# Patient Record
Sex: Female | Born: 1955 | Race: White | Hispanic: No | Marital: Married | Smoking: Never smoker
Health system: Southern US, Community
[De-identification: ages and names within clinical notes are randomized; demographics above are authoritative.]

## PROBLEM LIST (undated history)

## (undated) DIAGNOSIS — I7300 Raynaud's syndrome without gangrene: Secondary | ICD-10-CM

## (undated) DIAGNOSIS — I10 Essential (primary) hypertension: Secondary | ICD-10-CM

## (undated) DIAGNOSIS — R112 Nausea with vomiting, unspecified: Secondary | ICD-10-CM

## (undated) DIAGNOSIS — R000 Tachycardia, unspecified: Secondary | ICD-10-CM

## (undated) DIAGNOSIS — I341 Nonrheumatic mitral (valve) prolapse: Secondary | ICD-10-CM

## (undated) DIAGNOSIS — K220 Achalasia of cardia: Secondary | ICD-10-CM

## (undated) DIAGNOSIS — Z9889 Other specified postprocedural states: Secondary | ICD-10-CM

## (undated) DIAGNOSIS — B029 Zoster without complications: Secondary | ICD-10-CM

## (undated) DIAGNOSIS — E785 Hyperlipidemia, unspecified: Secondary | ICD-10-CM

## (undated) DIAGNOSIS — N281 Cyst of kidney, acquired: Secondary | ICD-10-CM

## (undated) DIAGNOSIS — H33321 Round hole, right eye: Secondary | ICD-10-CM

## (undated) HISTORY — DX: Achalasia of cardia: K22.0

## (undated) HISTORY — PX: SALPINGECTOMY: SHX328

## (undated) HISTORY — PX: EXPLORATORY LAPAROTOMY: SUR591

## (undated) HISTORY — PX: TUBAL LIGATION: SHX77

## (undated) HISTORY — DX: Hyperlipidemia, unspecified: E78.5

## (undated) HISTORY — DX: Zoster without complications: B02.9

## (undated) HISTORY — DX: Nonrheumatic mitral (valve) prolapse: I34.1

## (undated) HISTORY — PX: DILATION AND CURETTAGE OF UTERUS: SHX78

## (undated) HISTORY — DX: Essential (primary) hypertension: I10

---

## 1981-12-18 DIAGNOSIS — K220 Achalasia of cardia: Secondary | ICD-10-CM

## 1981-12-18 HISTORY — DX: Achalasia of cardia: K22.0

## 2005-11-15 ENCOUNTER — Encounter: Payer: Self-pay | Admitting: Internal Medicine

## 2005-11-29 ENCOUNTER — Encounter: Payer: Self-pay | Admitting: Internal Medicine

## 2005-12-18 HISTORY — PX: COLONOSCOPY: SHX174

## 2006-01-30 LAB — HM COLONOSCOPY: HM COLON: NORMAL

## 2009-02-11 ENCOUNTER — Encounter: Payer: Self-pay | Admitting: Internal Medicine

## 2009-04-08 ENCOUNTER — Encounter: Payer: Self-pay | Admitting: Internal Medicine

## 2009-08-10 ENCOUNTER — Encounter (INDEPENDENT_AMBULATORY_CARE_PROVIDER_SITE_OTHER): Payer: Self-pay | Admitting: *Deleted

## 2009-08-17 ENCOUNTER — Ambulatory Visit: Payer: Self-pay | Admitting: Internal Medicine

## 2009-08-17 DIAGNOSIS — M25559 Pain in unspecified hip: Secondary | ICD-10-CM | POA: Insufficient documentation

## 2009-08-17 DIAGNOSIS — N949 Unspecified condition associated with female genital organs and menstrual cycle: Secondary | ICD-10-CM

## 2009-08-17 DIAGNOSIS — M2550 Pain in unspecified joint: Secondary | ICD-10-CM | POA: Insufficient documentation

## 2009-08-17 DIAGNOSIS — L988 Other specified disorders of the skin and subcutaneous tissue: Secondary | ICD-10-CM | POA: Insufficient documentation

## 2009-08-17 DIAGNOSIS — Z9889 Other specified postprocedural states: Secondary | ICD-10-CM | POA: Insufficient documentation

## 2009-08-17 DIAGNOSIS — M25819 Other specified joint disorders, unspecified shoulder: Secondary | ICD-10-CM | POA: Insufficient documentation

## 2009-08-17 DIAGNOSIS — E785 Hyperlipidemia, unspecified: Secondary | ICD-10-CM | POA: Insufficient documentation

## 2009-08-17 DIAGNOSIS — M7580 Other shoulder lesions, unspecified shoulder: Secondary | ICD-10-CM

## 2009-08-17 DIAGNOSIS — N938 Other specified abnormal uterine and vaginal bleeding: Secondary | ICD-10-CM | POA: Insufficient documentation

## 2009-08-17 LAB — CONVERTED CEMR LAB
Ketones, urine, test strip: NEGATIVE
Nitrite: NEGATIVE
Specific Gravity, Urine: <1.005
Urobilinogen, UA: 0.2
pH: 7

## 2009-08-18 LAB — CONVERTED CEMR LAB: Sed Rate: 9 mm/hr (ref 0–22)

## 2009-08-19 ENCOUNTER — Encounter (INDEPENDENT_AMBULATORY_CARE_PROVIDER_SITE_OTHER): Payer: Self-pay | Admitting: *Deleted

## 2009-08-25 ENCOUNTER — Telehealth (INDEPENDENT_AMBULATORY_CARE_PROVIDER_SITE_OTHER): Payer: Self-pay | Admitting: *Deleted

## 2009-08-31 ENCOUNTER — Encounter: Payer: Self-pay | Admitting: Internal Medicine

## 2009-09-08 ENCOUNTER — Encounter: Admission: RE | Admit: 2009-09-08 | Discharge: 2009-09-08 | Payer: Self-pay | Admitting: Orthopedic Surgery

## 2009-09-16 ENCOUNTER — Encounter: Admission: RE | Admit: 2009-09-16 | Discharge: 2009-09-16 | Payer: Self-pay | Admitting: Orthopedic Surgery

## 2009-09-16 ENCOUNTER — Encounter: Payer: Self-pay | Admitting: Internal Medicine

## 2009-11-08 ENCOUNTER — Encounter: Payer: Self-pay | Admitting: Internal Medicine

## 2010-01-04 ENCOUNTER — Telehealth (INDEPENDENT_AMBULATORY_CARE_PROVIDER_SITE_OTHER): Payer: Self-pay | Admitting: *Deleted

## 2010-01-05 ENCOUNTER — Ambulatory Visit: Payer: Self-pay | Admitting: Internal Medicine

## 2010-01-05 DIAGNOSIS — I7300 Raynaud's syndrome without gangrene: Secondary | ICD-10-CM | POA: Insufficient documentation

## 2010-01-05 DIAGNOSIS — I471 Supraventricular tachycardia: Secondary | ICD-10-CM | POA: Insufficient documentation

## 2010-01-06 ENCOUNTER — Telehealth (INDEPENDENT_AMBULATORY_CARE_PROVIDER_SITE_OTHER): Payer: Self-pay | Admitting: *Deleted

## 2010-01-07 LAB — CONVERTED CEMR LAB
BUN: 9 mg/dL (ref 6–23)
Basophils Relative: 1 % (ref 0.0–3.0)
Chloride: 102 meq/L (ref 96–112)
Eosinophils Relative: 3 % (ref 0.0–5.0)
Free T4: 0.9 ng/dL (ref 0.6–1.6)
GFR calc non Af Amer: 79.38 mL/min (ref >60)
Glucose, Bld: 79 mg/dL (ref 70–99)
MCV: 93.6 fL (ref 78.0–100.0)
Monocytes Absolute: 0.2 10*3/uL (ref 0.1–1.0)
Neutrophils Relative %: 54.4 % (ref 43.0–77.0)
Potassium: 4 meq/L (ref 3.5–5.1)
RBC: 4.39 M/uL (ref 3.87–5.11)
WBC: 3.4 10*3/uL — ABNORMAL LOW (ref 4.5–10.5)

## 2010-01-14 ENCOUNTER — Telehealth (INDEPENDENT_AMBULATORY_CARE_PROVIDER_SITE_OTHER): Payer: Self-pay | Admitting: *Deleted

## 2010-01-24 ENCOUNTER — Encounter: Payer: Self-pay | Admitting: Internal Medicine

## 2010-05-20 ENCOUNTER — Ambulatory Visit: Payer: Self-pay | Admitting: Internal Medicine

## 2010-05-20 DIAGNOSIS — H6980 Other specified disorders of Eustachian tube, unspecified ear: Secondary | ICD-10-CM | POA: Insufficient documentation

## 2010-05-20 DIAGNOSIS — L609 Nail disorder, unspecified: Secondary | ICD-10-CM | POA: Insufficient documentation

## 2010-07-26 ENCOUNTER — Ambulatory Visit: Payer: Self-pay | Admitting: Internal Medicine

## 2010-07-26 DIAGNOSIS — Z8679 Personal history of other diseases of the circulatory system: Secondary | ICD-10-CM | POA: Insufficient documentation

## 2010-07-26 DIAGNOSIS — L0291 Cutaneous abscess, unspecified: Secondary | ICD-10-CM | POA: Insufficient documentation

## 2010-07-26 DIAGNOSIS — L0390 Cellulitis, unspecified: Secondary | ICD-10-CM

## 2010-08-17 ENCOUNTER — Telehealth (INDEPENDENT_AMBULATORY_CARE_PROVIDER_SITE_OTHER): Payer: Self-pay | Admitting: *Deleted

## 2010-12-18 DIAGNOSIS — B029 Zoster without complications: Secondary | ICD-10-CM

## 2010-12-18 HISTORY — DX: Zoster without complications: B02.9

## 2011-01-17 NOTE — Progress Notes (Signed)
Summary: refill request  Phone Note Refill Request Message from:  Patient on August 17, 2010 10:47 AM  Refills Requested: Medication #1:  TOPROL XL 25 MG XR24H-TAB 1 by mouth once daily   Dosage confirmed as above?Dosage Confirmed   Supply Requested: 1 month [facility] [address]  Next Appointment Scheduled: none Initial call taken by: Lavell Islam,  August 17, 2010 10:48 AM    Prescriptions: TOPROL XL 25 MG XR24H-TAB (METOPROLOL SUCCINATE) 1 by mouth once daily  #90 Each x 1   Entered by:   Shonna Chock CMA   Authorized by:   Marga Melnick MD   Signed by:   Shonna Chock CMA on 08/17/2010   Method used:   Electronically to        [facility] (retail)       [address]       [place], [place]  [address]       Ph: [phone] or [phone]       Fax: [phone]   RxID:   [id number]

## 2011-01-17 NOTE — Assessment & Plan Note (Signed)
Summary: follow-up on a medication//lch   Vital Signs:  Patient profile:   55 year old female Weight:      144.2 pounds BMI:     23.36 Pulse rate:   64 / minute Resp:     15 per minute BP sitting:   110 / 68  (left arm) Cuff size:   large  Vitals Entered By: Shonna Chock (May 20, 2010 3:41 PM) CC: Follow-up visit: discuss meds  Comments REVIEWED MED LIST, PATIENT AGREED DOSE AND INSTRUCTION CORRECT    CC:  Follow-up visit: discuss meds .  History of Present Illness: She questions changing to different B blocker due to Raynaud's. Raynaud's has been quiescent on low dose CCB, Amlodipine  . PAT symptoms have been minimal  on present Beta blocker & easily  interrupted by cough. She avoids stimulants except Afrin which is used prn for Eustachian Tube Dysfunction symptoms. Options to address the two issues (PAT & Raynaud's) discussed; safety of selective Beta blocker , Metoprolol reviewed. As Raynaud's is quiescent option of D/Cing Amlodipine during Summer discussed.                                                                                                                                                    Reports from Dr Darrelyn Hillock reviewed;L5-S1 DDD , facet arthrosis & tiny annular disc tear documented.  Allergies (verified): No Known Drug Allergies  Past History:  Past Medical History: Hyperlipidemia: TC 227, HDL 81, LDL < 100 by history Perimenopausal; "Cardiospasm" with Tachycardia ,onset with 1st pregnancy, ? borderline MVP , previously  on Lanoxin,  ?  PAT ; ? Raynaud's Syndrome  Review of Systems ENT:  Complains of nasal congestion; denies earache; Occasional pressure due to Esophageal Dysfunction.  Physical Exam  General:  well-nourished,in no acute distress; alert,appropriate and cooperative throughout examination Ears:  External ear exam shows no significant lesions or deformities.  Otoscopic examination reveals clear canals, tympanic membranes are intact bilaterally  without bulging, retraction, inflammation or discharge. Hearing is grossly normal bilaterally.R TM dull Nose:  External nasal examination shows no deformity or inflammation. Nasal mucosa are pink and moist without lesions or exudates. Mouth:  Oral mucosa and oropharynx without lesions or exudates.  Teeth in good repair. Lungs:  Normal respiratory effort, chest expands symmetrically. Lungs are clear to auscultation, no crackles or wheezes. Heart:  Normal rate and regular rhythm. S1 and S2 normal without gallop, murmur, click, rub .S4 Pulses:  R and L carotid,radial,dorsalis pedis and posterior tibial pulses are full and equal bilaterally Extremities:  No clubbing, cyanosis, edema. Extremities cool. Mild horizontal ridging of nails present Neurologic:  alert & oriented X3 and gait normal.   Skin:  Intact without suspicious lesions or rashes. No ischemic digit changes Cervical Nodes:  No lymphadenopathy noted Axillary Nodes:  No palpable lymphadenopathy Psych:  memory intact for recent and remote, normally interactive, and good eye contact.  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 1 by mouth as needed 2)  Toprol Xl 25 Mg Xr24h-tab (Metoprolol succinate) .Marland Kitchen.. 1 by mouth once daily 3)  Amlodipine Besylate 2.5 Mg Tabs (Amlodipine besylate) .Marland Kitchen.. 1 once daily 4)  Fluticasone Propionate 50 Mcg/act Susp (Fluticasone propionate) .Marland Kitchen.. 1 spray two times a day as needed  Patient Instructions: 1)  Neti pot once daily as needed for congestion. Stop Amlodipine over Summer  if  Raynaud's is well controlled.Consider Biotin & B complex for nail health. Stop Afrin ; use Fluticasone two times a day as needed as discussed for ear symptoms. Prescriptions: FLUTICASONE PROPIONATE 50 MCG/ACT SUSP (FLUTICASONE PROPIONATE) 1 spray two times a day as needed  #1 x 11   Entered and Authorized by:   Marga Melnick MD   Signed by:   Marga Melnick MD on 05/20/2010   Method used:   Print then Give to Patient   RxID:   [id number] AMLODIPINE BESYLATE 2.5 MG TABS (AMLODIPINE BESYLATE) 1 once daily  #90 x 3   Entered and Authorized by:   Marga Melnick MD   Signed by:   Marga Melnick MD on 05/20/2010   Method used:   Print then Give to Patient   RxID:   [id number] TOPROL XL 25 MG XR24H-TAB (METOPROLOL SUCCINATE) 1 by mouth once daily  #90 Each x 3   Entered and Authorized by:   Marga Melnick MD   Signed by:   Marga Melnick MD on 05/20/2010   Method used:   Print then Give to Patient   RxID:   [id number]

## 2011-01-17 NOTE — Assessment & Plan Note (Signed)
Summary: infected finger/kdc   Vital Signs:  Patient profile:   55 year old female Weight:      145.6 pounds Pulse rate:   64 / minute Resp:     14 per minute BP sitting:   100 / 60  (left arm) Cuff size:   large  Vitals Entered By: Shonna Chock (January 05, 2010 1:52 PM) CC: Examine fingers-red and painful x several weeks Comments REVIEWED MED LIST, PATIENT AGREED DOSE AND INSTRUCTION CORRECT    CC:  Examine fingers-red and painful x several weeks.  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 Toes to lesser extent have gotten  blue with cold for > 20 years. Finger lesions in 11/2009 as pruritic papules with swelling & redness. Rx: Neosporin , Ichtamythol. Her father had Myasthenia gravis ; no FH of  Collagen Vascular Disease. Never smoked.On Beta blocker from Cardiologist in [place], [place]. PMH of "Thyroid Storm" with abnormal TFTs  Allergies (verified): No Known Drug Allergies  Past History:  Past Medical History: Hyperlipidemia: TC 227, HDL 81, LDL < 100 by history Perimenopausal; "Cardiospasm" with Tachycardia ,onset with 1st pregnancy, ? borderline MVP , prev on Lanoxin,  ?  PAT ; ? Raynaud's Syndrome  Review of Systems General:  Denies chills, fever, sweats, and weight loss. Eyes:  Denies blurring, double vision, and vision loss-both eyes. ENT:  Denies difficulty swallowing and hoarseness. CV:  Complains of bluish discoloration of lips or nails and palpitations; denies chest pain or discomfort, swelling of feet, and swelling of hands; Cough reverses palpitations. MS:  Complains of joint pain, joint redness, joint swelling, and stiffness; denies low back pain, mid back pain, muscle aches, muscle weakness, and thoracic pain. Derm:  Complains of changes in color of skin and itching; denies poor wound healing.  Physical Exam  General:  Thin but well-nourished,in no acute distress;  alert,appropriate and cooperative throughout examination Neck:  No deformities, masses, or tenderness noted. Heart:  Normal rate and regular rhythm. S1 and S2 normal without gallop, murmur,  rub . Click present Abdomen:  Bowel sounds positive,abdomen soft and non-tender without masses, organomegaly or hernias noted. Brisk aortic bruit  w/o AAA Pulses:  R and L carotid,radial and posterior tibial pulses are full and equal bilaterally. Decreased DPP . Extremities:  Cyanosis  of toes; slight nail clubbing suggested.  All digits cool. Cool erythema @ nail bed L index & 5th R & 5th PIP Neurologic:  alert & oriented X3 and DTRs symmetrical and normal.   Skin:  see digits Cervical Nodes:  No lymphadenopathy noted Axillary Nodes:  No palpable lymphadenopathy Psych:  memory intact for recent and remote, normally interactive, and good eye contact.     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 Her updated medication list for this problem includes:    Toprol Xl 25 Mg Xr24h-tab (Metoprolol succinate) .Marland Kitchen... 1 by mouth once daily  Orders: Venipuncture ([address]) TLB-BMP (Basic Metabolic Panel-BMET) (80048-METABOL) TLB-TSH (Thyroid Stimulating Hormone) (84443-TSH) TLB-T4 (Thyrox), Free [phone])  Complete Medication List: 1)  Xanax 0.25 Mg Tabs (Alprazolam) .Marland Kitchen.. 1 by mouth as needed 2)  Toprol Xl 25  Mg Xr24h-tab (Metoprolol succinate) .Marland Kitchen.. 1 by mouth once daily 3)  Vitamin D3 2000 Unit Caps (Cholecalciferol) .Marland Kitchen.. 1 by mouth once daily 4)  Amlodipine Besylate 2.5 Mg Tabs (Amlodipine  besylate) .Marland Kitchen.. 1 once daily  Patient Instructions: 1)  Silk liners AND wool mittens & socks for hand protection are essential to prevent tissue damage. Prescriptions: AMLODIPINE BESYLATE 2.5 MG TABS (AMLODIPINE BESYLATE) 1 once daily  #30 x 5   Entered and Authorized by:   Marga Melnick MD   Signed by:   Marga Melnick MD on 01/05/2010   Method used:   Print then Give to Patient   RxID:   [id number]   Appended Document: infected finger/kdc  Laboratory Results   Urine Tests   Date/Time Reported: January 05, 2010 3:04 PM   Routine Urinalysis   Color: yellow Appearance: Clear Glucose: negative   (Normal Range: Negative) Bilirubin: negative   (Normal Range: Negative) Ketone: negative   (Normal Range: Negative) Spec. Gravity: <1.005   (Normal Range: 1.003-1.035) Blood: trace-intact   (Normal Range: Negative) pH: 6.5   (Normal Range: 5.0-8.0) Protein: negative   (Normal Range: Negative) Urobilinogen: negative   (Normal Range: 0-1) Nitrite: negative   (Normal Range: Negative) Leukocyte Esterace: negative   (Normal Range: Negative)    Comments: Floydene Flock  January 05, 2010 3:04 PM menses... normal prot.

## 2011-01-17 NOTE — Progress Notes (Signed)
Summary: Refill request  Phone Note Refill Request Message from:  Patient  Refills Requested: Medication #1:  AMLODIPINE BESYLATE 2.5 MG TABS 1 once daily. [facility]   Method Requested: Electronic Initial call taken by: Shonna Chock,  January 14, 2010 11:54 AM    Prescriptions: AMLODIPINE BESYLATE 2.5 MG TABS (AMLODIPINE BESYLATE) 1 once daily  #90 x 2   Entered by:   Shonna Chock   Authorized by:   Marga Melnick MD   Signed by:   Shonna Chock on 01/14/2010   Method used:   Faxed to ...       [facility])       [address] [address]       [place], [place]  [address]       Ph: [phone]       Fax: [phone]   RxID:   [id number]

## 2011-01-17 NOTE — Assessment & Plan Note (Signed)
Summary: infection / nose/cbs   Vital Signs:  Patient profile:   55 year old female Weight:      145 pounds Temp:     98.3 degrees F oral Pulse rate:   64 / minute BP sitting:   116 / 60  (left arm) Cuff size:   regular  Vitals Entered By: Shonna Chock CMA (July 26, 2010 12:03 PM) CC: 1.) ? Infection, patient had two whiteheads in nosed, they popped and now throbbing and swelling on face  2.) Left leg feels tight, longer than 1 month Comments ** Patient had advil about 8:00am**   CC:  1.) ? Infection, patient had two whiteheads in nosed, they popped and now throbbing and swelling on face  2.) Left leg feels tight, and longer than 1 month.  History of Present Illness: She noted throbbing in R nare last night ; she saw 2 whiteheads ; 1 drained with  alcohol swabs. Also she applied Neosporin. This am 1 recurred & she noted swelling & tenderness OD area medially. No PMH of MRSA. Additionally LLE "tight"  Current Medications (verified): 1)  Xanax 0.25 Mg Tabs (Alprazolam) .Marland Kitchen.. 1 By Mouth As Needed 2)  Toprol Xl 25 Mg Xr24h-Tab (Metoprolol Succinate) .Marland Kitchen.. 1 By Mouth Once Daily 3)  Amlodipine Besylate 2.5 Mg Tabs (Amlodipine Besylate) .Marland Kitchen.. 1 Once Daily, Seasonal (Winter)  Allergies (verified): No Known Drug Allergies  Past History:  Past Medical History: Hyperlipidemia: TC 227, HDL 81, LDL < 100 by history Perimenopausal; "Cardiospasm" with Tachycardia ,onset with 1st pregnancy, ? borderline MVP , previously  on Lanoxin,  PMH of ? PSVT, Cardiology evaluation [address], [place]: negative  Bruce Stress Test, mild Mitral Valve Prolapse ( anterior leaflet) ; ? Raynaud's Syndrome  Review of Systems General:  Complains of sweats; denies chills and fever; Menopausal. ENT:  Complains of postnasal drainage; denies ear discharge, earache, nasal congestion, and sore throat; No frontal headache , facial pain or purulence. CV:  Denies chest pain or discomfort. Resp:  Denies chest pain with  inspiration, coughing up blood, and shortness of breath.  Physical Exam  General:  well-nourished,in no acute distress; alert,appropriate and cooperative throughout examination Eyes:  No corneal or conjunctival inflammation noted. EOMI. Perrla. Vision grossly normal. Ears:  External ear exam shows no significant lesions or deformities.  Otoscopic examination reveals clear canals, tympanic membranes are intact bilaterally without bulging, retraction, inflammation or discharge. Hearing is grossly normal bilaterally. Nose:  External nasal examination shows no deformity or inflammation. R nasal mucosa  erythematous Mouth:  Oral mucosa and oropharynx without lesions or exudates.  Teeth in good repair. Heart:  Normal rate and regular rhythm. S1 and S2 normal without gallop, murmur, click, rub. S4 suggested @ apex Pulses:  R and L dorsalis pedis and posterior tibial pulses are full and equal bilaterally Extremities:  No clubbing, cyanosis, edema. Neg Homan's Skin:  Intact without suspicious lesions or rashes Cervical Nodes:  No lymphadenopathy noted Axillary Nodes:  No palpable lymphadenopathy   Impression & Recommendations:  Problem # 1:  CELLULITIS (ICD-682.9)  Nasal  Her updated medication list for this problem includes:    Amoxicillin-pot Clavulanate 500-125 Mg Tabs (Amoxicillin-pot clavulanate) .Marland Kitchen... 1 two times a day with a meal  Problem # 2:  MITRAL VALVE PROLAPSE, HX OF (ICD-V12.50) Mild as per ECHO   Complete Medication List: 1)  Xanax 0.25 Mg Tabs (Alprazolam) .Marland Kitchen.. 1 by mouth as needed 2)  Toprol Xl 25 Mg Xr24h-tab (Metoprolol succinate) .Marland KitchenMarland KitchenMarland Kitchen 1  by mouth once daily 3)  Amlodipine Besylate 2.5 Mg Tabs (Amlodipine besylate) .Marland Kitchen.. 1 once daily, seasonal (winter) 4)  Amoxicillin-pot Clavulanate 500-125 Mg Tabs (Amoxicillin-pot clavulanate) .Marland Kitchen.. 1 two times a day with a meal 5)  Bactroban 2 % Crea (Mupirocin calcium) .... Apply two times a day  Patient Instructions: 1)  Report  Warning Signs as discussed. Prescriptions: BACTROBAN 2 % CREA (MUPIROCIN CALCIUM) apply two times a day  #15 grams x 0   Entered and Authorized by:   Marga Melnick MD   Signed by:   Marga Melnick MD on 07/26/2010   Method used:   Faxed to ...       [facility] [address] (retail)       [address] [place], [place]  [address]       Ph: [phone]       Fax: [phone]   RxID:   [id number] AMOXICILLIN-POT CLAVULANATE 500-125 MG TABS (AMOXICILLIN-POT CLAVULANATE) 1 two times a day with a meal  #14 x 0   Entered and Authorized by:   Marga Melnick MD   Signed by:   Marga Melnick MD on 07/26/2010   Method used:   Faxed to ...       [facility] [address] (retail)       [address] [place], [place]  [address]       Ph: [phone]       Fax: [phone]   RxID:   [id number]

## 2011-01-17 NOTE — Progress Notes (Signed)
Summary: Additional concerns  Phone Note Call from Patient Call back at Home Phone [phone]   Caller: Patient Summary of Call: Message left on VM: patient would like to know if Dr.Hopper reviewed the Cat Scan of her chest and if he thinks she needs to continue Norvasc.   Dr.Hopper please advise./Chrae [facility]  January 06, 2010 9:22 AM   Follow-up for Phone Call        I did not receive CT scan from Dr Darrelyn Hillock. Amlodipine is to improve circulation & prevent Raynaud's Follow-up by: Marga Melnick MD,  January 06, 2010 10:27 AM  Additional Follow-up for Phone Call Additional follow up Details #1::        Spoke with patient, patient aware to continue Amlodipine and I will follow-up on CT. Patient aware I will call her if Dr.Hopper's have any concerns once CT reviewed.  I called Dr.Gioffre's office and requested CT, spoke with Joy. Report to be faxed.  Additional Follow-up by: Shonna Chock,  January 06, 2010 10:35 AM

## 2011-01-17 NOTE — Progress Notes (Signed)
Summary: refill  Phone Note Refill Request Message from:  [facility] [phone]  Refills Requested: Medication #1:  TOPROL XL 25 MG XR24H-TAB 1 by mouth once daily Initial call taken by: Barb Merino,  January 04, 2010 9:27 AM    Prescriptions: TOPROL XL 25 MG XR24H-TAB (METOPROLOL SUCCINATE) 1 by mouth once daily  #90 x 2   Entered by:   Shonna Chock   Authorized by:   Marga Melnick MD   Signed by:   Shonna Chock on 01/04/2010   Method used:   Printed then faxed to ...         RxID:   [id number]

## 2011-01-17 NOTE — Letter (Signed)
Summary: [facility]  [facility]   Imported By: Lanelle Bal 01/14/2010 12:44:16  _____________________________________________________________________  External Attachment:    Type:   Image     Comment:   External Document

## 2011-04-21 ENCOUNTER — Other Ambulatory Visit: Payer: Self-pay | Admitting: Internal Medicine

## 2011-07-13 ENCOUNTER — Other Ambulatory Visit: Payer: Self-pay | Admitting: Internal Medicine

## 2011-07-13 MED ORDER — METOPROLOL SUCCINATE ER 25 MG PO TB24: 25.0000 mg | ORAL_TABLET | Freq: Every day | ORAL | Status: DC

## 2011-07-13 NOTE — Telephone Encounter (Signed)
Patient Due for CPX in Aug, rx sent to pharmacy

## 2011-08-04 ENCOUNTER — Ambulatory Visit (INDEPENDENT_AMBULATORY_CARE_PROVIDER_SITE_OTHER): Payer: BC Managed Care – PPO | Admitting: Internal Medicine

## 2011-08-04 ENCOUNTER — Encounter: Payer: Self-pay | Admitting: Internal Medicine

## 2011-08-04 VITALS — BP 124/63 | HR 73 | Ht 66.0 in | Wt 142.0 lb

## 2011-08-04 DIAGNOSIS — E559 Vitamin D deficiency, unspecified: Secondary | ICD-10-CM

## 2011-08-04 DIAGNOSIS — Z0000 Encounter for general adult medical examination without abnormal findings: Secondary | ICD-10-CM

## 2011-08-04 DIAGNOSIS — M8580 Other specified disorders of bone density and structure, unspecified site: Secondary | ICD-10-CM | POA: Insufficient documentation

## 2011-08-04 DIAGNOSIS — M949 Disorder of cartilage, unspecified: Secondary | ICD-10-CM

## 2011-08-04 DIAGNOSIS — M899 Disorder of bone, unspecified: Secondary | ICD-10-CM

## 2011-08-04 DIAGNOSIS — E785 Hyperlipidemia, unspecified: Secondary | ICD-10-CM

## 2011-08-04 NOTE — Progress Notes (Signed)
Subjective:    Patient ID: Tiffany Moran, female    DOB: 1956-02-22, 55 y.o.   MRN: [id number]  HPI  Mrs. Rahilly  is here for a physical;acute issues include pain in her head. Onset: recurrence 3 weeks ago   Location: [place]   Quality: "searing" Duration : 1& 1/2 days Precipitating factors: no  Prior treatment: hydration , NSAIDS Associated Symptoms Nausea/vomiting: no Photophobia/phonophobia: no  Tearing of eyes: no  Family hx migraine: yes, father  Red Flags Fever: no  Neck pain/stiffness: no  Vision/speech/swallow/hearing difficulty: no  Focal weakness/numbness: no  Altered mental status: no  New type of headache: no, "a couple of times / year for  2-3 years"  PMH of Temporal Arteritis   Review of Systems  Patient reports no vision/ hearing  changes, adenopathy,fever, weight change,  persistant / recurrent hoarseness , swallowing issues, chest pain,palpitations,edema,persistant /recurrent cough, hemoptysis, dyspnea( rest/ exertional/paroxysmal nocturnal), gastrointestinal bleeding(melena, rectal bleeding), abdominal pain, significant heartburn,  bowel changes,GU symptoms(dysuria, hematuria,pyuria, incontinence), Gyn symptoms of abnormal  bleeding or  pain,  syncope, focal weakness, memory loss,numbness & tingling, skin/hair /nail changes,abnormal bruising or bleeding, anxiety,or depression. Bloating has been a chronic problem; no ovarian issues were noted at her gynecologic exam in April of this year.     Objective:   Physical Exam Gen.: Thin but healthy and well-nourished in appearance. Alert, appropriate and cooperative throughout exam. Head: Normocephalic without obvious abnormalities Eyes: No corneal or conjunctival inflammation noted. Pupils equal round reactive to light and accommodation. Fundal exam is benign without hemorrhages, exudate, papilledema. Extraocular motion intact. Vision grossly normal with lenses. Ears: External  ear exam reveals no significant lesions or  deformities. Canals clear .TMs normal. Hearing is grossly normal bilaterally. Nose: External nasal exam reveals no deformity or inflammation. Nasal mucosa are pink and moist. No lesions or exudates noted.Mouth: Oral mucosa and oropharynx reveal no lesions or exudates. Teeth in good repair. Neck: No deformities, masses, or tenderness noted. Range of motion & . Thyroid normal. Lungs: Normal respiratory effort; chest expands symmetrically. Lungs are clear to auscultation without rales, wheezes, or increased work of breathing. Heart: Normal rate and rhythm. Normal S1 and S2. No gallop,  or rub. Classic click @ apex ; no murmur. Abdomen: Bowel sounds normal; abdomen soft and nontender. No masses, organomegaly or hernias noted. Genitalia: as per Gyn   .                                                                                   Musculoskeletal/extremities: No deformity or scoliosis noted of  the thoracic or lumbar spine. No clubbing, cyanosis, edema, or deformity noted. Range of motion  normal .Tone & strength  normal.Joints normal. Nail health  good. Vascular: Carotid, radial artery, dorsalis pedis and  posterior tibial pulses are full and equal. No bruits present. Neurologic: Alert and oriented x3. Deep tendon reflexes symmetrical and normal.          Skin: Intact without suspicious lesions or rashes. Lymph: No cervical, axillary  lymphadenopathy present. Psych: Mood and affect are normal. Normally interactive  Assessment & Plan:  #1 comprehensive physical exam; no acute findings #2 see Problem List with Assessments & Recommendations  #3 intermittent myalgias as described above  #4 bloating, negative gynecologic exam  #5 vitamin D deficiency, on high-dose replacement  #6 dyslipidemia, questionable risk. Father had heart attack at 65.  Plan: see Orders.Advanced  cholesterol testing is indicated to  assess the LDL of 169. Vitamin D level should be rechecked.

## 2011-08-04 NOTE — Patient Instructions (Addendum)
Risk of premature heart attack or stroke increases as LDL or BAD cholesterol rises.Advanced cholesterol panels optimally determine risk base on particle composition ( NMR Lipoprofile ) or by assessing multiple other genetic risks([facility] Heart Panel 1304X). These are indicated when LDL is > 130, especially if there is family history of heart attack in males before 77 or women before 2  Please review Dr Gildardo Griffes book Eat, Drink & Be Healthy for dietary cholesterol information. Please  schedule fasting Labs : NMR Lipoprofile Lipid Panel; vit D level ( 272.4, V17.3, 268.9). Normal T scores on a bone density exam (BMD)  are +1 to -1. Osteopenia would be -1.1 to -2.4. Osteoporosis is defined by a  T score worse than -2.4. Treatment should be considered  with  T scores worse than -1.5, particularly if there is  family history of low bone density or personal  past history of atypical fractue.BMD should be monitored every 25 months. Recommended lifestyle interventions to prevent  Osteoporosis include calcium 600 mg twice a day  & vitamin D3 supplementation to keep vitamin  D  level @ least 40-60. The usual vitamin D3 dose is 1000 IU daily; but individual dose is determined by annual vitamin D level monitor. Also weight bearing exercise such as  walking 30-45 minutes 3-4  X per week is recommended.  Please keep a diary of your headaches . Document  each occurrence on the calendar with notation of : #1 any prodrome ( any non headache symptom such as marked fatigue,visual changes, ,etc ) which precedes actual headache ; #2) severity on 1-10 scale; #3) any triggers ( food/ drink,enviromenntal or weather changes ,physical or emotional stress) in 8-12 hour period prior to the headache; & #4) response to any medications or other intervention. Please review "Headache" @ [facility] for additional information.

## 2011-08-10 ENCOUNTER — Encounter: Payer: Self-pay | Admitting: Internal Medicine

## 2011-08-10 ENCOUNTER — Other Ambulatory Visit: Payer: Self-pay | Admitting: Internal Medicine

## 2011-08-10 ENCOUNTER — Other Ambulatory Visit (INDEPENDENT_AMBULATORY_CARE_PROVIDER_SITE_OTHER): Payer: BC Managed Care – PPO

## 2011-08-10 DIAGNOSIS — E785 Hyperlipidemia, unspecified: Secondary | ICD-10-CM

## 2011-08-10 DIAGNOSIS — E559 Vitamin D deficiency, unspecified: Secondary | ICD-10-CM

## 2011-08-10 NOTE — Progress Notes (Signed)
Labs only

## 2011-08-11 LAB — VITAMIN D 25 HYDROXY (VIT D DEFICIENCY, FRACTURES): Vit D, 25-Hydroxy: 50 ng/mL (ref 30–89)

## 2011-08-11 LAB — NMR, LIPOPROFILE

## 2011-08-17 ENCOUNTER — Telehealth: Payer: Self-pay | Admitting: Internal Medicine

## 2011-08-17 NOTE — Telephone Encounter (Signed)
Spoke with patient, discussed Vit D level, informed patient NMR (cholesterol profile will be mailed separately-once received), patient asked if she should continue rx'ed vit d 50,000.  Per Dr.Hopper value is normal, patient to d/c rx'ed vit D and take OTC vit D3 2000 once daily and recheck level in 4-6 months . Scheduled appointment for 12/2010

## 2011-08-18 ENCOUNTER — Encounter: Payer: Self-pay | Admitting: Internal Medicine

## 2011-08-30 ENCOUNTER — Encounter: Payer: Self-pay | Admitting: Internal Medicine

## 2011-10-10 ENCOUNTER — Other Ambulatory Visit: Payer: Self-pay | Admitting: Internal Medicine

## 2011-11-30 ENCOUNTER — Encounter: Payer: Self-pay | Admitting: Family Medicine

## 2011-11-30 ENCOUNTER — Ambulatory Visit (INDEPENDENT_AMBULATORY_CARE_PROVIDER_SITE_OTHER): Payer: BC Managed Care – PPO | Admitting: Family Medicine

## 2011-11-30 VITALS — BP 110/75 | HR 84 | Temp 98.2°F | Ht 66.0 in | Wt 144.8 lb

## 2011-11-30 DIAGNOSIS — B029 Zoster without complications: Secondary | ICD-10-CM | POA: Insufficient documentation

## 2011-11-30 MED ORDER — VALACYCLOVIR HCL 1 G PO TABS: 1000.0000 mg | ORAL_TABLET | Freq: Three times a day (TID) | ORAL | Status: AC

## 2011-11-30 NOTE — Assessment & Plan Note (Signed)
Pt's rash is consistent w/ shingles.  Start Valtrex.  Reviewed supportive care and red flags that should prompt return.  Pt expressed understanding and is in agreement w/ plan.

## 2011-11-30 NOTE — Progress Notes (Signed)
  Subjective:    Patient ID: Tiffany Moran, female    DOB: 07/10/1956, 55 y.o.   MRN: [id number]  HPI Rash on back on leg- noticed Saturday while bathing.  L leg.  Multiple spots surrounded by pink 'and it burns'.  Has taken Amox she had leftover   Review of Systems For ROS see HPI     Objective:   Physical Exam  Vitals reviewed. Constitutional: She appears well-developed and well-nourished. No distress.  Skin: Skin is warm and dry. Rash (cluster of scabbed, unroofed vesicles just below L gluteal crease, cluster of small vesicles on posterior mid-thigh and just above knee) noted.          Assessment & Plan:

## 2011-11-30 NOTE — Patient Instructions (Signed)

## 2011-12-07 ENCOUNTER — Telehealth: Payer: Self-pay

## 2011-12-07 NOTE — Telephone Encounter (Signed)
Message left on voicemail: patient with irregular heartbeats and would like a rx for xanax sent in, patient states this is related to stress from recently having the shingles. Patient notes that she has a pending appointment next week.  I called and spoke with patient and informed her to seek medical attention at the emergency room. 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 Patient has not seen a cardiologist in 2 years. Patient would like an appointment to be seen here tomorrow.  Appointment schedule for tomorrow with Dr.Paz (Dr.Hopper's schedule was already full), I re-advised patient to seek medical attention in ER if any change in symptoms. 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

## 2011-12-08 ENCOUNTER — Ambulatory Visit (INDEPENDENT_AMBULATORY_CARE_PROVIDER_SITE_OTHER): Payer: BC Managed Care – PPO | Admitting: Internal Medicine

## 2011-12-08 VITALS — BP 120/98 | HR 73 | Temp 98.0°F | Ht 65.5 in | Wt 142.0 lb

## 2011-12-08 DIAGNOSIS — I471 Supraventricular tachycardia: Secondary | ICD-10-CM

## 2011-12-08 DIAGNOSIS — R002 Palpitations: Secondary | ICD-10-CM

## 2011-12-08 DIAGNOSIS — Z8679 Personal history of other diseases of the circulatory system: Secondary | ICD-10-CM

## 2011-12-08 MED ORDER — ALPRAZOLAM 0.5 MG PO TBDP: 0.5000 mg | ORAL_TABLET | Freq: Three times a day (TID) | ORAL | Status: DC | PRN

## 2011-12-08 MED ORDER — ALPRAZOLAM 0.5 MG PO TABS: 0.5000 mg | ORAL_TABLET | Freq: Three times a day (TID) | ORAL | Status: AC | PRN

## 2011-12-08 NOTE — Patient Instructions (Signed)
Please came back next week for labs: CMP-CBC -TSH---- dx MVP, palpitations ER if symptoms severe or chest pain, feeling faint , short of breath Call if no better in few days

## 2011-12-08 NOTE — Assessment & Plan Note (Addendum)
Patient with a history of MVP PSVT per cardiology note from 2 years ago presents with increased palpitations. EKG normal-at baseline, she does not have any  red flag sx She wonders if symptoms related to "perimenopause" but denies mood swings and has mild hot flashes sometimes  We discussed options: Increase BB (declined, BP usually much lower than today) Refer to cards, declined  Told patient that sometimes SSRIs are beneficial for perimenopausal symptoms and fpr anxiety but again she states that she is not anxious , just admits to some stress She requested a trial w/ xanax prn and likes to see cards if that does not work I agreed to that,  ER if she has severe symptoms and to keep me notify on how she is doing

## 2011-12-08 NOTE — Assessment & Plan Note (Deleted)
palpiattions increased lately EKG normal, no red flag sx She wonders if related to "perimenopause" but denies mood swings but has mild hot flashes sometimes  We discussed options: Increase BB (declined, BP usually much lower than today) Refer to cards, declined  SSRIs? States that she is not really anxious but stressed out  She requested a trial w/ xanax prn and likes to see cards if that does not work

## 2011-12-08 NOTE — Progress Notes (Signed)
  Subjective:    Patient ID: Tiffany Moran, female    DOB: [birth year], 55 y.o.   MRN: [id number]  HPI Seen accurately today, she has a long history of palpitations, on metoprolol since 1993, has seen several cardiologists; in the past workup has included stress test and echocardiograms. For the last 2 or 3 weeks, palpitations have been more frequent, essentially having symptoms every day, heart rate described as rapid and irregular "skipping heart beats". She admits to some stress but no anxiety per se. She's not taking any new medications. Has not increased her caffeine intake.  Past Medical History  Diagnosis Date  . Hyperlipidemia    Past Surgical History  Procedure Date  . Tubal ligation   . Exploratory laparotomy     X2 for pain: fibroid, enlarged oviduct      Review of Systems Denies chest pain or shortness of breath No GERD symptoms No recent airplane trip, left swelling or calf pain. No nausea, vomiting, diarrhea. She was recently diagnosed with shingles.     Objective:   Physical Exam  Constitutional: She is oriented to person, place, and time. She appears well-developed and well-nourished. No distress.  Neck: No thyromegaly present.  Cardiovascular: Normal rate, regular rhythm and normal heart sounds.   No murmur heard.      No click  Pulmonary/Chest: Effort normal and breath sounds normal. No respiratory distress. She has no wheezes. She has no rales.  Musculoskeletal: She exhibits no edema.  Neurological: She is alert and oriented to person, place, and time.  Skin: She is not diaphoretic.  Psychiatric: She has a normal mood and affect. Her behavior is normal. Judgment and thought content normal.       Assessment & Plan:  Today , I spent more than 30  min with the patient, >50% of the time counseling

## 2011-12-11 ENCOUNTER — Other Ambulatory Visit (INDEPENDENT_AMBULATORY_CARE_PROVIDER_SITE_OTHER): Payer: BC Managed Care – PPO

## 2011-12-11 ENCOUNTER — Encounter: Payer: Self-pay | Admitting: Internal Medicine

## 2011-12-11 DIAGNOSIS — R002 Palpitations: Secondary | ICD-10-CM

## 2011-12-11 DIAGNOSIS — I471 Supraventricular tachycardia: Secondary | ICD-10-CM

## 2011-12-11 DIAGNOSIS — Z8679 Personal history of other diseases of the circulatory system: Secondary | ICD-10-CM

## 2011-12-11 LAB — CBC WITH DIFFERENTIAL/PLATELET
Basophils Relative: 0.3 % (ref 0.0–3.0)
Eosinophils Absolute: 0.1 10*3/uL (ref 0.0–0.7)
Lymphs Abs: 1.6 10*3/uL (ref 0.7–4.0)
MCHC: 34.2 g/dL (ref 30.0–36.0)
MCV: 91.3 fl (ref 78.0–100.0)
Monocytes Absolute: 0.2 10*3/uL (ref 0.1–1.0)
Neutrophils Relative %: 55.8 % (ref 43.0–77.0)
Platelets: 163 10*3/uL (ref 150.0–400.0)

## 2011-12-11 LAB — COMPREHENSIVE METABOLIC PANEL
ALT: 19 U/L (ref 0–35)
AST: 20 U/L (ref 0–37)
Albumin: 4.4 g/dL (ref 3.5–5.2)
Alkaline Phosphatase: 77 U/L (ref 39–117)
Potassium: 4.1 mEq/L (ref 3.5–5.1)
Sodium: 141 mEq/L (ref 135–145)
Total Protein: 7.6 g/dL (ref 6.0–8.3)

## 2011-12-11 LAB — TSH: TSH: 1.87 u[IU]/mL (ref 0.35–5.50)

## 2011-12-12 LAB — VITAMIN D 25 HYDROXY (VIT D DEFICIENCY, FRACTURES): Vit D, 25-Hydroxy: 34 ng/mL (ref 30–89)

## 2011-12-13 ENCOUNTER — Encounter: Payer: Self-pay | Admitting: Internal Medicine

## 2011-12-14 ENCOUNTER — Ambulatory Visit: Payer: BC Managed Care – PPO | Admitting: Internal Medicine

## 2011-12-22 ENCOUNTER — Telehealth: Payer: Self-pay

## 2011-12-22 NOTE — Telephone Encounter (Signed)
Message left on voicemail: patient would like a referral to a cardiologist, patient not established with one since moving here   Dr.Hopper please advise

## 2011-12-25 ENCOUNTER — Other Ambulatory Visit: Payer: Self-pay | Admitting: Internal Medicine

## 2011-12-25 DIAGNOSIS — R002 Palpitations: Secondary | ICD-10-CM

## 2011-12-26 NOTE — Telephone Encounter (Signed)
Referral was placed 

## 2012-01-03 ENCOUNTER — Encounter: Payer: Self-pay | Admitting: Cardiovascular Disease

## 2012-01-03 ENCOUNTER — Ambulatory Visit (INDEPENDENT_AMBULATORY_CARE_PROVIDER_SITE_OTHER): Payer: BC Managed Care – PPO | Admitting: Cardiovascular Disease

## 2012-01-03 DIAGNOSIS — I059 Rheumatic mitral valve disease, unspecified: Secondary | ICD-10-CM

## 2012-01-03 DIAGNOSIS — I341 Nonrheumatic mitral (valve) prolapse: Secondary | ICD-10-CM

## 2012-01-03 DIAGNOSIS — E785 Hyperlipidemia, unspecified: Secondary | ICD-10-CM

## 2012-01-03 DIAGNOSIS — I471 Supraventricular tachycardia: Secondary | ICD-10-CM

## 2012-01-03 NOTE — Assessment & Plan Note (Signed)
Benign no need for further w/u  Continue beta blocker.  Event monitor if sustained symptoms start to occur daily

## 2012-01-03 NOTE — Progress Notes (Signed)
56 yo long standing history of palpitations and 'MVP"  Reviewed echo from [facility] 2006 and no real prolapse and no MR.  Palpitations date back to at least 86.  Describes one episode of ? Vagal maneurvers and adenosine.  She also describes cardiospasm.  She is overly concerned with her heart.  She has been on beta blockers for a long time.  Palpitations are worse over last few months with menapause.  Recent episode of shingles behind left knee also stressful.  No syncope.  No SSCP or dyspnea Walks regularly and palpitations not worse with activity.  Compliant with meds and no known thyroid disease  ROS: Denies fever, malais, weight loss, blurry vision, decreased visual acuity, cough, sputum, SOB, hemoptysis, pleuritic pain, palpitaitons, heartburn, abdominal pain, melena, lower extremity edema, claudication, or rash.  All other systems reviewed and negative   General: Affect appropriate Healthy:  appears stated age HEENT: normal Neck supple with no adenopathy JVP normal no bruits no thyromegaly Lungs clear with no wheezing and good diaphragmatic motion Heart:  S1/S2 no murmur,rub, gallop or click PMI normal Abdomen: benighn, BS positve, no tenderness, no AAA no bruit.  No HSM or HJR Distal pulses intact with no bruits No edema Neuro non-focal Skin warm and dry No muscular weakness  Medications Current Outpatient Prescriptions  Medication Sig Dispense Refill  . ALPRAZolam (XANAX) 0.5 MG tablet Take 1 tablet (0.5 mg total) by mouth 3 (three) times daily as needed for sleep.  21 tablet  0  . Cholecalciferol (VITAMIN D3) 2000 UNITS TABS Take by mouth daily.        . metoprolol succinate (TOPROL-XL) 25 MG 24 hr tablet TAKE 1 TABLET BY MOUTH DAILY  90 tablet  1    Allergies Review of patient's allergies indicates no known allergies.  Family History: Family History  Problem Relation Age of Onset  . Heart attack Father 62  . Myasthenia gravis Father   . Hypertension Father   .  Hyperlipidemia Father   . Osteoporosis Father     due to steroids for Myasthenia Grvis  . Leukemia Mother   . Hyperlipidemia Mother   . Hypertension Mother   . Cancer Mother     leukemia  . Thyroid disease Mother   . Osteoporosis Mother   . Cancer Brother     prostate    Social History: History   Social History  . Marital Status: Married    Spouse Name: N/A    Number of Children: N/A  . Years of Education: N/A   Occupational History  . Not on file.   Social History Main Topics  . Smoking status: Never Smoker   . Smokeless tobacco: Never Used  . Alcohol Use: No  . Drug Use: Not on file  . Sexually Active: Not on file   Other Topics Concern  . Not on file   Social History Narrative  . No narrative on file    Electrocardiogram:  12/08/11  NSR normal ECG  Assessment and Plan

## 2012-01-03 NOTE — Assessment & Plan Note (Signed)
F/U Hopper ? Red yeast rice.  And diet RX

## 2012-01-03 NOTE — Patient Instructions (Signed)
Your physician recommends that you schedule a follow-up appointment in: AS NEEDED  Your physician recommends that you continue on your current medications as directed. Please refer to the Current Medication list given to you today.  

## 2012-01-03 NOTE — Assessment & Plan Note (Signed)
She does not have true MVP.  Normal exam with no murmur.  No need for echo

## 2012-01-17 ENCOUNTER — Other Ambulatory Visit: Payer: BC Managed Care – PPO

## 2012-04-12 ENCOUNTER — Other Ambulatory Visit: Payer: Self-pay | Admitting: Internal Medicine

## 2012-04-12 MED ORDER — METOPROLOL SUCCINATE ER 25 MG PO TB24: ORAL_TABLET | ORAL | Status: DC

## 2012-04-12 NOTE — Telephone Encounter (Signed)
refill for Metoprolol Succinate 25MG  TER Take 1-tablet by mouth daily  Qty 90 Last filled 1.23.13  Last OV 1.16.2013

## 2012-05-23 ENCOUNTER — Other Ambulatory Visit: Payer: Self-pay | Admitting: Radiology

## 2012-06-14 ENCOUNTER — Other Ambulatory Visit: Payer: BC Managed Care – PPO

## 2012-09-23 ENCOUNTER — Other Ambulatory Visit: Payer: Self-pay | Admitting: Internal Medicine

## 2012-09-24 NOTE — Telephone Encounter (Signed)
Patient needs to schedule a CPX with Dr.Hopper  

## 2012-12-19 ENCOUNTER — Other Ambulatory Visit: Payer: Self-pay | Admitting: Internal Medicine

## 2012-12-25 ENCOUNTER — Encounter: Payer: Self-pay | Admitting: Internal Medicine

## 2012-12-25 ENCOUNTER — Ambulatory Visit (INDEPENDENT_AMBULATORY_CARE_PROVIDER_SITE_OTHER): Payer: BC Managed Care – PPO | Admitting: Internal Medicine

## 2012-12-25 VITALS — BP 118/80 | HR 75 | Temp 98.1°F | Resp 12 | Ht 66.0 in | Wt 141.6 lb

## 2012-12-25 DIAGNOSIS — M8580 Other specified disorders of bone density and structure, unspecified site: Secondary | ICD-10-CM

## 2012-12-25 DIAGNOSIS — Z8679 Personal history of other diseases of the circulatory system: Secondary | ICD-10-CM

## 2012-12-25 DIAGNOSIS — Z0000 Encounter for general adult medical examination without abnormal findings: Secondary | ICD-10-CM

## 2012-12-25 DIAGNOSIS — M899 Disorder of bone, unspecified: Secondary | ICD-10-CM

## 2012-12-25 LAB — CBC WITH DIFFERENTIAL/PLATELET
Basophils Absolute: 0 10*3/uL (ref 0.0–0.1)
Eosinophils Absolute: 0.1 10*3/uL (ref 0.0–0.7)
HCT: 39.9 % (ref 36.0–46.0)
Lymphs Abs: 1.4 10*3/uL (ref 0.7–4.0)
MCHC: 32.9 g/dL (ref 30.0–36.0)
Monocytes Relative: 5.6 % (ref 3.0–12.0)
Platelets: 161 10*3/uL (ref 150.0–400.0)
RDW: 13.1 % (ref 11.5–14.6)

## 2012-12-25 LAB — TSH: TSH: 1.63 u[IU]/mL (ref 0.35–5.50)

## 2012-12-25 MED ORDER — METOPROLOL SUCCINATE ER 25 MG PO TB24: 25.0000 mg | ORAL_TABLET | Freq: Every day | ORAL | Status: DC

## 2012-12-25 NOTE — Progress Notes (Signed)
Subjective:    Patient ID: Tiffany Moran, female    DOB: 1956/10/30, 57 y.o.   MRN: [id number]  HPI  Mrs Nichter is here for a physical;acute issues include R buttock pain      Review of Systems Pain location:superior R buttock Onset:2 mos Trigger/injury:no Pain quality:sharp Pain severity:up to 8 Duration:until position changed Radiation: no Exacerbating factors:supine or RLDP Treatment/response:pillow under leg & stretching RLE may help  Constitutional: no fever, chills, sweats, change in weight  Musculoskeletal:no  muscle cramps or pain; no  joint stiffness or redness. Some L shin swelling since trauma Skin:no rash, color change Neuro: no weakness; incontinence (stool/urine); numbness and tingling Heme:no lymphadenopathy; abnormal bruising . ? Taste of blood , ?from upper gums. She has no epistaxis, hemoptysis, melena, or rectal bleeding      Objective:   Physical Exam Gen.:Thin but healthy and well-nourished in appearance. Alert, appropriate and cooperative throughout exam. Head: Normocephalic without obvious abnormalities   Eyes: No corneal or conjunctival inflammation noted. Pupils equal round reactive to light and accommodation.  Extraocular motion intact. Vision grossly normal with lenses. Ears: External  ear exam reveals no significant lesions or deformities. Canals clear .TMs normal. Hearing is grossly normal bilaterally. Nose: External nasal exam reveals no deformity or inflammation. Nasal mucosa are pink and moist. No lesions or exudates noted.  Mouth: Oral mucosa and oropharynx reveal no lesions or exudates. Teeth in good repair. Tiny possible posterior body change right buccal mucosa; no active bleeding from mouth or gums Neck: No deformities, masses, or tenderness noted. Range of motion & Thyroid normal. Lungs: Normal respiratory effort; chest expands symmetrically. Lungs are clear to auscultation without rales, wheezes, or increased work of breathing. Heart: Normal  rate and rhythm. Normal S1 and S2. No gallop, or rub. Classic click w/o MR. Abdomen: Bowel sounds normal; abdomen soft and nontender. No masses, organomegaly or hernias noted. Genitalia: Judie Bonus, NP                                                                             Musculoskeletal/extremities: No deformity or scoliosis noted of  the thoracic or lumbar spine. No clubbing, cyanosis, edema, or deformity noted. Range of motion  normal .Tone & strength  normal.Joints normal. Nail health  good. She is able to lie flat and sit up without help. Straight leg raising is negative to 90 bilaterally Vascular: Carotid, radial artery, dorsalis pedis and  posterior tibial pulses are full and equal. No bruits present. Neurologic: Alert and oriented x3. Deep tendon reflexes symmetrical and normal. Gait is normal including toe and heel walking         Skin: Intact without suspicious lesions or rashes. Lymph: No cervical, axillary lymphadenopathy present. Psych: Mood and affect are normal. Normally interactive  Assessment & Plan:  #1 comprehensive physical exam; no acute findings #2 right lateral lumbosacral pain without neurologic deficit  #3 subjective taste of blood in her mouth; no clinical findings Plan: see Orders

## 2012-12-25 NOTE — Patient Instructions (Addendum)
Preventive Health Care: Exercise  30-45  minutes a day, 3-4 days a week. Walking is especially valuable in preventing Osteoporosis. Eat a low-fat diet with lots of fruits and vegetables, up to 7-9 servings per day.  Consume less than 30 grams of sugar per day from foods & drinks with High Fructose Corn Syrup as #1,2,3 or #4 on label. The best exercises for the low back include freestyle swimming, stretch aerobics, and yoga.  Arthritis strength Tylenol is the safest medication to take at bedtime as it has no gastric or cardiovascular risk.  Take the EKG to any emergency room or preop visits. There are nonspecific changes; as long as there is no new change these are not clinically significant  If you activate My Chart; the results can be released to you as soon as they populate from the lab. If you choose not to use this program; the labs have to be reviewed, copied & mailed causing a delay in getting the results to you.

## 2013-03-17 ENCOUNTER — Telehealth: Payer: Self-pay | Admitting: Nurse Practitioner

## 2013-03-17 NOTE — Telephone Encounter (Signed)
PT WOULD LIKE NAMES OF DERMATOLOGIST THAT PATTY RECOMMEND.

## 2013-03-17 NOTE — Telephone Encounter (Signed)
Spoke with pt to give recommendation from [facility] for Dr. Thayer Ohm office for dermatology. Phone # given. Pt appreciative.  aa

## 2013-05-05 ENCOUNTER — Encounter: Payer: Self-pay | Admitting: *Deleted

## 2013-05-06 ENCOUNTER — Ambulatory Visit (INDEPENDENT_AMBULATORY_CARE_PROVIDER_SITE_OTHER): Payer: BC Managed Care – PPO | Admitting: Nurse Practitioner

## 2013-05-06 ENCOUNTER — Encounter: Payer: Self-pay | Admitting: Nurse Practitioner

## 2013-05-06 VITALS — BP 110/56 | HR 66 | Ht 66.0 in | Wt 140.6 lb

## 2013-05-06 DIAGNOSIS — E559 Vitamin D deficiency, unspecified: Secondary | ICD-10-CM

## 2013-05-06 DIAGNOSIS — Z0000 Encounter for general adult medical examination without abnormal findings: Secondary | ICD-10-CM

## 2013-05-06 DIAGNOSIS — Z01419 Encounter for gynecological examination (general) (routine) without abnormal findings: Secondary | ICD-10-CM

## 2013-05-06 LAB — POCT URINALYSIS DIPSTICK: Urobilinogen, UA: NEGATIVE

## 2013-05-06 LAB — HEMOGLOBIN, FINGERSTICK: Hemoglobin, fingerstick: 13.8 g/dL (ref 12.0–16.0)

## 2013-05-06 MED ORDER — ESTRADIOL 0.1 MG/GM VA CREA: 2.0000 g | TOPICAL_CREAM | VAGINAL | Status: DC | PRN

## 2013-05-06 NOTE — Patient Instructions (Signed)

## 2013-05-06 NOTE — Progress Notes (Signed)
57 y.o. G2P2 Married Caucasian Fe here for annual exam.  Still a lot of vaginal dryness., Not using Estrace but maybe 1 X Q week. Recognize the need to increase the use to help with dyspareunia.  No LMP recorded. Patient is postmenopausal.          Sexually active: yes  The current method of family planning is tubal ligation.    Exercising: yes  Home exercise routine includes walking 3 hrs per week. Smoker:  no  Health Maintenance: Pap:  04/11/2012  Normal with negative HR HPV MMG:  04/2012 breast biopsy was negative repeat again  04/2013 Colonoscopy:  2008 recheck in 10 years BMD:   04/2012 T Score: Spine -1.0; Left femur neck -1.9 TDaP:  04/03/2011 Labs: Hgb- 13.8    reports that she has never smoked. She has never used smokeless tobacco. She reports that she does not drink alcohol or use illicit drugs.  Past Medical History  Diagnosis Date  . Hyperlipidemia   . Shingles 2012    leg  . Mitral valve prolapse     Past Surgical History  Procedure Laterality Date  . Tubal ligation    . Exploratory laparotomy      X2 for pain: fibroid, enlarged oviduct  . Dilation and curettage of uterus      Current Outpatient Prescriptions  Medication Sig Dispense Refill  . Calcium Carb-Cholecalciferol (CALCIUM 1000 + D PO) Take by mouth daily.      . Cholecalciferol (VITAMIN D3) 2000 UNITS TABS Take by mouth daily.        Marland Kitchen estradiol (ESTRACE) 0.1 MG/GM vaginal cream Place 2 g vaginally as needed.      . metoprolol succinate (TOPROL-XL) 25 MG 24 hr tablet Take 1 tablet (25 mg total) by mouth daily.  90 tablet  2  . acetaminophen (TYLENOL) 100 MG/ML solution Take 10 mg/kg by mouth every 4 (four) hours as needed for fever.      . clindamycin (CLEOCIN T) 1 % lotion Apply 60 g topically.       No current facility-administered medications for this visit.    Family History  Problem Relation Age of Onset  . Heart attack Father 57  . Myasthenia gravis Father   . Hypertension Father   .  Hyperlipidemia Father   . Osteoporosis Father     due to steroids for Myasthenia Grvis  . Leukemia Mother   . Hyperlipidemia Mother   . Hypertension Mother   . Hypothyroidism Mother   . Prostate cancer Brother   . Stroke Maternal Grandfather     in 58s  . Diabetes Neg Hx     ROS:  Pertinent items are noted in HPI.  Otherwise, a comprehensive ROS was negative.  Exam:   BP 110/56  Pulse 66  Ht 5\' 6"  (1.676 m)  Wt 140 lb 9.6 oz (63.776 kg)  BMI 22.7 kg/m2 Height: 5\' 6"  (167.6 cm)  Ht Readings from Last 3 Encounters:  05/06/13 5\' 6"  (1.676 m)  12/25/12 5\' 6"  (1.676 m)  01/03/12 5\' 6"  (1.676 m)    General appearance: alert, cooperative and appears stated age Head: Normocephalic, without obvious abnormality, atraumatic Neck: no adenopathy, supple, symmetrical, trachea midline and thyroid normal to inspection and palpation Lungs: clear to auscultation bilaterally Breasts: normal appearance, no masses or tenderness Heart: regular rate and rhythm Abdomen: soft, non-tender; no masses,  no organomegaly Extremities: extremities normal, atraumatic, no cyanosis or edema Skin: Skin color, texture, turgor normal. No rashes or  lesions Lymph nodes: Cervical, supraclavicular, and axillary nodes normal. No abnormal inguinal nodes palpated Neurologic: Grossly normal   Pelvic: External genitalia:  no lesions              Urethra:  normal appearing urethra with no masses, tenderness or lesions              Bartholin's and Skene's: normal                 Vagina: atrophic appearing vagina with pale color and discharge, no lesions              Cervix: anteverted              Pap taken: no Bimanual Exam:  Uterus:  normal size, contour, position, consistency, mobility, non-tender              Adnexa: no mass, fullness, tenderness               Rectovaginal: Confirms               Anus:  normal sphincter tone, no lesions  A:  Well Woman with normal exam  Post menopausal  Atrophic  vaginitis  Osteopenia and Vit D def  History of Left needle core biopsy for micro-calcifications - negative 05/23/12    P:   Pap smear as per guidelines   Mammogram due 5/ 2014  Will return for fasting labs  Follow with Vit D results  Refill Estrace vag cream  counseled on breast self exam, osteoporosis, adequate intake of calcium and vitamin D,   diet and exercise, Kegel's exercises return annually or prn  An After Visit Summary was printed and given to the patient.

## 2013-05-07 LAB — VITAMIN D 25 HYDROXY (VIT D DEFICIENCY, FRACTURES): Vit D, 25-Hydroxy: 50 ng/mL (ref 30–89)

## 2013-05-08 ENCOUNTER — Ambulatory Visit (INDEPENDENT_AMBULATORY_CARE_PROVIDER_SITE_OTHER): Payer: BC Managed Care – PPO | Admitting: *Deleted

## 2013-05-08 ENCOUNTER — Telehealth: Payer: Self-pay | Admitting: *Deleted

## 2013-05-08 DIAGNOSIS — Z0000 Encounter for general adult medical examination without abnormal findings: Secondary | ICD-10-CM

## 2013-05-08 DIAGNOSIS — Z1211 Encounter for screening for malignant neoplasm of colon: Secondary | ICD-10-CM

## 2013-05-08 LAB — LIPID PANEL
LDL Cholesterol: 146 mg/dL — ABNORMAL HIGH (ref 0–99)
Total CHOL/HDL Ratio: 3.4 Ratio

## 2013-05-08 LAB — COMPREHENSIVE METABOLIC PANEL
ALT: 17 U/L (ref 0–35)
Albumin: 4.6 g/dL (ref 3.5–5.2)
Alkaline Phosphatase: 96 U/L (ref 39–117)
Glucose, Bld: 82 mg/dL (ref 70–99)
Potassium: 4.5 mEq/L (ref 3.5–5.3)
Sodium: 140 mEq/L (ref 135–145)
Total Bilirubin: 0.8 mg/dL (ref 0.3–1.2)
Total Protein: 7.2 g/dL (ref 6.0–8.3)

## 2013-05-08 NOTE — Telephone Encounter (Signed)
Returning call to Tiffany.

## 2013-05-08 NOTE — Telephone Encounter (Signed)
Message copied by Osie Bond on Thu May 08, 2013  2:54 PM ------      Message from: Ria Comment R      Created: Thu May 08, 2013  9:23 AM       Let patient know result and follow protocol. ------

## 2013-05-08 NOTE — Telephone Encounter (Signed)
LVM for pt to return my call in regards to lab results.  

## 2013-05-08 NOTE — Progress Notes (Signed)
Encounter reviewed by Dr. Brook Silva.  

## 2013-05-09 ENCOUNTER — Telehealth: Payer: Self-pay | Admitting: *Deleted

## 2013-05-09 NOTE — Telephone Encounter (Signed)
Pt is aware of vitamin d lab results and will continue to take vitamin d (otc) 1000 IU po qd.

## 2013-05-09 NOTE — Telephone Encounter (Signed)
Message copied by Osie Bond on Fri May 09, 2013  8:53 AM ------      Message from: Ria Comment R      Created: Thu May 08, 2013  9:23 AM       Let patient know result and follow protocol. ------

## 2013-05-13 ENCOUNTER — Telehealth: Payer: Self-pay | Admitting: *Deleted

## 2013-05-13 NOTE — Telephone Encounter (Signed)
LVM for pt to return my call in regards to lab results.  

## 2013-05-13 NOTE — Telephone Encounter (Signed)
Message copied by Osie Bond on Tue May 13, 2013 10:41 AM ------      Message from: Ria Comment R      Created: Tue May 13, 2013  8:24 AM       Last year total cholesterol was 222 this time it is up at 241, LDL was 139 and now 146.  Have pt to follow with Dr. Alwyn Ren her PCP about results and he will advise treatment if indicated.  Either way needs to go back on low cholesterol diet. ------

## 2013-05-15 ENCOUNTER — Telehealth: Payer: Self-pay | Admitting: *Deleted

## 2013-05-15 NOTE — Telephone Encounter (Signed)
Message copied by Osie Bond on Thu May 15, 2013 12:53 PM ------      Message from: Ria Comment R      Created: Tue May 13, 2013  8:24 AM       Last year total cholesterol was 222 this time it is up at 241, LDL was 139 and now 146.  Have pt to follow with Dr. Alwyn Ren her PCP about results and he will advise treatment if indicated.  Either way needs to go back on low cholesterol diet. ------

## 2013-05-15 NOTE — Telephone Encounter (Signed)
Pt is aware of all results

## 2013-06-19 ENCOUNTER — Telehealth: Payer: Self-pay | Admitting: Internal Medicine

## 2013-06-19 NOTE — Telephone Encounter (Signed)
Please advise. Thanks.  

## 2013-06-19 NOTE — Telephone Encounter (Signed)
Mrs Wadsworth wanted to let dr Alwyn Ren know her labs from her other doctor Malachi Pro) is in her chart to review. Please call patient after dr Alwyn Ren reviews her labs. thanks

## 2013-06-21 NOTE — Telephone Encounter (Signed)
Risk of premature heart attack or stroke increases as LDL or BAD cholesterol rises, especially if there is family history of heart attack in males before 86 or women before 55. Based on your prior advanced testing, your LDL goal is < 140 , ideally < 100. Your present LDL increases long term heart attack or stroke risk <10 %.The best dietary  information on cholesterol is Dr Gildardo Griffes book Eat, Drink & Be Healthy. Cardiovascular exercise, this can be as simple a program as walking, is recommended 30-45 minutes 3-4 times per week. If you're not exercising you should take 6-8 weeks to build up to this level.

## 2013-06-23 NOTE — Telephone Encounter (Signed)
Mychart message sent to patient with Dr.Hopper's response to labs

## 2013-08-06 ENCOUNTER — Telehealth: Payer: Self-pay | Admitting: Internal Medicine

## 2013-08-06 NOTE — Telephone Encounter (Signed)
Patient Information:  Caller Name: Tiffany Moran  Phone: [phone]  Patient: Tiffany, Moran  Gender: Female  DOB: [birth year]  Age: 57 Years  PCP: Marga Melnick  Office Follow Up:  Does the office need to follow up with this patient?: No  Instructions For The Office: N/A  RN Note:  Pain is 4 inches superior to left popliteal space with bruise behind left knee.  Has "twinges" of pain even when sitting still.   Symptoms  Reason For Call & Symptoms: Intermittent pain behind and superior to left popliteal space.  Pain returned last night. Noted another episode of sharp, shooting pain at 1000 08/06/13, rated 10/10, after turned while standing that resolved. Has a bruise behind knee. Walks for an hour daily.  Reviewed Health History In EMR: Yes  Reviewed Medications In EMR: Yes  Reviewed Allergies In EMR: Yes  Reviewed Surgeries / Procedures: Yes  Date of Onset of Symptoms: 07/27/2013  Treatments Tried: Ice pack treatment, Epson salt, Advil. Calcium, water.  Treatments Tried Worked: Yes  Guideline(s) Used:  Knee Pain  Leg Pain  Disposition Per Guideline:   See Within 3 Days in Office  Reason For Disposition Reached:   Moderate pain (e.g., interferes with normal activities, limping) and present > 3 days  Advice Given:  Reassurance - Leg Pain  Usually leg pain is not serious. You have told me that there is no redness, numbness, or swelling.  Causes of leg pain can include a strained muscle, a forgotten minor injury, and tendinitis.  Pain Medicines:  For pain relief, you can take either acetaminophen, ibuprofen, or naproxen.  They are over-the-counter (OTC) pain drugs. You can buy them at the drugstore.  Call Back If:  Moderate pain (e.g., limping) lasts more than 3 days  Signs of infection occur (e.g., spreading redness, warmth, fever)  You become worse.  Patient Will Follow Care Advice:  YES  Appointment Scheduled:  08/07/2013 16:15:00 Appointment Scheduled Provider:  Lelon Perla.

## 2013-08-06 NOTE — Telephone Encounter (Signed)
Appointment Scheduled:  08/07/2013 16:15:00  Appointment Scheduled Provider:  Lelon Perla

## 2013-08-07 ENCOUNTER — Ambulatory Visit: Payer: Self-pay | Admitting: Family Medicine

## 2013-08-21 LAB — FECAL OCCULT BLOOD, IMMUNOCHEMICAL: Fecal Occult Blood: NEGATIVE

## 2013-09-02 ENCOUNTER — Telehealth: Payer: Self-pay | Admitting: *Deleted

## 2013-09-02 MED ORDER — ALPRAZOLAM 0.5 MG PO TBDP: ORAL_TABLET | ORAL | Status: DC

## 2013-09-02 NOTE — Telephone Encounter (Signed)
Ok  #30 

## 2013-09-02 NOTE — Telephone Encounter (Signed)
Rx printed for alprazolam .  Ag cma

## 2013-09-02 NOTE — Telephone Encounter (Signed)
Rx refill request for Alprazolam 0.5 mg  Last ov - 12/25/12  Last date filled-12/08/11 #21 0-R No UDS or contract on file.     Please Advise   Ag cma

## 2013-09-03 ENCOUNTER — Other Ambulatory Visit: Payer: Self-pay

## 2013-09-03 MED ORDER — ALPRAZOLAM 0.5 MG PO TABS: 0.5000 mg | ORAL_TABLET | Freq: Every evening | ORAL | Status: DC | PRN

## 2013-09-03 NOTE — Telephone Encounter (Signed)
Rx sent for ODT tablet. Correct Rx faxed same directions and quantity.     KP

## 2013-09-15 ENCOUNTER — Telehealth: Payer: Self-pay | Admitting: Nurse Practitioner

## 2013-09-15 NOTE — Telephone Encounter (Signed)
Fecal occult blood:    3wk ago   Fecal Occult Blood negative    Resulting Agency [facility]   Specimen Collected: 08/20/13    Advised patient. Will follow up prn.

## 2013-09-15 NOTE — Telephone Encounter (Signed)
Patient is calling regarding a stool sample she brought in many months ago and never heard anything about it.

## 2013-10-23 ENCOUNTER — Other Ambulatory Visit: Payer: Self-pay

## 2013-12-17 ENCOUNTER — Other Ambulatory Visit: Payer: Self-pay | Admitting: *Deleted

## 2013-12-17 ENCOUNTER — Telehealth: Payer: Self-pay | Admitting: Internal Medicine

## 2013-12-17 MED ORDER — METOPROLOL SUCCINATE ER 25 MG PO TB24: 25.0000 mg | ORAL_TABLET | Freq: Every day | ORAL | Status: DC

## 2013-12-17 NOTE — Telephone Encounter (Signed)
Metoprolol e-scribed to pharmacy. PT needs OV. JG//CMA

## 2013-12-17 NOTE — Telephone Encounter (Signed)
Patient is calling to request a refill on her metoprolol succinate (TOPROL-XL) 25 MG. States that her pharmacy has sent 3 requests with no response.

## 2014-01-30 ENCOUNTER — Encounter: Payer: Self-pay | Admitting: Internal Medicine

## 2014-01-30 ENCOUNTER — Ambulatory Visit (INDEPENDENT_AMBULATORY_CARE_PROVIDER_SITE_OTHER): Payer: BC Managed Care – PPO | Admitting: Internal Medicine

## 2014-01-30 ENCOUNTER — Other Ambulatory Visit (INDEPENDENT_AMBULATORY_CARE_PROVIDER_SITE_OTHER): Payer: BC Managed Care – PPO

## 2014-01-30 VITALS — BP 90/50 | HR 68 | Temp 97.5°F | Ht 65.5 in | Wt 143.0 lb

## 2014-01-30 DIAGNOSIS — E785 Hyperlipidemia, unspecified: Secondary | ICD-10-CM

## 2014-01-30 DIAGNOSIS — Z0000 Encounter for general adult medical examination without abnormal findings: Secondary | ICD-10-CM

## 2014-01-30 DIAGNOSIS — I471 Supraventricular tachycardia, unspecified: Secondary | ICD-10-CM

## 2014-01-30 LAB — TSH: TSH: 1.79 u[IU]/mL (ref 0.35–5.50)

## 2014-01-30 MED ORDER — METOPROLOL SUCCINATE ER 25 MG PO TB24: 25.0000 mg | ORAL_TABLET | Freq: Every day | ORAL | Status: DC

## 2014-01-30 NOTE — Progress Notes (Signed)
Pre visit review using our clinic review tool, if applicable. No additional management support is needed unless otherwise documented below in the visit note. 

## 2014-01-30 NOTE — Patient Instructions (Addendum)
I recommend a Sports Medicine consultation to determine optimal therapy for knee & hip symptoms. To prevent palpitations or premature beats, avoid stimulants such as decongestants, diet pills, nicotine, or caffeine (coffee, tea, cola, or chocolate) to excess. Protect  hands, feet, and ears from cold exposure including ice chests. Use silk sock & mitten liners; this can be purchased at outdoor supply stores.

## 2014-01-30 NOTE — Progress Notes (Signed)
   Subjective:    Patient ID: Tiffany Moran, female    DOB: 1956-08-09, 58 y.o.   MRN: [id number]  HPI She is here for a physical;acute issues include pain R hip & L medial knee. [facility] [facility] orth[facility]ics performed MRI which revealed subcortical degenerative change lungs are clear medial patellar facet suggestive of underlying fissures with small joint effusion.     Review of Systems  A heart healthy diet is followed; exercise encompasses 60 minutes 3-5  times per week as walking without symptoms.  Family history is + for premature coronary disease in her father. Advanced cholesterol testing reveals  LDL goal is less than 140 ; ideally < 110 . To date no statin.  Low dose ASA not taken Specifically denied are  chest pain, dyspnea, or claudication. Recent increase in palpitations. Pain L knee with walking & pain R hip in RLD position.        Objective:   Physical Exam Gen.: Thin but healthy and well-nourished in appearance. Alert, appropriate and cooperative throughout exam. Appears younger than stated age  Head: Normocephalic without obvious abnormalities  Eyes: No corneal or conjunctival inflammation noted. Pupils equal round reactive to light and accommodation. Extraocular motion intact.  Ears: External  ear exam reveals no significant lesions or deformities. Canals clear .TMs normal. Hearing is grossly normal bilaterally. Nose: External nasal exam reveals no deformity or inflammation. Nasal mucosa are pink and moist. No lesions or exudates noted.   Mouth: Oral mucosa and oropharynx reveal no lesions or exudates. Teeth in good repair. Neck: No deformities, masses, or tenderness noted. Range of motion & Thyroid normal. Lungs: Normal respiratory effort; chest expands symmetrically. Lungs are clear to auscultation without rales, wheezes, or increased work of breathing. Heart: Normal rate and rhythm. Normal S1 and S2. No gallop,  or rub. Intermittent click. No MR murmur. Abdomen: Bowel sounds  normal; abdomen soft and nontender. No masses, organomegaly or hernias noted. Genitalia:  as per Gyn                                  Musculoskeletal/extremities: No deformity or scoliosis noted of  the thoracic or lumbar spine.   No clubbing, cyanosis, edema, or significant extremity  deformity noted. Range of motion normal .Tone & strength normal. Hand joints normal . Fingernail  health good. Able to lie down & sit up w/o help. Negative SLR bilaterally Vascular: Carotid, radial artery, dorsalis pedis and  posterior tibial pulses are full and equal. No bruits present. Neurologic: Alert and oriented x3. Deep tendon reflexes symmetrical and normal.  Skin: Intact without suspicious lesions or rashes.Hands cool. Small papulr, non vesicular lesions of fingers Lymph: No cervical, axillary lymphadenopathy present. Psych: Mood and affect are normal. Normally interactive                                                                                        Assessment & Plan:  #1 comprehensive physical exam; no acute findings #2 knee pain  # ? Pyriformis syndrome  Plan: see Orders  & Recommendations

## 2014-02-03 LAB — VITAMIN D 1,25 DIHYDROXY
VITAMIN D3 1, 25 (OH): 63 pg/mL
Vitamin D 1, 25 (OH)2 Total: 63 pg/mL (ref 18–72)

## 2014-02-06 ENCOUNTER — Encounter: Payer: Self-pay | Admitting: Internal Medicine

## 2014-04-21 ENCOUNTER — Encounter: Payer: Self-pay | Admitting: Family Medicine

## 2014-04-21 ENCOUNTER — Telehealth: Payer: Self-pay | Admitting: *Deleted

## 2014-04-21 ENCOUNTER — Ambulatory Visit (INDEPENDENT_AMBULATORY_CARE_PROVIDER_SITE_OTHER): Payer: BC Managed Care – PPO | Admitting: Family Medicine

## 2014-04-21 ENCOUNTER — Other Ambulatory Visit (INDEPENDENT_AMBULATORY_CARE_PROVIDER_SITE_OTHER): Payer: BC Managed Care – PPO

## 2014-04-21 VITALS — BP 132/90 | HR 76 | Wt 141.0 lb

## 2014-04-21 DIAGNOSIS — M25569 Pain in unspecified knee: Secondary | ICD-10-CM

## 2014-04-21 DIAGNOSIS — IMO0002 Reserved for concepts with insufficient information to code with codable children: Secondary | ICD-10-CM

## 2014-04-21 DIAGNOSIS — M25562 Pain in left knee: Secondary | ICD-10-CM

## 2014-04-21 DIAGNOSIS — M7050 Other bursitis of knee, unspecified knee: Secondary | ICD-10-CM | POA: Insufficient documentation

## 2014-04-21 DIAGNOSIS — M533 Sacrococcygeal disorders, not elsewhere classified: Secondary | ICD-10-CM

## 2014-04-21 MED ORDER — MECLIZINE HCL 12.5 MG PO TABS: 12.5000 mg | ORAL_TABLET | Freq: Three times a day (TID) | ORAL | Status: DC | PRN

## 2014-04-21 MED ORDER — DICLOFENAC SODIUM 2 % TD SOLN: 2.0000 "application " | Freq: Two times a day (BID) | TRANSDERMAL | Status: DC

## 2014-04-21 NOTE — Patient Instructions (Addendum)
Very nice to meet you Compression sleeve to thigh to help with hamstring Try exercises I am giving you 3 times a week.  Ice 20 minutes 2 times a day especially after exercise.  pennsaid twice daily  Biking and walking would be good.   Vitamin D 2000 IU daily Turmeric 500mg  twice daily  Sacroiliac Joint Mobilization and Rehab 1. Work on pretzel stretching, shoulder back and leg draped in front. 3-5 sets, 30 sec.. 2. hip abductor rotations. standing, hip flexion and rotation outward then inward. 3 sets, 15 reps. when can do comfortably, add ankle weights starting at 2 pounds.  3. cross over stretching - shoulder back to ground, same side leg crossover. 3-5 sets for 30 min..  4. rolling up and back knees to chest and rocking. 5. sacral tilt - 5 sets, hold for 5-10 seconds    See me again in 4 weeks.

## 2014-04-21 NOTE — Assessment & Plan Note (Signed)
Patient at the end of the appointment did bring up another problem of having back pain and localized over the right SI joint. Discussed with patient I would rather followup we can discuss it in further detail. Patient was given home exercise program to try first and we'll see if patient responds to that otherwise we'll address and more completeness at followup in 3 weeks.

## 2014-04-21 NOTE — Progress Notes (Signed)
[facility] [address] [place], [place] [address] Phone: [phone] Subjective:    I'm seeing this patient by the request  of:  Unice Cobble, MD   CC: Knee pain, left  [id number] Tiffany Moran is a 58 y.o. female coming in with complaint of left knee pain. Patient states that she's had this pain for quite some time. Patient was seen by [facility] orthopedics back in October of 2014 and even had an MRI which she brought with her today. MRI was reviewed today that show that patient does have mild to moderate osteophytic changes mostly of the patellofemoral joint. Otherwise fairly unremarkable. Patient states that most of the pain she has seems to be on the medial aspect of the left knee and she points to just inferior to the medial joint line. Patient has had a history of hamstring discomfort previously. Patient was wearing an Ace wrap previously and it did seem to help out somewhat. Patient is also try to need brace that helps that sometimes. Patient is not taking any medications he would like to avoid it at all costs. Patient is wondering if there is anything she can do or if she needs surgery. Patient rates the severity a 7/10. Denies any swelling, radiation or any numbness. No true history of injury.     Past medical history, social, surgical and family history all reviewed in electronic medical record.   Review of Systems: No headache, visual changes, nausea, vomiting, diarrhea, constipation, dizziness, abdominal pain, skin rash, fevers, chills, night sweats, weight loss, swollen lymph nodes, body aches, joint swelling, muscle aches, chest pain, shortness of breath, mood changes.   Objective Blood pressure 132/90, pulse 76, weight 141 lb (63.957 kg), SpO2 99.00%.  General: No apparent distress alert and oriented x3 mood and affect normal, dressed appropriately.  HEENT: Pupils equal, extraocular movements intact  Respiratory: Patient's speak in full  sentences and does not appear short of breath  Cardiovascular: No lower extremity edema, non tender, no erythema  Skin: Warm dry intact with no signs of infection or rash on extremities or on axial skeleton.  Abdomen: Soft nontender  Neuro: Cranial nerves II through XII are intact, neurovascularly intact in all extremities with 2+ DTRs and 2+ pulses.  Lymph: No lymphadenopathy of posterior or anterior cervical chain or axillae bilaterally.  Gait normal with good balance and coordination.  MSK:  Non tender with full range of motion and good stability and symmetric strength and tone of shoulders, elbows, wrist, hip, and ankles bilaterally.  Knee: Left Normal to inspection with no erythema or effusion or obvious bony abnormalities. Palpation normal with no warmth, joint line tenderness, patellar tenderness, or condyle tenderness. Patient is minimally tender to palpation over the pes anserine bursa and distal hamstring. ROM full in flexion and extension and lower leg rotation. Ligaments with solid consistent endpoints including ACL, PCL, LCL, MCL. Negative Mcmurray's, Apley's, and Thessalonian tests.  painful patellar compression. Patellar glide with mild crepitus. Patellar and quadriceps tendons unremarkable. Hamstring and quadriceps strength is normal.   MSK US performed of: Left knee This study was ordered, performed, and interpreted by Charlann Boxer D.O.  Knee: All structures visualized. Anteromedial, anterolateral, posteromedial, and posterolateral menisci unremarkable without tearing, fraying, effusion, or displacement. Patellar Tendon unremarkable on long and transverse views without effusion. Patient does have a moderate arthritis of the patellofemoral joint. No abnormality of prepatellar bursa. LCL and MCL unremarkable on long and transverse views. No abnormality of origin of medial  or lateral head of the gastrocnemius. Patient though does have some bursitis over the pes anserine area  as well as some mild increased Doppler flow over the distal tendon insertion.  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    Impression and Recommendations:     This case required medical decision making of moderate complexity.

## 2014-04-21 NOTE — Telephone Encounter (Signed)
Refill done per dr. Tamala Julian.

## 2014-04-21 NOTE — Assessment & Plan Note (Signed)
Patient wanted to continue conservative therapy. Patient told to get a compression sleeve to change the fulcrum of the hamstring tendon. We discussed topical anti-inflammatories and patient was given medication per orders. We discussed icing protocol and was given home exercises program. Patient will followup again in 3 weeks. At that time the continuing to have trouble we may want to consider an injection. Overall patient's arthritis is seen on the MRI is mild to moderate to did not think that this is contributing to her pain at this time.

## 2014-05-19 ENCOUNTER — Ambulatory Visit (INDEPENDENT_AMBULATORY_CARE_PROVIDER_SITE_OTHER): Payer: BC Managed Care – PPO | Admitting: Nurse Practitioner

## 2014-05-19 ENCOUNTER — Encounter: Payer: Self-pay | Admitting: Nurse Practitioner

## 2014-05-19 VITALS — BP 104/62 | HR 64 | Ht 66.0 in | Wt 139.0 lb

## 2014-05-19 DIAGNOSIS — Z0000 Encounter for general adult medical examination without abnormal findings: Secondary | ICD-10-CM

## 2014-05-19 DIAGNOSIS — Z01419 Encounter for gynecological examination (general) (routine) without abnormal findings: Secondary | ICD-10-CM

## 2014-05-19 LAB — LIPID PANEL
CHOL/HDL RATIO: 3.4 ratio
Cholesterol: 239 mg/dL — ABNORMAL HIGH (ref 0–200)
HDL: 71 mg/dL (ref >39)
LDL CALC: 148 mg/dL — AB (ref 0–99)
TRIGLYCERIDES: 101 mg/dL (ref <150)
VLDL: 20 mg/dL (ref 0–40)

## 2014-05-19 LAB — POCT URINALYSIS DIPSTICK
BILIRUBIN UA: NEGATIVE
Blood, UA: NEGATIVE
Glucose, UA: NEGATIVE
Ketones, UA: NEGATIVE
LEUKOCYTES UA: NEGATIVE
Nitrite, UA: NEGATIVE
PH UA: 8
PROTEIN UA: NEGATIVE
Urobilinogen, UA: NEGATIVE

## 2014-05-19 LAB — CBC WITH DIFFERENTIAL/PLATELET
Basophils Absolute: 0 10*3/uL (ref 0.0–0.1)
Basophils Relative: 0 % (ref 0–1)
Eosinophils Absolute: 0.1 10*3/uL (ref 0.0–0.7)
Eosinophils Relative: 2 % (ref 0–5)
HCT: 40.8 % (ref 36.0–46.0)
Hemoglobin: 13.8 g/dL (ref 12.0–15.0)
LYMPHS PCT: 41 % (ref 12–46)
Lymphs Abs: 1.4 10*3/uL (ref 0.7–4.0)
MCH: 30.1 pg (ref 26.0–34.0)
MCHC: 33.8 g/dL (ref 30.0–36.0)
MCV: 89.1 fL (ref 78.0–100.0)
Monocytes Absolute: 0.2 10*3/uL (ref 0.1–1.0)
Monocytes Relative: 6 % (ref 3–12)
Neutro Abs: 1.8 10*3/uL (ref 1.7–7.7)
Neutrophils Relative %: 51 % (ref 43–77)
PLATELETS: 154 10*3/uL (ref 150–400)
RBC: 4.58 MIL/uL (ref 3.87–5.11)
RDW: 14 % (ref 11.5–15.5)
WBC: 3.5 10*3/uL — AB (ref 4.0–10.5)

## 2014-05-19 LAB — COMPREHENSIVE METABOLIC PANEL
ALBUMIN: 4.5 g/dL (ref 3.5–5.2)
ALK PHOS: 74 U/L (ref 39–117)
ALT: 16 U/L (ref 0–35)
AST: 17 U/L (ref 0–37)
BUN: 14 mg/dL (ref 6–23)
CALCIUM: 9.8 mg/dL (ref 8.4–10.5)
CHLORIDE: 102 meq/L (ref 96–112)
CO2: 30 mEq/L (ref 19–32)
Creat: 0.8 mg/dL (ref 0.50–1.10)
GLUCOSE: 87 mg/dL (ref 70–99)
POTASSIUM: 4.4 meq/L (ref 3.5–5.3)
Sodium: 141 mEq/L (ref 135–145)
Total Bilirubin: 0.9 mg/dL (ref 0.2–1.2)
Total Protein: 7 g/dL (ref 6.0–8.3)

## 2014-05-19 LAB — HEMOGLOBIN, FINGERSTICK: Hemoglobin, fingerstick: 13.8 g/dL (ref 12.0–16.0)

## 2014-05-19 MED ORDER — ESTRADIOL 0.1 MG/GM VA CREA: TOPICAL_CREAM | VAGINAL | Status: DC

## 2014-05-19 NOTE — Patient Instructions (Signed)

## 2014-05-19 NOTE — Progress Notes (Signed)
Patient ID: Tiffany Moran, female   DOB: 10/23/1956, 58 y.o.   MRN: [id number] 58 y.o. G2P2 Married Caucasian Fe here for annual exam.  Some vaso symptoms.  Tolerable.    Patient's last menstrual period was 06/17/2009.          Sexually active: no  The current method of family planning is tubal ligation and post menopausal status.    Exercising: yes  walking Smoker:  no  Health Maintenance: Pap:  04/11/2012  Normal with negative HR HPV MMG:  05/27/13, diagnostic, Bi-Rads 2: benign findings, repeat in 1 year Colonoscopy:  2008 recheck in 10 years BMD:   04/2012 T Score: Spine -1.0; Left femur neck -1.9 TDaP:  04/03/2011 Labs:  HB:  13.8  Urine:  Negative    reports that she has never smoked. She has never used smokeless tobacco. She reports that she does not drink alcohol or use illicit drugs.  Past Medical History  Diagnosis Date  . Hyperlipidemia   . Shingles 2012    leg  . Mitral valve prolapse   . Cardiospasm 1983    felt may be related to MVP    Past Surgical History  Procedure Laterality Date  . Tubal ligation    . Exploratory laparotomy      X2 for pain: fibroid, enlarged oviduct  . Dilation and curettage of uterus    . Salpingectomy  2007 or 2008    and fibroid removed  . Colonoscopy  2007    negatve, [place] ,[name]    Current Outpatient Prescriptions  Medication Sig Dispense Refill  . clindamycin (CLEOCIN T) 1 % lotion Apply 60 g topically.      . Diclofenac Sodium (PENNSAID) 2 % SOLN Place 2 application onto the skin 2 (two) times daily.  112 g  3  . estradiol (ESTRACE) 0.1 MG/GM vaginal cream Place 7.03 Applicatorfuls vaginally as needed.  42.5 g  3  . meclizine (ANTIVERT) 12.5 MG tablet Take 1 tablet (12.5 mg total) by mouth 3 (three) times daily as needed for dizziness.  30 tablet  0  . metoprolol succinate (TOPROL-XL) 25 MG 24 hr tablet Take 1 tablet (25 mg total) by mouth daily.  90 tablet  3  . ALPRAZolam (XANAX) 0.5 MG tablet Take 1 tablet (0.5 mg total) by  mouth at bedtime as needed for sleep.  30 tablet  0   No current facility-administered medications for this visit.    Family History  Problem Relation Age of Onset  . Heart attack Father 58  . Myasthenia gravis Father   . Hypertension Father   . Hyperlipidemia Father   . Osteoporosis Father     due to steroids for Myasthenia Grvis  . Leukemia Mother 45  . Hyperlipidemia Mother   . Hypertension Mother   . Hypothyroidism Mother   . Prostate cancer Brother     in 53s  . Stroke Maternal Grandfather     in 76s  . Diabetes Neg Hx     ROS:  Pertinent items are noted in HPI.  Otherwise, a comprehensive ROS was negative.  Exam:   BP 104/62  Pulse 64  Ht 5\' 6"  (1.676 m)  Wt 139 lb (63.05 kg)  BMI 22.45 kg/m2  LMP 06/17/2009 Height: 5\' 6"  (167.6 cm)  Ht Readings from Last 3 Encounters:  05/19/14 5\' 6"  (1.676 m)  01/30/14 5' 5.5" (1.664 m)  05/06/13 5\' 6"  (1.676 m)    General appearance: alert, cooperative and appears stated age Head:  Normocephalic, without obvious abnormality, atraumatic Neck: no adenopathy, supple, symmetrical, trachea midline and thyroid normal to inspection and palpation Lungs: clear to auscultation bilaterally Breasts: normal appearance, no masses or tenderness Heart: regular rate and rhythm Abdomen: soft, non-tender; no masses,  no organomegaly Extremities: extremities normal, atraumatic, no cyanosis or edema Skin: Skin color, texture, turgor normal. No rashes or lesions Lymph nodes: Cervical, supraclavicular, and axillary nodes normal. No abnormal inguinal nodes palpated Neurologic: Grossly normal   Pelvic: External genitalia:  no lesions              Urethra:  normal appearing urethra with no masses, tenderness or lesions              Bartholin's and Skene's: normal                 Vagina: atrophic appearing vagina with pale color and no discharge, no lesions              Cervix: anteverted              Pap taken: yes Bimanual Exam:  Uterus:   normal size, contour, position, consistency, mobility, non-tender              Adnexa: no mass, fullness, tenderness               Rectovaginal: Confirms               Anus:  normal sphincter tone, no lesions  A:  Well Woman with normal exam  Postmenopausal  Atrophic vaginitis  P:   Reviewed health and wellness pertinent to exam  Pap smear taken today  Mammogram is due 05/2014 and may need diagnostic as she has in the past.  Refilled Estrace vaginal cream for a year  Counseled on breast self exam, mammography screening, use and side effects of HRT, adequate intake of calcium and vitamin D, diet and exercise return annually or prn  An After Visit Summary was printed and given to the patient.

## 2014-05-20 LAB — VITAMIN D 25 HYDROXY (VIT D DEFICIENCY, FRACTURES): Vit D, 25-Hydroxy: 42 ng/mL (ref 30–89)

## 2014-05-20 LAB — TSH: TSH: 2.309 u[IU]/mL (ref 0.350–4.500)

## 2014-05-21 ENCOUNTER — Ambulatory Visit: Payer: BC Managed Care – PPO | Admitting: Family Medicine

## 2014-05-21 LAB — IPS PAP TEST WITH HPV

## 2014-05-22 NOTE — Progress Notes (Signed)
Encounter reviewed by Dr. Brook Silva.  

## 2014-06-08 ENCOUNTER — Telehealth: Payer: Self-pay | Admitting: Family Medicine

## 2014-06-08 NOTE — Telephone Encounter (Signed)
Patrici Ranks called from patient experience request for [facility] Cerise to call pt back. Pt would not tell the reason. Please call pt

## 2014-06-10 ENCOUNTER — Telehealth: Payer: Self-pay | Admitting: Nurse Practitioner

## 2014-06-10 NOTE — Telephone Encounter (Signed)
Routing to [employer], FNP and Abelino Derrick, CMA for review and advise if BMD has arrived to office.

## 2014-06-10 NOTE — Telephone Encounter (Signed)
This BMD has been looked and sent to [facility] to call the patient.

## 2014-06-10 NOTE — Telephone Encounter (Signed)
Patient came in wanting to know if the results for dexa is in said she had it last week and they told her it would only take 24 hrs

## 2014-06-10 NOTE — Telephone Encounter (Signed)
Pt notified of BMD results per written note on hardcopy.  She voices understanding and is agreeable with plan.   Hard copy sent to scan.

## 2014-07-07 ENCOUNTER — Telehealth: Payer: Self-pay | Admitting: Nurse Practitioner

## 2014-07-07 NOTE — Telephone Encounter (Signed)
Patient is checking about what insurance called and spoke with [place] about

## 2014-10-19 ENCOUNTER — Encounter: Payer: Self-pay | Admitting: Nurse Practitioner

## 2015-02-02 ENCOUNTER — Ambulatory Visit (INDEPENDENT_AMBULATORY_CARE_PROVIDER_SITE_OTHER): Payer: BLUE CROSS/BLUE SHIELD | Admitting: Internal Medicine

## 2015-02-02 ENCOUNTER — Encounter: Payer: Self-pay | Admitting: Internal Medicine

## 2015-02-02 VITALS — BP 136/82 | HR 74 | Temp 97.5°F | Resp 14 | Ht 66.0 in | Wt 145.0 lb

## 2015-02-02 DIAGNOSIS — R002 Palpitations: Secondary | ICD-10-CM

## 2015-02-02 DIAGNOSIS — Z8679 Personal history of other diseases of the circulatory system: Secondary | ICD-10-CM

## 2015-02-02 DIAGNOSIS — M8580 Other specified disorders of bone density and structure, unspecified site: Secondary | ICD-10-CM

## 2015-02-02 DIAGNOSIS — Z0189 Encounter for other specified special examinations: Secondary | ICD-10-CM

## 2015-02-02 DIAGNOSIS — Z0000 Encounter for general adult medical examination without abnormal findings: Secondary | ICD-10-CM

## 2015-02-02 NOTE — Progress Notes (Signed)
Subjective:    Patient ID: Tiffany Moran, female    DOB: 1956/05/22, 59 y.o.   MRN: [id number]  HPI  She is here for a physical;acute issues described below.  She is on a heart healthy diet. Lipids were checked by her gynecologist in June 2015. LDL was minimally above the goal of 140 with a value of 148. HDL is protective. Her father did have premature heart attack. She walks daily weather permitting. She averages at least 3-5 days per week for 60-75 minutes. She will occasionally have skipping with the exercise;a cough will terminate this. She has history of bowing of the mitral valve. She has PMH of  paroxysmal atrial tachycardia. She has occasional palpitations @ rest also. SAhe is compliant with her beta blocker.  Her colonoscopy was done at age 50; she will be due for follow-up this year. She has no active GI symptoms.  Bone density was done in June; she has stable osteopenia of left femoral neck. Her fracture risk is not increased .She is not on regular vitamin D or calcium supplementation. She's never taken bone building therapy. Her father did have osteoporosis but this was in the context of steroids chronically for Myasthenia Gravis.  Review of Systems   Chest pain, tachycardia, exertional dyspnea, paroxysmal nocturnal dyspnea, claudication or edema are absent.  Unexplained weight loss, abdominal pain, significant dyspepsia, dysphagia, melena, rectal bleeding, or persistently small caliber stools are denied.      Objective:   Physical Exam  Pertinent or positive findings include: She has unsustained nystagmus with lateral and superior gaze with extraocular motion. She has a classic mitral click at the apex. She has isolated DIP changes of the index fingers. There is slight instability the knees without crepitus or effusion. The dorsalis pedis pulses are slightly decreased but equal.  Gen.: Adequately nourished in appearance. Alert, appropriate and cooperative throughout  exam.  Appears younger than stated age  Head: Normocephalic without obvious abnormalities Eyes: No corneal or conjunctival inflammation noted. Pupils equal round reactive to light and accommodation. Extraocular motion intact.  Ears: External  ear exam reveals no significant lesions or deformities. Canals clear .TMs normal. Hearing is grossly normal bilaterally. Nose: External nasal exam reveals no deformity or inflammation. Nasal mucosa are pink and moist. No lesions or exudates noted.   Mouth: Oral mucosa and oropharynx reveal no lesions or exudates. Teeth in good repair. Neck: No deformities, masses, or tenderness noted. Range of motion & Thyroid normal Lungs: Normal respiratory effort; chest expands symmetrically. Lungs are clear to auscultation without rales, wheezes, or increased work of breathing. Heart: Normal rate and rhythm. Normal S1 and S2. No gallop,  or rub. No murmur. Abdomen: Bowel sounds normal; abdomen soft and nontender. No masses, organomegaly or hernias noted. Genitalia: as per Gyn                                  Musculoskeletal/extremities: No deformity or scoliosis noted of  the thoracic or lumbar spine. No clubbing, cyanosis, edema, or significant extremity  deformity noted.  Range of motion normal . Tone & strength normal. Able to lie down & sit up w/o help.  Negative SLR bilaterally Vascular: Carotid, radial artery, dorsalis pedis and  posterior tibial pulses are equal. No bruits present. Neurologic: Alert and oriented x3. Deep tendon reflexes symmetrical and normal.  Gait normal      Skin: Intact without suspicious lesions or rashes. Lymph:  No cervical, axillary lymphadenopathy present. Psych: Mood and affect are normal. Normally interactive                                                                                     Assessment & Plan:  #1 comprehensive physical exam; no acute findings #2 palpitations w & w/o exercise  Plan: see Orders  &  Recommendations

## 2015-02-02 NOTE — Progress Notes (Signed)
Pre visit review using our clinic review tool, if applicable. No additional management support is needed unless otherwise documented below in the visit note. 

## 2015-02-02 NOTE — Patient Instructions (Addendum)
To prevent palpitations or premature beats, avoid stimulants such as decongestants, diet pills, nicotine, or caffeine (coffee, tea, cola, or chocolate) to excess.  Please follow a Mediaterranean type diet  (many good cook books readily available) or review Dr Nunzio Cory book Eat, [place] for best  dietary cholesterol information & options.  Continue excellent cardiovascular exercise program  @ least 30-45 minutes 3-4 times per week.  Take the EKG to any emergency room or preop visits. There are nonspecific changes; as long as there is no new change these are not clinically significant . If the old EKG is not available for comparison; it may result in unnecessary hospitalization for observation with significant unnecessary expense.   Plain Mucinex (NOT D) for thick secretions ;force NON dairy fluids .   Nasal cleansing in the shower as discussed with lather of mild shampoo.After 10 seconds wash off lather while  exhaling through nostrils. Make sure that all residual soap is removed to prevent irritation.  Flonase OR Nasacort AQ 1 spray in each nostril twice a day as needed. Use the "crossover" technique into opposite nostril spraying toward opposite ear @ 45 degree angle, not straight up into nostril.  Plain Allegra (NOT D )  160 daily , Loratidine 10 mg , OR Zyrtec 10 mg @ bedtime  as needed for itchy eyes & sneezing.

## 2015-03-05 ENCOUNTER — Telehealth: Payer: Self-pay | Admitting: Internal Medicine

## 2015-03-05 NOTE — Telephone Encounter (Signed)
MD place referral on 02/02/15 sending msg to [facility] to contact pt with status...Tiffany Moran

## 2015-03-05 NOTE — Telephone Encounter (Signed)
Patient stated that no one has called her to set up her stress test appt, It's been over a month, and she would prefer a female doctor.

## 2015-03-08 ENCOUNTER — Other Ambulatory Visit: Payer: Self-pay | Admitting: Internal Medicine

## 2015-04-20 ENCOUNTER — Encounter: Payer: BLUE CROSS/BLUE SHIELD | Admitting: Nurse Practitioner

## 2015-04-20 ENCOUNTER — Ambulatory Visit (INDEPENDENT_AMBULATORY_CARE_PROVIDER_SITE_OTHER): Payer: BLUE CROSS/BLUE SHIELD | Admitting: Nurse Practitioner

## 2015-04-20 ENCOUNTER — Ambulatory Visit (INDEPENDENT_AMBULATORY_CARE_PROVIDER_SITE_OTHER): Payer: BLUE CROSS/BLUE SHIELD | Admitting: Radiology

## 2015-04-20 DIAGNOSIS — Z8679 Personal history of other diseases of the circulatory system: Secondary | ICD-10-CM | POA: Diagnosis not present

## 2015-04-20 DIAGNOSIS — R002 Palpitations: Secondary | ICD-10-CM | POA: Diagnosis not present

## 2015-04-20 DIAGNOSIS — R079 Chest pain, unspecified: Secondary | ICD-10-CM

## 2015-04-20 NOTE — Progress Notes (Signed)
Patient presents today for routine GXT. Referred by PCP - Dr. Linna Darner. Has had long standing palpitations. Reportedly with +MVP.  No chest pain. FH + for early CAD. No medicines held for procedure. Resting BP 165/87. Has not been monitoring. Last cardiac work up over 5 years ago.   Today the patient exercised on the standard Bruce protocol for a total of 5:30 minutes.  Poor and reduced exercise tolerance.  Mildly hypertensive blood pressure response.  Clinically negative for chest pain. Noted that her hands felt sweaty and she was anxious. Test was stopped due to fatigue, achievement of target HR.  EKG with ST depression noted. No chest pain. No significant arrhythmia noted but rare PACs noted.   Recommendations:  Stress Myoview Echocardiogram  Further disposition to follow.  She would like to see Dr. Sheliah Hatch if needed.  Patient is agreeable to this plan and will call if any problems develop in the interim.   Burtis Junes, RN, [facility] [address] [place], [place]  [address] [phone]

## 2015-04-22 LAB — EXERCISE TOLERANCE TEST
CHL CUP RESTING HR STRESS: 75 {beats}/min
CHL CUP STRESS STAGE 1 GRADE: 0 %
CHL CUP STRESS STAGE 1 SPEED: 0 mph
CHL CUP STRESS STAGE 2 HR: 88 {beats}/min
CHL CUP STRESS STAGE 2 SPEED: 1 mph
CHL CUP STRESS STAGE 3 GRADE: 0.1 %
CHL CUP STRESS STAGE 4 SPEED: 1.7 mph
CHL CUP STRESS STAGE 5 SBP: 197 mmHg
CHL CUP STRESS STAGE 5 SPEED: 2.5 mph
CHL CUP STRESS STAGE 6 HR: 131 {beats}/min
CHL CUP STRESS STAGE 6 SBP: 191 mmHg
CHL CUP STRESS STAGE 7 GRADE: 0 %
CSEPEW: 7 METS
Exercise duration (min): 5 min
Exercise duration (sec): 30 s
MPHR: 174 {beats}/min
Peak BP: 197 mmHg
Peak HR: 153 {beats}/min
Percent HR: 87 %
Percent of predicted max HR: 95 %
RPE: 17
Stage 1 DBP: 87 mmHg
Stage 1 HR: 93 {beats}/min
Stage 1 SBP: 165 mmHg
Stage 2 Grade: 0 %
Stage 3 HR: 88 {beats}/min
Stage 3 Speed: 1 mph
Stage 4 Grade: 10 %
Stage 4 HR: 141 {beats}/min
Stage 5 DBP: 107 mmHg
Stage 5 Grade: 12 %
Stage 5 HR: 153 {beats}/min
Stage 6 DBP: 97 mmHg
Stage 6 Grade: 0 %
Stage 6 Speed: 0 mph
Stage 7 DBP: 92 mmHg
Stage 7 HR: 96 {beats}/min
Stage 7 SBP: 157 mmHg
Stage 7 Speed: 0 mph

## 2015-04-29 ENCOUNTER — Telehealth (HOSPITAL_COMMUNITY): Payer: Self-pay

## 2015-04-29 NOTE — Telephone Encounter (Signed)
Patient given detailed instructions per Myocardial Perfusion Study Information Sheet for test on 05-03-2015 at 9:15am.  Patient verbalized understanding. Oletta Lamas, [place] A

## 2015-05-03 ENCOUNTER — Ambulatory Visit (HOSPITAL_COMMUNITY): Payer: BLUE CROSS/BLUE SHIELD | Attending: Nurse Practitioner

## 2015-05-03 ENCOUNTER — Other Ambulatory Visit: Payer: Self-pay

## 2015-05-03 ENCOUNTER — Ambulatory Visit (HOSPITAL_BASED_OUTPATIENT_CLINIC_OR_DEPARTMENT_OTHER): Payer: BLUE CROSS/BLUE SHIELD

## 2015-05-03 DIAGNOSIS — R079 Chest pain, unspecified: Secondary | ICD-10-CM

## 2015-05-03 DIAGNOSIS — R0609 Other forms of dyspnea: Secondary | ICD-10-CM | POA: Diagnosis not present

## 2015-05-03 DIAGNOSIS — I341 Nonrheumatic mitral (valve) prolapse: Secondary | ICD-10-CM | POA: Insufficient documentation

## 2015-05-03 DIAGNOSIS — R9439 Abnormal result of other cardiovascular function study: Secondary | ICD-10-CM | POA: Insufficient documentation

## 2015-05-03 DIAGNOSIS — E785 Hyperlipidemia, unspecified: Secondary | ICD-10-CM | POA: Diagnosis not present

## 2015-05-03 DIAGNOSIS — Z8249 Family history of ischemic heart disease and other diseases of the circulatory system: Secondary | ICD-10-CM | POA: Insufficient documentation

## 2015-05-03 DIAGNOSIS — R55 Syncope and collapse: Secondary | ICD-10-CM | POA: Diagnosis not present

## 2015-05-03 LAB — MYOCARDIAL PERFUSION IMAGING
Estimated workload: 9.3 METS
Exercise duration (min): 7 min
Exercise duration (sec): 30 s
LV dias vol: 62 mL
LV sys vol: 19 mL
MPHR: 161 {beats}/min
Nuc Stress EF: 69 %
Peak BP: 175 mmHg
Peak HR: 137 {beats}/min
Percent HR: 86 %
Percent of predicted max HR: 85 %
RATE: 0.26
RPE: 15
Rest HR: 67 {beats}/min
SDS: 2
SRS: 8
SSS: 10
Stage 1 DBP: 85 mmHg
Stage 1 Grade: 0 %
Stage 1 HR: 67 {beats}/min
Stage 1 SBP: 122 mmHg
Stage 1 Speed: 0 mph
Stage 2 DBP: 89 mmHg
Stage 2 Grade: 0 %
Stage 2 HR: 78 {beats}/min
Stage 2 SBP: 132 mmHg
Stage 2 Speed: 0 mph
Stage 3 Grade: 0 %
Stage 3 HR: 77 {beats}/min
Stage 3 Speed: 1 mph
Stage 4 Grade: 0.2 %
Stage 4 HR: 78 {beats}/min
Stage 4 Speed: 1.1 mph
Stage 5 DBP: 81 mmHg
Stage 5 Grade: 10 %
Stage 5 HR: 115 {beats}/min
Stage 5 SBP: 168 mmHg
Stage 5 Speed: 1.7 mph
Stage 6 Grade: 12 %
Stage 6 HR: 129 {beats}/min
Stage 6 Speed: 2.5 mph
Stage 7 DBP: 76 mmHg
Stage 7 Grade: 14 %
Stage 7 HR: 137 {beats}/min
Stage 7 SBP: 175 mmHg
Stage 7 Speed: 3.4 mph
Stage 8 DBP: 72 mmHg
Stage 8 Grade: 0 %
Stage 8 HR: 110 {beats}/min
Stage 8 SBP: 160 mmHg
Stage 8 Speed: 0 mph
Stage 9 DBP: 74 mmHg
Stage 9 Grade: 0 %
Stage 9 HR: 76 {beats}/min
Stage 9 SBP: 118 mmHg
Stage 9 Speed: 0 mph
TID: 0.94

## 2015-05-03 MED ORDER — TECHNETIUM TC 99M SESTAMIBI GENERIC - CARDIOLITE
33.0000 | Freq: Once | INTRAVENOUS | Status: AC | PRN
  Administered 2015-05-03: 33 via INTRAVENOUS

## 2015-05-03 MED ORDER — TECHNETIUM TC 99M SESTAMIBI GENERIC - CARDIOLITE
11.0000 | Freq: Once | INTRAVENOUS | Status: AC | PRN
  Administered 2015-05-03: 11 via INTRAVENOUS

## 2015-05-07 ENCOUNTER — Ambulatory Visit (INDEPENDENT_AMBULATORY_CARE_PROVIDER_SITE_OTHER): Payer: BLUE CROSS/BLUE SHIELD | Admitting: Internal Medicine

## 2015-05-07 ENCOUNTER — Encounter: Payer: Self-pay | Admitting: *Deleted

## 2015-05-07 ENCOUNTER — Encounter: Payer: Self-pay | Admitting: Internal Medicine

## 2015-05-07 VITALS — BP 118/62 | HR 79 | Ht 66.0 in | Wt 143.0 lb

## 2015-05-07 DIAGNOSIS — I471 Supraventricular tachycardia: Secondary | ICD-10-CM

## 2015-05-07 DIAGNOSIS — R931 Abnormal findings on diagnostic imaging of heart and coronary circulation: Secondary | ICD-10-CM | POA: Diagnosis not present

## 2015-05-07 DIAGNOSIS — R9439 Abnormal result of other cardiovascular function study: Secondary | ICD-10-CM

## 2015-05-07 LAB — BASIC METABOLIC PANEL
BUN: 15 mg/dL (ref 6–23)
CO2: 30 mEq/L (ref 19–32)
CREATININE: 0.8 mg/dL (ref 0.40–1.20)
Calcium: 9.8 mg/dL (ref 8.4–10.5)
Chloride: 104 mEq/L (ref 96–112)
GFR: 77.89 mL/min (ref >60.00)
GLUCOSE: 98 mg/dL (ref 70–99)
POTASSIUM: 4.2 meq/L (ref 3.5–5.1)
Sodium: 139 mEq/L (ref 135–145)

## 2015-05-07 NOTE — Patient Instructions (Signed)
Your physician recommends that you continue on your current medications as directed. Please refer to the Current Medication list given to you today. Your physician recommends that you have work today to check your BMET. Your physician has requested that you have cardiac CT. Cardiac computed tomography (CT) is a painless test that uses an x-ray machine to take clear, detailed pictures of your heart. For further information please visit [email or web address]. Please follow instruction sheet as given.  You follow up appointment will be based off of your cardiac CT.

## 2015-05-07 NOTE — Progress Notes (Signed)
Cardiology Office Note   Date:  05/07/2015   ID:  Tiffany Moran, DOB [birth year], MRN [id number]  PCP:  Unice Cobble, MD  Cardiologist:   Dorris Carnes, MD   No chief complaint on file.  Patient presents for evaluation of abnormal stress myoview     History of Present Illness: Tiffany Moran is a 59 y.o. female with a history of Palpitations   Followed by [facility].  Had GXT  Exercised 5 min 30 sec.  EKG with ST depression  Set up for myoview and echo   Echo showed mild MVP  Normal LV function   Stress myovew was electrically positive  Myvoew scan showed partially reversible defect in the infero septal wall  Moderate in severity    The patient has a long standing history of palpitations.  Some she describes as isolated skips  Some are sustained  She had one spell in past that required adenosine  Yrs ago  She will have shorter spells that she stops by coughing, bearing down.  She denies CP  Walks regularly  Says that she gets some SOB with hills  Notes no change in this however    Current Outpatient Prescriptions  Medication Sig Dispense Refill  . ALPRAZolam (XANAX) 0.5 MG tablet Take 1 tablet (0.5 mg total) by mouth at bedtime as needed for sleep. 30 tablet 0  . clindamycin (CLEOCIN T) 1 % lotion Apply 60 g topically.    . Diclofenac Sodium (PENNSAID) 2 % SOLN Place 2 application onto the skin 2 (two) times daily. 112 g 3  . estradiol (ESTRACE) 0.1 MG/GM vaginal cream 1 gm twice weekly, maintain with 1/2 gm twice weekly 42.5 g 3  . meclizine (ANTIVERT) 12.5 MG tablet Take 1 tablet (12.5 mg total) by mouth 3 (three) times daily as needed for dizziness. 30 tablet 0  . metoprolol succinate (TOPROL-XL) 25 MG 24 hr tablet Take 1 tablet (25 mg total) by mouth daily. 90 tablet 3   No current facility-administered medications for this visit.    Allergies:   Review of patient's allergies indicates no known allergies.   Past Medical History  Diagnosis Date  . Hyperlipidemia   . Shingles  2012    leg  . Mitral valve prolapse   . Cardiospasm 1983    felt may be related to MVP    Past Surgical History  Procedure Laterality Date  . Tubal ligation    . Exploratory laparotomy      X2 for pain: fibroid, enlarged oviduct  . Dilation and curettage of uterus    . Salpingectomy  2007 or 2008    and fibroid removed  . Colonoscopy  2007    negatve, [name]     Social History:  The patient  reports that she has never smoked. She has never used smokeless tobacco. She reports that she does not drink alcohol or use illicit drugs.   Family History:  The patient's family history includes Heart attack (age of onset: 74) in her father; Hyperlipidemia in her father and mother; Hypertension in her father and mother; Hypothyroidism in her mother; Leukemia (age of onset: 72) in her mother; Myasthenia gravis in her father; Osteoporosis in her father; Prostate cancer in her brother; Stroke in her maternal grandfather. There is no history of Diabetes.    ROS:  Please see the history of present illness. All other systems are reviewed and  Negative to the above problem except as noted.    PHYSICAL EXAM:  VS:  BP 118/62 mmHg  Pulse 79  Ht 5\' 6"  (1.676 m)  Wt 143 lb (64.864 kg)  BMI 23.09 kg/m2  SpO2 99%  LMP 06/17/2009  GEN: Well nourished, well developed, in no acute distress HEENT: normal Neck: no JVD, carotid bruits, or masses Cardiac: RRR; no murmurs or clicks or rubs, or gallops,no edema  Respiratory:  clear to auscultation bilaterally, normal work of breathing GI: soft, nontender, nondistended, + BS  No hepatomegaly  MS: no deformity Moving all extremities   Skin: warm and dry, no rash Neuro:  Strength and sensation are intact Psych: euthymic mood, full affect   EKG:  EKG is not ordered today.   Lipid Panel    Component Value Date/Time   CHOL 239* 05/19/2014 0859   TRIG 101 05/19/2014 0859   HDL 71 05/19/2014 0859   CHOLHDL 3.4 05/19/2014 0859   VLDL 20  05/19/2014 0859   LDLCALC 148* 05/19/2014 0859      Wt Readings from Last 3 Encounters:  05/07/15 143 lb (64.864 kg)  05/03/15 143 lb (64.864 kg)  02/02/15 145 lb (65.772 kg)      ASSESSMENT AND PLAN:  1.  SOB  Patient has had a stress myoview that is abnormal  I have reviewed raw and processed images   I am not convinced the patients symptoms represent an anginal equivalent.  I am concerned that the EKG and myoview changes are false positives   The patient is very anxious  I would not recomm a cardiac catheterization but I would recomm a cardiac CT angiogram to define anatomy.  For now activities as tolerated   2  HL  LDL is high at 148  Discussed diet  Further intervention will be based on test findings.    3.  MVP  NO clicks or murmur on exam.  No signif MR on echo  Follow clinicially     Signed, Dorris Carnes, MD  05/07/2015 9:01 AM    [address] [place], [place], [place]  [address] Phone: [phone]; Fax: [phone]

## 2015-05-12 ENCOUNTER — Telehealth: Payer: Self-pay | Admitting: Internal Medicine

## 2015-05-12 NOTE — Telephone Encounter (Signed)
New Message ° ° ° ° ° ° °Pt calling to get lab results. Please call back and advise. °

## 2015-05-12 NOTE — Telephone Encounter (Signed)
Contacted the pt to inform her that her Pre CT labs were normal per Dr Harrington Challenger.  Pt verbalized understanding.

## 2015-05-24 ENCOUNTER — Encounter: Payer: Self-pay | Admitting: Nurse Practitioner

## 2015-05-24 ENCOUNTER — Ambulatory Visit (INDEPENDENT_AMBULATORY_CARE_PROVIDER_SITE_OTHER): Payer: BLUE CROSS/BLUE SHIELD | Admitting: Nurse Practitioner

## 2015-05-24 VITALS — BP 110/80 | HR 68 | Resp 16 | Ht 65.75 in | Wt 143.0 lb

## 2015-05-24 DIAGNOSIS — Z01419 Encounter for gynecological examination (general) (routine) without abnormal findings: Secondary | ICD-10-CM

## 2015-05-24 DIAGNOSIS — Z1211 Encounter for screening for malignant neoplasm of colon: Secondary | ICD-10-CM | POA: Diagnosis not present

## 2015-05-24 DIAGNOSIS — Z0000 Encounter for general adult medical examination without abnormal findings: Secondary | ICD-10-CM

## 2015-05-24 LAB — POCT URINALYSIS DIPSTICK
BILIRUBIN UA: NEGATIVE
Blood, UA: NEGATIVE
GLUCOSE UA: NEGATIVE
Ketones, UA: NEGATIVE
Leukocytes, UA: NEGATIVE
Nitrite, UA: NEGATIVE
PROTEIN UA: NEGATIVE
UROBILINOGEN UA: NEGATIVE
pH, UA: 7

## 2015-05-24 MED ORDER — CLINDAMYCIN PHOSPHATE 1 % EX LOTN: 60.0000 g | TOPICAL_LOTION | CUTANEOUS | Status: DC | PRN

## 2015-05-24 NOTE — Patient Instructions (Signed)

## 2015-05-24 NOTE — Progress Notes (Signed)
59 y.o. G2P2 Married  Caucasian Fe here for annual exam.  Recent evaluation for palpitations and did not have a good stress test.  She then saw cardiologist and will be having a CT of th heart on 6/14.  She only uses the estrace cram prn.  She needs a refill on cleocin lotion that she uses underarms for infections prn.  Patient's last menstrual period was 06/17/2009.          Sexually active: Yes.    The current method of family planning is tubal ligation and post menopausal status.    Exercising: Yes.    Walking Smoker:  no  Health Maintenance: Pap:  05/19/14 Neg. HR HPV:neg MMG:  06/02/14 BIRADS1:Neg Self Breast Exam: No Colonoscopy: 01/30/2006 Normal  BMD:  06/02/14 TDaP: 2013 Labs: Will come back for fasting labs. UA: negative   reports that she has never smoked. She has never used smokeless tobacco. She reports that she does not drink alcohol or use illicit drugs.  Past Medical History  Diagnosis Date  . Hyperlipidemia   . Shingles 2012    leg  . Mitral valve prolapse   . Cardiospasm 1983    felt may be related to MVP    Past Surgical History  Procedure Laterality Date  . Tubal ligation    . Exploratory laparotomy      X2 for pain: fibroid, enlarged oviduct  . Dilation and curettage of uterus    . Salpingectomy  2007 or 2008    and fibroid removed  . Colonoscopy  2007    negatve, [place] ,[name]    Current Outpatient Prescriptions  Medication Sig Dispense Refill  . ALPRAZolam (XANAX) 0.5 MG tablet Take 1 tablet (0.5 mg total) by mouth at bedtime as needed for sleep. 30 tablet 0  . clindamycin (CLEOCIN T) 1 % lotion Apply 60 g topically as needed. Does not need now 60 mL 1  . estradiol (ESTRACE) 0.1 MG/GM vaginal cream 1 gm twice weekly, maintain with 1/2 gm twice weekly (Patient taking differently: as needed. 1 gm twice weekly, maintain with 1/2 gm twice weekly) 42.5 g 3  . meclizine (ANTIVERT) 12.5 MG tablet Take 1 tablet (12.5 mg total) by mouth 3 (three) times daily as  needed for dizziness. 30 tablet 0  . metoprolol succinate (TOPROL-XL) 25 MG 24 hr tablet Take 1 tablet (25 mg total) by mouth daily. 90 tablet 3   No current facility-administered medications for this visit.    Family History  Problem Relation Age of Onset  . Heart attack Father 40  . Myasthenia gravis Father   . Hypertension Father   . Hyperlipidemia Father   . Osteoporosis Father     due to steroids for Myasthenia Grvis  . Leukemia Mother 45  . Hyperlipidemia Mother   . Hypertension Mother   . Hypothyroidism Mother   . Prostate cancer Brother     in 70s  . Stroke Maternal Grandfather     in 53s  . Diabetes Neg Hx     ROS:  Pertinent items are noted in HPI.  Otherwise, a comprehensive ROS was negative.  Exam:   BP 110/80 mmHg  Pulse 68  Resp 16  Ht 5' 5.75" (1.67 m)  Wt 143 lb (64.864 kg)  BMI 23.26 kg/m2  LMP 06/17/2009 Height: 5' 5.75" (167 cm) Ht Readings from Last 3 Encounters:  05/24/15 5' 5.75" (1.67 m)  05/07/15 5\' 6"  (1.676 m)  05/03/15 5\' 6"  (1.676 m)  General appearance: alert, cooperative and appears stated age Head: Normocephalic, without obvious abnormality, atraumatic Neck: no adenopathy, supple, symmetrical, trachea midline and thyroid normal to inspection and palpation Lungs: clear to auscultation bilaterally Breasts: normal appearance, no masses or tenderness Heart: regular rate and rhythm Abdomen: soft, non-tender; no masses,  no organomegaly Extremities: extremities normal, atraumatic, no cyanosis or edema Skin: Skin color, texture, turgor normal. No rashes or lesions Lymph nodes: Cervical, supraclavicular, and axillary nodes normal. No abnormal inguinal nodes palpated Neurologic: Grossly normal   Pelvic: External genitalia:  no lesions              Urethra:  normal appearing urethra with no masses, tenderness or lesions              Bartholin's and Skene's: normal                 Vagina: normal appearing vagina with normal color and  discharge, no lesions              Cervix: anteverted              Pap taken: Yes.   per request Bimanual Exam:  Uterus:  normal size, contour, position, consistency, mobility, non-tender              Adnexa: no mass, fullness, tenderness               Rectovaginal: Confirms               Anus:  normal sphincter tone, no lesions  Chaperone present: Yes  A:  Well Woman with normal exam  Postmenopausal Atrophic vaginitis using Estrace only prn  History of MVP - will be having further test to evaluate   P:   Reviewed health and wellness pertinent to exam  Pap smear as above  Mammogram is due 6/16  Refill Cleocin lotion prn for flares of infection under the arms  Refill Estrace cream to use prn to the urethra and/ or vaginally prn use  Counseled with risk of CVA, DVT, cancer, etc.  IFOB is given  Counseled on breast self exam, mammography screening, use and side effects of HRT, adequate intake of calcium and vitamin D, diet and exercise, Kegel's exercises return annually or prn  An After Visit Summary was printed and given to the patient.

## 2015-05-25 ENCOUNTER — Other Ambulatory Visit (INDEPENDENT_AMBULATORY_CARE_PROVIDER_SITE_OTHER): Payer: BLUE CROSS/BLUE SHIELD

## 2015-05-25 DIAGNOSIS — Z0000 Encounter for general adult medical examination without abnormal findings: Secondary | ICD-10-CM

## 2015-05-25 LAB — LIPID PANEL
Cholesterol: 283 mg/dL — ABNORMAL HIGH (ref 0–200)
HDL: 81 mg/dL (ref >=46)
LDL Cholesterol: 178 mg/dL — ABNORMAL HIGH (ref 0–99)
Total CHOL/HDL Ratio: 3.5 Ratio
Triglycerides: 118 mg/dL (ref <150)
VLDL: 24 mg/dL (ref 0–40)

## 2015-05-25 LAB — IPS PAP SMEAR ONLY

## 2015-05-25 LAB — TSH: TSH: 1.823 u[IU]/mL (ref 0.350–4.500)

## 2015-05-26 LAB — VITAMIN D 25 HYDROXY (VIT D DEFICIENCY, FRACTURES): Vit D, 25-Hydroxy: 26 ng/mL — ABNORMAL LOW (ref 30–100)

## 2015-05-26 NOTE — Progress Notes (Signed)
Encounter reviewed by Dr. Brook Silva.  

## 2015-05-29 ENCOUNTER — Encounter: Payer: Self-pay | Admitting: Nurse Practitioner

## 2015-05-30 ENCOUNTER — Other Ambulatory Visit: Payer: Self-pay | Admitting: Obstetrics & Gynecology

## 2015-05-30 MED ORDER — VITAMIN D (ERGOCALCIFEROL) 1.25 MG (50000 UNIT) PO CAPS: 50000.0000 [IU] | ORAL_CAPSULE | ORAL | Status: DC

## 2015-06-01 ENCOUNTER — Ambulatory Visit (HOSPITAL_COMMUNITY)
Admission: RE | Admit: 2015-06-01 | Discharge: 2015-06-01 | Disposition: A | Discharge status: Home / Self Care | Payer: BLUE CROSS/BLUE SHIELD | Source: Ambulatory Visit | Attending: Internal Medicine | Admitting: Internal Medicine

## 2015-06-01 ENCOUNTER — Encounter: Payer: Self-pay | Admitting: Internal Medicine

## 2015-06-01 DIAGNOSIS — R9439 Abnormal result of other cardiovascular function study: Secondary | ICD-10-CM

## 2015-06-01 DIAGNOSIS — R931 Abnormal findings on diagnostic imaging of heart and coronary circulation: Secondary | ICD-10-CM | POA: Diagnosis not present

## 2015-06-01 DIAGNOSIS — I471 Supraventricular tachycardia: Secondary | ICD-10-CM | POA: Diagnosis not present

## 2015-06-01 LAB — FECAL OCCULT BLOOD, IMMUNOCHEMICAL: IFOBT: NEGATIVE

## 2015-06-01 MED ORDER — METOPROLOL TARTRATE 1 MG/ML IV SOLN
5.0000 mg | Freq: Once | INTRAVENOUS | Status: AC
  Administered 2015-06-01: 5 mg via INTRAVENOUS
  Filled 2015-06-01: qty 5

## 2015-06-01 MED ORDER — IOHEXOL 350 MG/ML SOLN
80.0000 mL | Freq: Once | INTRAVENOUS | Status: AC | PRN
  Administered 2015-06-01: 80 mL via INTRAVENOUS

## 2015-06-01 MED ORDER — METOPROLOL TARTRATE 1 MG/ML IV SOLN
INTRAVENOUS | Status: AC
  Administered 2015-06-01: 5 mg via INTRAVENOUS
  Filled 2015-06-01: qty 15

## 2015-06-01 MED ORDER — NITROGLYCERIN 0.4 MG SL SUBL
SUBLINGUAL_TABLET | SUBLINGUAL | Status: AC
  Filled 2015-06-01: qty 1

## 2015-06-01 NOTE — Addendum Note (Signed)
Addended by: Lowella Fairy on: 06/01/2015 10:43 AM   Modules accepted: Orders

## 2015-06-03 ENCOUNTER — Telehealth: Payer: Self-pay | Admitting: Internal Medicine

## 2015-06-03 ENCOUNTER — Encounter: Payer: Self-pay | Admitting: *Deleted

## 2015-06-03 DIAGNOSIS — E785 Hyperlipidemia, unspecified: Secondary | ICD-10-CM

## 2015-06-03 MED ORDER — ATORVASTATIN CALCIUM 10 MG PO TABS: 10.0000 mg | ORAL_TABLET | Freq: Every day | ORAL | Status: DC

## 2015-06-03 MED ORDER — ASPIRIN EC 81 MG PO TBEC: 81.0000 mg | DELAYED_RELEASE_TABLET | Freq: Every day | ORAL | Status: DC

## 2015-06-03 NOTE — Telephone Encounter (Signed)
Not sure how to send note  Please notify pt    CT confirms that blood flow to all areas of heart is good  There is mild plaquing. Nothing critical narrowings.  Very mild.

## 2015-06-03 NOTE — Telephone Encounter (Signed)
Advised Dr. Harrington Challenger replied to her email.  Subject: RE: Non-Urgent Medical Question         Your heart CT showed mild plaquing of the coronary arteries (mainly 1 vessel) No tight narrowings.    You need to 1. Stay on enteric coated 81 mg aspirin adn 2 Need to get cholesterol down LDL was 148.    Would start lipitor 10 mg Daily Check lpiids in 8 wks    Nurse will be in touch with you.     She will start lipitor. Scheduled labs for 07/26/15. She will start EC ASA 81 mg  She would like Dr. Harrington Challenger to clarify for her: "they were not able to visualize the bottom part of my heart on the nuclear test, that is why the CT was done, so did they see what they needed to -to determine that the bottom part of my heart is ok?" Advised again that one of the vessels showed mild plaquing with no tight narrowings.

## 2015-06-03 NOTE — Telephone Encounter (Signed)
New Message  Pt calling about CT results. Can also use work #: [phone], Please call back and discuss.

## 2015-06-03 NOTE — Telephone Encounter (Signed)
Sent via email to patient as per our phone conversation earlier today.

## 2015-06-07 ENCOUNTER — Encounter: Payer: Self-pay | Admitting: Nurse Practitioner

## 2015-06-08 NOTE — Progress Notes (Signed)
This encounter was created in error - please disregard.

## 2015-06-08 NOTE — Telephone Encounter (Signed)
Replied to patient via mychart

## 2015-06-09 ENCOUNTER — Other Ambulatory Visit (INDEPENDENT_AMBULATORY_CARE_PROVIDER_SITE_OTHER): Payer: BLUE CROSS/BLUE SHIELD

## 2015-06-09 ENCOUNTER — Other Ambulatory Visit: Payer: Self-pay | Admitting: Nurse Practitioner

## 2015-06-09 DIAGNOSIS — R5383 Other fatigue: Secondary | ICD-10-CM

## 2015-06-09 LAB — CBC WITH DIFFERENTIAL/PLATELET
Basophils Absolute: 0 10*3/uL (ref 0.0–0.1)
Basophils Relative: 0 % (ref 0–1)
Eosinophils Absolute: 0.1 10*3/uL (ref 0.0–0.7)
Eosinophils Relative: 2 % (ref 0–5)
HCT: 39.6 % (ref 36.0–46.0)
Hemoglobin: 13 g/dL (ref 12.0–15.0)
Lymphocytes Relative: 31 % (ref 12–46)
Lymphs Abs: 1.4 10*3/uL (ref 0.7–4.0)
MCH: 29.5 pg (ref 26.0–34.0)
MCHC: 32.8 g/dL (ref 30.0–36.0)
MCV: 89.8 fL (ref 78.0–100.0)
MONO ABS: 0.2 10*3/uL (ref 0.1–1.0)
MONOS PCT: 5 % (ref 3–12)
MPV: 10.8 fL (ref 8.6–12.4)
Neutro Abs: 2.8 10*3/uL (ref 1.7–7.7)
Neutrophils Relative %: 62 % (ref 43–77)
Platelets: 160 10*3/uL (ref 150–400)
RBC: 4.41 MIL/uL (ref 3.87–5.11)
RDW: 13.4 % (ref 11.5–15.5)
WBC: 4.5 10*3/uL (ref 4.0–10.5)

## 2015-06-09 NOTE — Telephone Encounter (Signed)
Patient called to schedule lab. Scheduled for this afternoon @130 . Pt agreeable.

## 2015-06-10 NOTE — Addendum Note (Signed)
Addended by: Graylon Good on: 06/10/2015 01:28 PM   Modules accepted: Miquel Dunn

## 2015-06-10 NOTE — Addendum Note (Signed)
Addended by: Abelino Derrick C on: 06/10/2015 12:51 PM   Modules accepted: Orders, SmartSet

## 2015-07-14 ENCOUNTER — Other Ambulatory Visit: Payer: Self-pay | Admitting: Nurse Practitioner

## 2015-07-14 NOTE — Telephone Encounter (Signed)
Medication refill request: Estrace Vaginal Cream Last AEX:  05/19/14 with PG Next AEX: 05/29/16 with PG Last MMG (if hormonal medication request): 06/11/15 bi-rads 1: negative  Refill authorized: 42.5 gm/3 rfs

## 2015-07-26 ENCOUNTER — Other Ambulatory Visit (INDEPENDENT_AMBULATORY_CARE_PROVIDER_SITE_OTHER): Payer: BLUE CROSS/BLUE SHIELD | Admitting: *Deleted

## 2015-07-26 DIAGNOSIS — E785 Hyperlipidemia, unspecified: Secondary | ICD-10-CM | POA: Diagnosis not present

## 2015-07-26 LAB — LIPID PANEL
CHOLESTEROL: 183 mg/dL (ref 0–200)
HDL: 61.1 mg/dL (ref >39.00)
LDL CALC: 102 mg/dL — AB (ref 0–99)
NonHDL: 121.46
Total CHOL/HDL Ratio: 3
Triglycerides: 97 mg/dL (ref 0.0–149.0)
VLDL: 19.4 mg/dL (ref 0.0–40.0)

## 2015-07-26 LAB — AST: AST: 19 U/L (ref 0–37)

## 2015-07-26 NOTE — Addendum Note (Signed)
Addended by: Eulis Foster on: 07/26/2015 07:38 AM   Modules accepted: Orders

## 2015-07-27 ENCOUNTER — Encounter: Payer: Self-pay | Admitting: Internal Medicine

## 2015-08-02 ENCOUNTER — Telehealth: Payer: Self-pay | Admitting: Internal Medicine

## 2015-08-02 DIAGNOSIS — E785 Hyperlipidemia, unspecified: Secondary | ICD-10-CM

## 2015-08-02 NOTE — Telephone Encounter (Signed)
New message      Talk to a nurse regarding her lab results.  She has the results, she has questions

## 2015-08-02 NOTE — Telephone Encounter (Signed)
DISCUSSED LABS  WITH PT. PT  WAS  ASKING  IF 100 POINT  DROP  IN CHOLESTEROL  WAS  POSSIBLE  WITH START OF  ATORVASTATIN  10 MG INFORMED  IS VERY  POSSIBLE  ALSO  HAS  CONCERNS   HAS  NOTICED  MEMORY  LOSS   CONSTIPATION AND   WORSENING  DRY MOUTH SINCE STARTING  MED   WOULD  LIKE TO  MAYBE  DECREASE  ATORVASTATIN  WILL FORWARD TO  DR  Harrington Challenger FOR  REVIEW  .Adonis Housekeeper

## 2015-08-08 NOTE — Telephone Encounter (Signed)
Just saw this note. I would recomm trial of Crestor 2.5 mg every day  (1/2 of 5)  Metabolized very different from lipitor.   Stop lipitor F/U lipids in 8 wks with AST

## 2015-08-09 MED ORDER — ROSUVASTATIN CALCIUM 5 MG PO TABS: 2.5000 mg | ORAL_TABLET | Freq: Every day | ORAL | Status: DC

## 2015-08-09 NOTE — Telephone Encounter (Signed)
Spoke with patient and reviewed Dr. Alan Ripper advice to stop Lipitor and start Crestor 2.5 mg daily.  I answered her questions regarding the reasons for this change and she verbalized understanding and agreement.  I scheduled patient for lab appointment f/u on 10/26 for AST.  Patient is aware Dr. Harrington Challenger wants to see her back April 2017.

## 2015-08-19 HISTORY — PX: RETINAL LASER PROCEDURE: SHX2339

## 2015-10-13 ENCOUNTER — Other Ambulatory Visit (INDEPENDENT_AMBULATORY_CARE_PROVIDER_SITE_OTHER): Payer: BLUE CROSS/BLUE SHIELD | Admitting: *Deleted

## 2015-10-13 DIAGNOSIS — E785 Hyperlipidemia, unspecified: Secondary | ICD-10-CM | POA: Diagnosis not present

## 2015-10-13 LAB — AST: AST: 22 U/L (ref 10–35)

## 2015-11-18 ENCOUNTER — Other Ambulatory Visit: Payer: Self-pay | Admitting: Gastroenterology

## 2015-11-18 ENCOUNTER — Telehealth: Payer: Self-pay | Admitting: Internal Medicine

## 2015-11-18 DIAGNOSIS — N281 Cyst of kidney, acquired: Secondary | ICD-10-CM

## 2015-11-18 DIAGNOSIS — R1011 Right upper quadrant pain: Secondary | ICD-10-CM

## 2015-11-18 HISTORY — DX: Cyst of kidney, acquired: N28.1

## 2015-11-18 MED ORDER — ALPRAZOLAM 0.5 MG PO TABS: 0.5000 mg | ORAL_TABLET | Freq: Three times a day (TID) | ORAL | Status: DC | PRN

## 2015-11-18 NOTE — Telephone Encounter (Signed)
1 q 8-12 hrs prn only  #15

## 2015-11-18 NOTE — Telephone Encounter (Signed)
MD will be in this afternoon @ 1 will hold for his response...Tiffany Moran

## 2015-11-18 NOTE — Telephone Encounter (Signed)
Pt is requesting a prescription for ALPRAZolam Duanne Moron) 0.5 MG tablet [id number] She states Dr. Linus Orn will prescribe this for her when she is traveling and she will be leaving tomorrow. [facility] [address]

## 2015-11-18 NOTE — Telephone Encounter (Signed)
Notified pt inform rx called [facility] had to leave on pharmacy vm left md authorization..../LMB

## 2015-11-23 ENCOUNTER — Ambulatory Visit
Admission: RE | Admit: 2015-11-23 | Discharge: 2015-11-23 | Disposition: A | Discharge status: Home / Self Care | Payer: BLUE CROSS/BLUE SHIELD | Source: Ambulatory Visit | Attending: Gastroenterology | Admitting: Gastroenterology

## 2015-11-23 DIAGNOSIS — R1011 Right upper quadrant pain: Secondary | ICD-10-CM

## 2015-11-25 ENCOUNTER — Encounter: Payer: Self-pay | Admitting: Internal Medicine

## 2015-11-25 ENCOUNTER — Ambulatory Visit (INDEPENDENT_AMBULATORY_CARE_PROVIDER_SITE_OTHER): Payer: BLUE CROSS/BLUE SHIELD | Admitting: Internal Medicine

## 2015-11-25 VITALS — BP 118/76 | HR 80 | Temp 98.6°F | Resp 16 | Ht 66.0 in | Wt 147.2 lb

## 2015-11-25 DIAGNOSIS — H6980 Other specified disorders of Eustachian tube, unspecified ear: Secondary | ICD-10-CM | POA: Insufficient documentation

## 2015-11-25 DIAGNOSIS — H6981 Other specified disorders of Eustachian tube, right ear: Secondary | ICD-10-CM

## 2015-11-25 DIAGNOSIS — J0100 Acute maxillary sinusitis, unspecified: Secondary | ICD-10-CM | POA: Diagnosis not present

## 2015-11-25 MED ORDER — AMOXICILLIN 875 MG PO TABS: 875.0000 mg | ORAL_TABLET | Freq: Two times a day (BID) | ORAL | Status: DC

## 2015-11-25 MED ORDER — METHYLPREDNISOLONE 4 MG PO TBPK: ORAL_TABLET | ORAL | Status: DC

## 2015-11-25 NOTE — Progress Notes (Signed)
Pre visit review using our clinic review tool, if applicable. No additional management support is needed unless otherwise documented below in the visit note. 

## 2015-11-25 NOTE — Progress Notes (Signed)
Subjective:  Patient ID: Tiffany Moran, female    DOB: [birth year]  Age: 59 y.o. MRN: [id number]  CC: Sinusitis   HPI Fe Agate presents for a 5 day history of popping and pressure in her right ear with sore throat, right facial pain, postnasal drip, fever and chills. She has been taking Sudafed which is offered some symptom relief.  Outpatient Prescriptions Prior to Visit  Medication Sig Dispense Refill  . ALPRAZolam (XANAX) 0.5 MG tablet Take 1 tablet (0.5 mg total) by mouth every 8 (eight) hours as needed for anxiety or sleep (Use every 8-12 hours as needed). 15 tablet 0  . aspirin EC 81 MG tablet Take 1 tablet (81 mg total) by mouth daily. 90 tablet 3  . clindamycin (CLEOCIN T) 1 % lotion Apply 60 g topically as needed. Does not need now 60 mL 1  . ESTRACE VAGINAL 0.1 MG/GM vaginal cream INSERT ONE-FOURTH APPLICATORFUL INTRAVAGINALLY AS NEEDED AS DIRECTED 42.5 g 3  . estradiol (ESTRACE) 0.1 MG/GM vaginal cream 1 gm twice weekly, maintain with 1/2 gm twice weekly (Patient taking differently: as needed. 1 gm twice weekly, maintain with 1/2 gm twice weekly) 42.5 g 3  . meclizine (ANTIVERT) 12.5 MG tablet Take 1 tablet (12.5 mg total) by mouth 3 (three) times daily as needed for dizziness. 30 tablet 0  . metoprolol succinate (TOPROL-XL) 25 MG 24 hr tablet Take 1 tablet (25 mg total) by mouth daily. 90 tablet 3  . rosuvastatin (CRESTOR) 5 MG tablet Take 0.5 tablets (2.5 mg total) by mouth daily. 45 tablet 3  . Vitamin D, Ergocalciferol, (DRISDOL) 50000 UNITS CAPS capsule Take 1 capsule (50,000 Units total) by mouth every 14 (fourteen) days. 6 capsule 4   No facility-administered medications prior to visit.    ROS Review of Systems  Constitutional: Positive for fever and chills. Negative for diaphoresis and fatigue.  HENT: Positive for congestion, ear pain, postnasal drip, rhinorrhea, sinus pressure and sore throat. Negative for ear discharge, facial swelling, sneezing, trouble swallowing  and voice change.   Eyes: Negative.  Negative for visual disturbance.  Respiratory: Negative.  Negative for cough, choking, chest tightness, shortness of breath and stridor.   Cardiovascular: Negative.  Negative for chest pain, palpitations and leg swelling.  Gastrointestinal: Negative.  Negative for abdominal pain.  Endocrine: Negative.   Genitourinary: Negative.   Musculoskeletal: Negative.   Skin: Negative.  Negative for rash.  Allergic/Immunologic: Negative.   Neurological: Negative.  Negative for dizziness.  Hematological: Negative.  Negative for adenopathy. Does not bruise/bleed easily.  Psychiatric/Behavioral: Negative.     Objective:  BP 118/76 mmHg  Pulse 80  Temp(Src) 98.6 F (37 C) (Oral)  Resp 16  Ht 5\' 6"  (1.676 m)  Wt 147 lb 4 oz (66.792 kg)  BMI 23.78 kg/m2  SpO2 98%  LMP 06/17/2009  BP Readings from Last 3 Encounters:  11/25/15 118/76  06/01/15 106/66  05/24/15 110/80    Wt Readings from Last 3 Encounters:  11/25/15 147 lb 4 oz (66.792 kg)  05/24/15 143 lb (64.864 kg)  05/07/15 143 lb (64.864 kg)    Physical Exam  Constitutional: She is oriented to person, place, and time. She appears well-developed and well-nourished.  Non-toxic appearance. She does not have a sickly appearance. She does not appear ill. No distress.  HENT:  Right Ear: Hearing, tympanic membrane, external ear and ear canal normal.  Left Ear: Hearing, tympanic membrane, external ear and ear canal normal.  Nose: Mucosal edema present. No rhinorrhea,  sinus tenderness or nasal deformity. Right sinus exhibits maxillary sinus tenderness. Right sinus exhibits no frontal sinus tenderness. Left sinus exhibits maxillary sinus tenderness. Left sinus exhibits no frontal sinus tenderness.  Mouth/Throat: Oropharynx is clear and moist and mucous membranes are normal. Mucous membranes are not pale, not dry and not cyanotic. No oral lesions. No trismus in the jaw. No uvula swelling. No oropharyngeal  exudate, posterior oropharyngeal edema, posterior oropharyngeal erythema or tonsillar abscesses.  Eyes: Conjunctivae are normal. Right eye exhibits no discharge. Left eye exhibits no discharge. No scleral icterus.  Neck: Normal range of motion. Neck supple. No JVD present. No tracheal deviation present. No thyromegaly present.  Cardiovascular: Normal rate, regular rhythm, normal heart sounds and intact distal pulses.  Exam reveals no gallop and no friction rub.   No murmur heard. Pulmonary/Chest: Effort normal and breath sounds normal. No stridor. No respiratory distress. She has no wheezes. She has no rales. She exhibits no tenderness.  Abdominal: Soft. Bowel sounds are normal. She exhibits no distension and no mass. There is no tenderness. There is no rebound and no guarding.  Musculoskeletal: Normal range of motion. She exhibits no edema or tenderness.  Lymphadenopathy:    She has no cervical adenopathy.  Neurological: She is oriented to person, place, and time.  Skin: Skin is warm and dry. No rash noted. She is not diaphoretic. No erythema. No pallor.  Vitals reviewed.   Lab Results  Component Value Date   WBC 4.5 06/09/2015   HGB 13.0 06/09/2015   HCT 39.6 06/09/2015   PLT 160 06/09/2015   GLUCOSE 98 05/07/2015   CHOL 183 07/26/2015   TRIG 97.0 07/26/2015   HDL 61.10 07/26/2015   LDLCALC 102* 07/26/2015   ALT 16 05/19/2014   AST 22 10/13/2015   NA 139 05/07/2015   K 4.2 05/07/2015   CL 104 05/07/2015   CREATININE 0.80 05/07/2015   BUN 15 05/07/2015   CO2 30 05/07/2015   TSH 1.823 05/25/2015    US Abdomen Complete  11/23/2015  CLINICAL DATA:  One week history of upper abdominal pain, primarily right upper quadrant EXAM: ULTRASOUND ABDOMEN COMPLETE COMPARISON:  None. FINDINGS: Gallbladder: No gallstones or wall thickening visualized. There is no pericholecystic fluid. No sonographic Murphy sign noted. Common bile duct: Diameter: 3 mm. There is no intrahepatic, common hepatic,  or common bile duct dilatation. Liver: There is an echogenic focus in the anterior segment right lobe of the liver measuring 1.5 x 1.8 x 1.9 cm. Within normal limits in parenchymal echogenicity. IVC: No abnormality visualized. Pancreas: No mass or inflammatory focus. Spleen: Size and appearance within normal limits. Right Kidney: Length: 13.3 cm. Echogenicity within normal limits. No hydronephrosis visualized. There is a cyst arising from the lower pole the right kidney measuring 7.2 x 4.5 x 6.4 cm. This cyst is slightly lobulated in contour. There is a cyst in the medial upper pole right kidney measuring 2.7 x 2.5 x 2.3 cm. A second cyst in the upper pole right kidney measures 2.4 x 2.0 x 1.9 cm. Left Kidney: Length: 12.9 cm. Echogenicity within normal limits. No mass or hydronephrosis visualized. Abdominal aorta: No aneurysm visualized. Other findings: No demonstrable ascites. IMPRESSION: Echogenic lesion in the anterior segment of the right lobe of the liver measuring 1.5 x 1.8 x 1.9 cm. This lesion probably represents a hemangioma. Advise follow-up ultrasound in approximately 1 year to assess for stability. Cysts in right kidney, largest measuring 7.2 x 4.5 x 6.4 cm. No noncystic renal  masses identified. Study otherwise unremarkable. Electronically Signed   By: Lowella Grip III M.D.   On: 11/23/2015 11:47    Assessment & Plan:   Gail was seen today for sinusitis.  Diagnoses and all orders for this visit:  Eustachian tube dysfunction, right- cont sudafed, start medrol dose pak for symptom relief -     methylPREDNISolone (MEDROL DOSEPAK) 4 MG TBPK tablet; TAKE AS DIRECTED  Acute maxillary sinusitis, recurrence not specified- will treat the infection with amoxil -     amoxicillin (AMOXIL) 875 MG tablet; Take 1 tablet (875 mg total) by mouth 2 (two) times daily.   I am having Ms. Goldman start on methylPREDNISolone and amoxicillin. I am also having her maintain her metoprolol succinate,  meclizine, estradiol, clindamycin, Vitamin D (Ergocalciferol), aspirin EC, ESTRACE VAGINAL, rosuvastatin, and ALPRAZolam.  Meds ordered this encounter  Medications  . methylPREDNISolone (MEDROL DOSEPAK) 4 MG TBPK tablet    Sig: TAKE AS DIRECTED    Dispense:  21 tablet    Refill:  0  . amoxicillin (AMOXIL) 875 MG tablet    Sig: Take 1 tablet (875 mg total) by mouth 2 (two) times daily.    Dispense:  20 tablet    Refill:  0     Follow-up: Return in about 3 weeks (around 12/16/2015).  Scarlette Calico, MD

## 2015-11-25 NOTE — Patient Instructions (Signed)

## 2015-12-02 ENCOUNTER — Telehealth: Payer: Self-pay | Admitting: *Deleted

## 2015-12-02 DIAGNOSIS — H6980 Other specified disorders of Eustachian tube, unspecified ear: Secondary | ICD-10-CM

## 2015-12-02 DIAGNOSIS — H6981 Other specified disorders of Eustachian tube, right ear: Secondary | ICD-10-CM

## 2015-12-02 MED ORDER — METHYLPREDNISOLONE 4 MG PO TBPK: ORAL_TABLET | ORAL | Status: DC

## 2015-12-02 NOTE — Telephone Encounter (Signed)
Left msg on triage stating saw md last week currently taking the antibiotic that was rx, but once finish up the prednisone her ears started back hurting. Wanting to know does md want to give another round of prednisone or recommend something...Johny Chess

## 2015-12-02 NOTE — Telephone Encounter (Signed)
Called pt no answer LMOM rx sent to [facility]....Tiffany Moran

## 2015-12-02 NOTE — Telephone Encounter (Signed)
Yes, another round was sent to her pharmacy

## 2015-12-06 ENCOUNTER — Telehealth: Payer: Self-pay | Admitting: Internal Medicine

## 2015-12-06 NOTE — Telephone Encounter (Signed)
Colonoscopy scheduled this Friday 12/10/15 GI requesting clearance.

## 2015-12-06 NOTE — Telephone Encounter (Signed)
OK to proceed with colonoscopy from cardiac standpoint.

## 2015-12-06 NOTE — Telephone Encounter (Signed)
New message     Request for surgical clearance:  1. What type of surgery is being performed? colonscopy  When is this surgery scheduled? 12-10-15 Are there any medications that need to be held prior to surgery and how long? Pt wanted GI office to call and get medical clearance---no rx to hold 2. Name of physician performing surgery? Dr Watt Climes  3. What is your office phone and fax number? [phone]

## 2016-02-08 ENCOUNTER — Telehealth: Payer: Self-pay | Admitting: Internal Medicine

## 2016-02-08 NOTE — Telephone Encounter (Signed)
New Message  Pt called states that she was supposed to have her cholesterol checked in April. Pt request a call back to discuss if this is still needed. Please call back to discuss.

## 2016-02-09 ENCOUNTER — Ambulatory Visit (INDEPENDENT_AMBULATORY_CARE_PROVIDER_SITE_OTHER): Payer: BLUE CROSS/BLUE SHIELD | Admitting: Internal Medicine

## 2016-02-09 ENCOUNTER — Encounter: Payer: Self-pay | Admitting: Internal Medicine

## 2016-02-09 VITALS — BP 140/80 | HR 64 | Temp 97.4°F | Resp 16 | Wt 146.0 lb

## 2016-02-09 DIAGNOSIS — M2550 Pain in unspecified joint: Secondary | ICD-10-CM

## 2016-02-09 DIAGNOSIS — Z0000 Encounter for general adult medical examination without abnormal findings: Secondary | ICD-10-CM

## 2016-02-09 DIAGNOSIS — H33301 Unspecified retinal break, right eye: Secondary | ICD-10-CM

## 2016-02-09 DIAGNOSIS — H33321 Round hole, right eye: Secondary | ICD-10-CM | POA: Insufficient documentation

## 2016-02-09 DIAGNOSIS — I471 Supraventricular tachycardia: Secondary | ICD-10-CM | POA: Diagnosis not present

## 2016-02-09 DIAGNOSIS — E785 Hyperlipidemia, unspecified: Secondary | ICD-10-CM

## 2016-02-09 NOTE — Patient Instructions (Addendum)
We have reviewed your prior records including labs and tests today.  Test(s) ordered today. Your results will be released to [facility] (or called to you) after review, usually within 72hours after test completion. If any changes need to be made, you will be notified at that same time.  All other Health Maintenance issues reviewed.   All recommended immunizations and age-appropriate screenings are up-to-date.  No immunizations administered today.   Medications reviewed and updated.  No changes recommended at this time.   Please followup annually   Health Maintenance, Female Adopting a healthy lifestyle and getting preventive care can go a long way to promote health and wellness. Talk with your health care provider about what schedule of regular examinations is right for you. This is a good chance for you to check in with your provider about disease prevention and staying healthy. In between checkups, there are plenty of things you can do on your own. Experts have done a lot of research about which lifestyle changes and preventive measures are most likely to keep you healthy. Ask your health care provider for more information. WEIGHT AND DIET  Eat a healthy diet  Be sure to include plenty of vegetables, fruits, low-fat dairy products, and lean protein.  Do not eat a lot of foods high in solid fats, added sugars, or salt.  Get regular exercise. This is one of the most important things you can do for your health.  Most adults should exercise for at least 150 minutes each week. The exercise should increase your heart rate and make you sweat (moderate-intensity exercise).  Most adults should also do strengthening exercises at least twice a week. This is in addition to the moderate-intensity exercise.  Maintain a healthy weight  Body mass index (BMI) is a measurement that can be used to identify possible weight problems. It estimates body fat based on height and weight. Your health care  provider can help determine your BMI and help you achieve or maintain a healthy weight.  For females 61 years of age and older:   A BMI below 18.5 is considered underweight.  A BMI of 18.5 to 24.9 is normal.  A BMI of 25 to 29.9 is considered overweight.  A BMI of 30 and above is considered obese.  Watch levels of cholesterol and blood lipids  You should start having your blood tested for lipids and cholesterol at 61 years of age, then have this test every 5 years.  You may need to have your cholesterol levels checked more often if:  Your lipid or cholesterol levels are high.  You are older than 61 years of age.  You are at high risk for heart disease.  CANCER SCREENING   Lung Cancer  Lung cancer screening is recommended for adults 69-30 years old who are at high risk for lung cancer because of a history of smoking.  A yearly low-dose CT scan of the lungs is recommended for people who:  Currently smoke.  Have quit within the past 15 years.  Have at least a 30-pack-year history of smoking. A pack year is smoking an average of one pack of cigarettes a day for 1 year.  Yearly screening should continue until it has been 15 years since you quit.  Yearly screening should stop if you develop a health problem that would prevent you from having lung cancer treatment.  Breast Cancer  Practice breast self-awareness. This means understanding how your breasts normally appear and feel.  It also means doing  regular breast self-exams. Let your health care provider know about any changes, no matter how small.  If you are in your 20s or 30s, you should have a clinical breast exam (CBE) by a health care provider every 1-3 years as part of a regular health exam.  If you are 53 or older, have a CBE every year. Also consider having a breast X-ray (mammogram) every year.  If you have a family history of breast cancer, talk to your health care provider about genetic screening.  If you  are at high risk for breast cancer, talk to your health care provider about having an MRI and a mammogram every year.  Breast cancer gene (BRCA) assessment is recommended for women who have family members with BRCA-related cancers. BRCA-related cancers include:  Breast.  Ovarian.  Tubal.  Peritoneal cancers.  Results of the assessment will determine the need for genetic counseling and BRCA1 and BRCA2 testing. Cervical Cancer Your health care provider may recommend that you be screened regularly for cancer of the pelvic organs (ovaries, uterus, and vagina). This screening involves a pelvic examination, including checking for microscopic changes to the surface of your cervix (Pap test). You may be encouraged to have this screening done every 3 years, beginning at age 47.  For women ages 78-65, health care providers may recommend pelvic exams and Pap testing every 3 years, or they may recommend the Pap and pelvic exam, combined with testing for human papilloma virus (HPV), every 5 years. Some types of HPV increase your risk of cervical cancer. Testing for HPV may also be done on women of any age with unclear Pap test results.  Other health care providers may not recommend any screening for nonpregnant women who are considered low risk for pelvic cancer and who do not have symptoms. Ask your health care provider if a screening pelvic exam is right for you.  If you have had past treatment for cervical cancer or a condition that could lead to cancer, you need Pap tests and screening for cancer for at least 20 years after your treatment. If Pap tests have been discontinued, your risk factors (such as having a new sexual partner) need to be reassessed to determine if screening should resume. Some women have medical problems that increase the chance of getting cervical cancer. In these cases, your health care provider may recommend more frequent screening and Pap tests. Colorectal Cancer  This type of  cancer can be detected and often prevented.  Routine colorectal cancer screening usually begins at 61 years of age and continues through 61 years of age.  Your health care provider may recommend screening at an earlier age if you have risk factors for colon cancer.  Your health care provider may also recommend using home test kits to check for hidden blood in the stool.  A small camera at the end of a tube can be used to examine your colon directly (sigmoidoscopy or colonoscopy). This is done to check for the earliest forms of colorectal cancer.  Routine screening usually begins at age 20.  Direct examination of the colon should be repeated every 5-10 years through 61 years of age. However, you may need to be screened more often if early forms of precancerous polyps or small growths are found. Skin Cancer  Check your skin from head to toe regularly.  Tell your health care provider about any new moles or changes in moles, especially if there is a change in a mole's shape or color.  Also tell your health care provider if you have a mole that is larger than the size of a pencil eraser.  Always use sunscreen. Apply sunscreen liberally and repeatedly throughout the day.  Protect yourself by wearing long sleeves, pants, a wide-brimmed hat, and sunglasses whenever you are outside. HEART DISEASE, DIABETES, AND HIGH BLOOD PRESSURE   High blood pressure causes heart disease and increases the risk of stroke. High blood pressure is more likely to develop in:  People who have blood pressure in the high end of the normal range (130-139/85-89 mm Hg).  People who are overweight or obese.  People who are African American.  If you are 65-70 years of age, have your blood pressure checked every 3-5 years. If you are 66 years of age or older, have your blood pressure checked every year. You should have your blood pressure measured twice--once when you are at a hospital or clinic, and once when you are  not at a hospital or clinic. Record the average of the two measurements. To check your blood pressure when you are not at a hospital or clinic, you can use:  An automated blood pressure machine at a pharmacy.  A home blood pressure monitor.  If you are between 83 years and 72 years old, ask your health care provider if you should take aspirin to prevent strokes.  Have regular diabetes screenings. This involves taking a blood sample to check your fasting blood sugar level.  If you are at a normal weight and have a low risk for diabetes, have this test once every three years after 61 years of age.  If you are overweight and have a high risk for diabetes, consider being tested at a younger age or more often. PREVENTING INFECTION  Hepatitis B  If you have a higher risk for hepatitis B, you should be screened for this virus. You are considered at high risk for hepatitis B if:  You were born in a country where hepatitis B is common. Ask your health care provider which countries are considered high risk.  Your parents were born in a high-risk country, and you have not been immunized against hepatitis B (hepatitis B vaccine).  You have HIV or AIDS.  You use needles to inject street drugs.  You live with someone who has hepatitis B.  You have had sex with someone who has hepatitis B.  You get hemodialysis treatment.  You take certain medicines for conditions, including cancer, organ transplantation, and autoimmune conditions. Hepatitis C  Blood testing is recommended for:  Everyone born from [birth year] through 1966.  Anyone with known risk factors for hepatitis C. Sexually transmitted infections (STIs)  You should be screened for sexually transmitted infections (STIs) including gonorrhea and chlamydia if:  You are sexually active and are younger than 61 years of age.  You are older than 61 years of age and your health care provider tells you that you are at risk for this type of  infection.  Your sexual activity has changed since you were last screened and you are at an increased risk for chlamydia or gonorrhea. Ask your health care provider if you are at risk.  If you do not have HIV, but are at risk, it may be recommended that you take a prescription medicine daily to prevent HIV infection. This is called pre-exposure prophylaxis (PrEP). You are considered at risk if:  You are sexually active and do not regularly use condoms or know the HIV status of your  partner(s).  You take drugs by injection.  You are sexually active with a partner who has HIV. Talk with your health care provider about whether you are at high risk of being infected with HIV. If you choose to begin PrEP, you should first be tested for HIV. You should then be tested every 3 months for as long as you are taking PrEP.  PREGNANCY   If you are premenopausal and you may become pregnant, ask your health care provider about preconception counseling.  If you may become pregnant, take 400 to 800 micrograms (mcg) of folic acid every day.  If you want to prevent pregnancy, talk to your health care provider about birth control (contraception). OSTEOPOROSIS AND MENOPAUSE   Osteoporosis is a disease in which the bones lose minerals and strength with aging. This can result in serious bone fractures. Your risk for osteoporosis can be identified using a bone density scan.  If you are 71 years of age or older, or if you are at risk for osteoporosis and fractures, ask your health care provider if you should be screened.  Ask your health care provider whether you should take a calcium or vitamin D supplement to lower your risk for osteoporosis.  Menopause may have certain physical symptoms and risks.  Hormone replacement therapy may reduce some of these symptoms and risks. Talk to your health care provider about whether hormone replacement therapy is right for you.  HOME CARE INSTRUCTIONS   Schedule regular  health, dental, and eye exams.  Stay current with your immunizations.   Do not use any tobacco products including cigarettes, chewing tobacco, or electronic cigarettes.  If you are pregnant, do not drink alcohol.  If you are breastfeeding, limit how much and how often you drink alcohol.  Limit alcohol intake to no more than 1 drink per day for nonpregnant women. One drink equals 12 ounces of beer, 5 ounces of wine, or 1 ounces of hard liquor.  Do not use street drugs.  Do not share needles.  Ask your health care provider for help if you need support or information about quitting drugs.  Tell your health care provider if you often feel depressed.  Tell your health care provider if you have ever been abused or do not feel safe at home.   This information is not intended to replace advice given to you by your health care provider. Make sure you discuss any questions you have with your health care provider.   Document Released: 06/19/2011 Document Revised: 12/25/2014 Document Reviewed: 11/05/2013 Elsevier Interactive Patient Education [employer].

## 2016-02-09 NOTE — Assessment & Plan Note (Signed)
Taking Crestor 5 mg daily Lipid panel, CMP ordered

## 2016-02-09 NOTE — Assessment & Plan Note (Signed)
Fairly controlled with metoprolol-continue same Follows with cardiology Avoids rigorous exercise

## 2016-02-09 NOTE — Progress Notes (Signed)
Subjective:    Patient ID: Tiffany Moran, female    DOB: 03/18/1956, 60 y.o.   MRN: [id number]  HPI She is here for a physical exam.   She has a few concerns. She has a rough spot on her nose that has been present for a few months and does not seem to be resolving. She has noticed that when she exercises and she is blow breathing. She knows this is an abnormal breathing and she thinks it occurs when her heart rate is elevated. She is able to correct this, but wonders if it's an issue. She tries not to overexert herself because it will cause palpitations. When the weather is colder she gets painful sores on her index fingers on the lateral aspects. She currently does not have, but wonders if it's related to her raynaud's syndrome.  For a while she has been experiencing right upper quadrant pain. The pain is in the right upper quadrant, lateral aspect of her abdomen and radiates to the back sometimes. She had an ultrasound of her abdomen that showed a liver hemangioma. The pain is random, lightheadedness, can last for 3 4 days. She tends to get more in the evening or at night. It is not associated with fatty or fried foods.  Right thumb soreness on occasion and swelling.  She wonders if this is arthritis or she should be concerned about it.  Right ear -her station to does not function well. Often when she flies she will get congestion, ear pressure, ear popping and has recently ended up with a sinus infection.      Medications and allergies reviewed with patient and updated if appropriate.  Patient Active Problem List   Diagnosis Date Noted  . Pes anserine bursitis 04/21/2014  . 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 Osteopenia 08/04/2011  . Unspecified vitamin D deficiency 08/04/2011  . History of mitral valve prolapse 07/26/2010  . 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 RAYNAUD'S SYNDROME 01/05/2010  . Hyperlipidemia 08/17/2009  . POLYARTHRITIS 08/17/2009    Current  Outpatient Prescriptions on File Prior to Visit  Medication Sig Dispense Refill  . ALPRAZolam (XANAX) 0.5 MG tablet Take 1 tablet (0.5 mg total) by mouth every 8 (eight) hours as needed for anxiety or sleep (Use every 8-12 hours as needed). 15 tablet 0  . aspirin EC 81 MG tablet Take 1 tablet (81 mg total) by mouth daily. 90 tablet 3  . clindamycin (CLEOCIN T) 1 % lotion Apply 60 g topically as needed. Does not need now 60 mL 1  . ESTRACE VAGINAL 0.1 MG/GM vaginal cream INSERT ONE-FOURTH APPLICATORFUL INTRAVAGINALLY AS NEEDED AS DIRECTED 42.5 g 3  . estradiol (ESTRACE) 0.1 MG/GM vaginal cream 1 gm twice weekly, maintain with 1/2 gm twice weekly (Patient taking differently: as needed. 1 gm twice weekly, maintain with 1/2 gm twice weekly) 42.5 g 3  . meclizine (ANTIVERT) 12.5 MG tablet Take 1 tablet (12.5 mg total) by mouth 3 (three) times daily as needed for dizziness. 30 tablet 0  . metoprolol succinate (TOPROL-XL) 25 MG 24 hr tablet Take 1 tablet (25 mg total) by mouth daily. 90 tablet 3  . rosuvastatin (CRESTOR) 5 MG tablet Take 0.5 tablets (2.5 mg total) by mouth daily. 45 tablet 3  . Vitamin D, Ergocalciferol, (DRISDOL) 50000 UNITS CAPS capsule Take 1 capsule (50,000 Units total) by mouth every 14 (fourteen) days. 6 capsule 4   No current facility-administered medications on file prior to visit.  Past Medical History  Diagnosis Date  . Hyperlipidemia   . Shingles 2012    leg  . Mitral valve prolapse   . Cardiospasm 1983    felt may be related to MVP    Past Surgical History  Procedure Laterality Date  . Tubal ligation    . Exploratory laparotomy      X2 for pain: fibroid, enlarged oviduct  . Dilation and curettage of uterus    . Salpingectomy  2007 or 2008    and fibroid removed  . Colonoscopy  2007    negatve, [place] ,[name]    Social History   Social History  . Marital Status: Married    Spouse Name: N/A  . Number of Children: N/A  . Years of Education: N/A    Social History Main Topics  . Smoking status: Never Smoker   . Smokeless tobacco: Never Used  . Alcohol Use: No  . Drug Use: No  . Sexual Activity: Yes    Birth Control/ Protection: Other-see comments, Post-menopausal     Comment: BTL   Other Topics Concern  . None   Social History Narrative    Family History  Problem Relation Age of Onset  . Heart attack Father 51  . Myasthenia gravis Father   . Hypertension Father   . Hyperlipidemia Father   . Osteoporosis Father     due to steroids for Myasthenia Grvis  . Leukemia Mother 44  . Hyperlipidemia Mother   . Hypertension Mother   . Hypothyroidism Mother   . Prostate cancer Brother     in 89s  . Stroke Maternal Grandfather     in 24s  . Diabetes Neg Hx     Review of Systems  Constitutional: Positive for fatigue. Negative for fever, chills and appetite change.  HENT: Negative for hearing loss.   Eyes: Positive for visual disturbance.  Respiratory: Positive for shortness of breath (occasional with exertion). Negative for cough and wheezing.   Cardiovascular: Positive for palpitations (occasional) and leg swelling (occasional, minimal in ankles). Negative for chest pain.  Gastrointestinal: Positive for abdominal pain (RUQ). Negative for nausea, diarrhea, constipation and blood in stool.       No GERD  Genitourinary: Negative for dysuria and hematuria.  Musculoskeletal: Positive for arthralgias (thumb pain).  Neurological: Positive for light-headedness (positional). Negative for dizziness and headaches.  Psychiatric/Behavioral: Negative for sleep disturbance and dysphoric mood. The patient is not nervous/anxious.        Objective:   Filed Vitals:   02/09/16 0901  BP: 140/80  Pulse: 64  Temp: 97.4 F (36.3 C)  Resp: 16   Filed Weights   02/09/16 0901  Weight: 146 lb (66.225 kg)   Body mass index is 23.58 kg/(m^2).   Physical Exam Constitutional: She appears well-developed and well-nourished. No distress.   HENT:  Head: Normocephalic and atraumatic.  Right Ear: External ear normal. Normal ear canal and TM Left Ear: External ear normal.  Normal ear canal and TM Mouth/Throat: Oropharynx is clear and moist.  Normal bilateral ear canals and tympanic membranes  Eyes: Conjunctivae and EOM are normal.  Neck: Neck supple. No tracheal deviation present. No thyromegaly present.  No carotid bruit  Cardiovascular: Normal rate, regular rhythm and normal heart sounds.   No murmur heard.  No edema. Pulmonary/Chest: Effort normal and breath sounds normal. No respiratory distress. She has no wheezes. She has no rales.  Breast: deferred to Gyn Abdominal: Soft. She exhibits no distension. There is no tenderness.  Lymphadenopathy: She has no cervical adenopathy.  Skin: Skin is warm and dry. She is not diaphoretic.  dry patch on nose without erythema.  Psychiatric: She has a normal mood and affect. Her behavior is normal.         Assessment & Plan:   Physical exam: Screening blood work ordered, which she will get done in a month or 2 Immunizations -up-to-date. Discussed shingles vaccine. She has had shingles and we discussed that the vaccine may help further treatment for recurrence. She may get a couple of years Colonoscopy done 12/10/15 Mammogram  up-to-date Gyn  up to date Dexa-up-to-date Eye exams  follows regularly EKG-done last year Exercise -- exercises regularly Weight-normal, BMI normal Skin -advised seeing a dermatologist for further evaluation of the dry patch on her nose Substance abuse-no concerns with substance abuse   See Problem List for Assessment and Plan of chronic medical problems.  Follow up annually

## 2016-02-09 NOTE — Progress Notes (Signed)
Pre visit review using our clinic review tool, if applicable. No additional management support is needed unless otherwise documented below in the visit note. 

## 2016-02-09 NOTE — Assessment & Plan Note (Signed)
She has joint pain and swelling and some tightness and possible osteoarthritis, but has a distant family history of rheumatoid arthritis-we will check autoimmune blood work

## 2016-02-10 NOTE — Telephone Encounter (Signed)
Called back to mobile number.  No answer; voicemail not set up yet.

## 2016-02-11 NOTE — Telephone Encounter (Signed)
Patient is planning to have labs done soon (including lipids) ordered by PCP. Has appointment with PR in May.

## 2016-02-14 ENCOUNTER — Other Ambulatory Visit (INDEPENDENT_AMBULATORY_CARE_PROVIDER_SITE_OTHER): Payer: BLUE CROSS/BLUE SHIELD

## 2016-02-14 DIAGNOSIS — Z0000 Encounter for general adult medical examination without abnormal findings: Secondary | ICD-10-CM

## 2016-02-14 DIAGNOSIS — M2550 Pain in unspecified joint: Secondary | ICD-10-CM

## 2016-02-14 LAB — RHEUMATOID FACTOR: Rhuematoid fact SerPl-aCnc: <10 IU/mL (ref <=14)

## 2016-02-14 LAB — CBC WITH DIFFERENTIAL/PLATELET
Basophils Absolute: 0 10*3/uL (ref 0.0–0.1)
Basophils Relative: 0.4 % (ref 0.0–3.0)
EOS PCT: 3 % (ref 0.0–5.0)
Eosinophils Absolute: 0.1 10*3/uL (ref 0.0–0.7)
HEMATOCRIT: 41 % (ref 36.0–46.0)
HEMOGLOBIN: 13.9 g/dL (ref 12.0–15.0)
Lymphocytes Relative: 39.8 % (ref 12.0–46.0)
Lymphs Abs: 1.4 10*3/uL (ref 0.7–4.0)
MCHC: 33.8 g/dL (ref 30.0–36.0)
MCV: 89.2 fl (ref 78.0–100.0)
Monocytes Absolute: 0.2 10*3/uL (ref 0.1–1.0)
Monocytes Relative: 6.5 % (ref 3.0–12.0)
Neutro Abs: 1.8 10*3/uL (ref 1.4–7.7)
Neutrophils Relative %: 50.3 % (ref 43.0–77.0)
Platelets: 153 10*3/uL (ref 150.0–400.0)
RBC: 4.59 Mil/uL (ref 3.87–5.11)
RDW: 13.5 % (ref 11.5–15.5)
WBC: 3.5 10*3/uL — AB (ref 4.0–10.5)

## 2016-02-14 LAB — LIPID PANEL
Cholesterol: 181 mg/dL (ref 0–200)
HDL: 66 mg/dL (ref >39.00)
LDL Cholesterol: 99 mg/dL (ref 0–99)
NonHDL: 114.59
TRIGLYCERIDES: 79 mg/dL (ref 0.0–149.0)
Total CHOL/HDL Ratio: 3
VLDL: 15.8 mg/dL (ref 0.0–40.0)

## 2016-02-14 LAB — COMPREHENSIVE METABOLIC PANEL
ALBUMIN: 4.3 g/dL (ref 3.5–5.2)
ALK PHOS: 73 U/L (ref 39–117)
ALT: 20 U/L (ref 0–35)
AST: 17 U/L (ref 0–37)
BUN: 14 mg/dL (ref 6–23)
CO2: 31 mEq/L (ref 19–32)
CREATININE: 0.81 mg/dL (ref 0.40–1.20)
Calcium: 9.8 mg/dL (ref 8.4–10.5)
Chloride: 106 mEq/L (ref 96–112)
GFR: 76.58 mL/min (ref >60.00)
Glucose, Bld: 86 mg/dL (ref 70–99)
POTASSIUM: 4.5 meq/L (ref 3.5–5.1)
SODIUM: 141 meq/L (ref 135–145)
TOTAL PROTEIN: 6.9 g/dL (ref 6.0–8.3)
Total Bilirubin: 0.7 mg/dL (ref 0.2–1.2)

## 2016-02-14 LAB — C-REACTIVE PROTEIN: CRP: 0 mg/dL — ABNORMAL LOW (ref 0.5–20.0)

## 2016-02-14 LAB — SEDIMENTATION RATE: Sed Rate: 12 mm/hr (ref 0–22)

## 2016-02-14 LAB — TSH: TSH: 1.65 u[IU]/mL (ref 0.35–4.50)

## 2016-02-15 LAB — ANA: Anti Nuclear Antibody(ANA): NEGATIVE

## 2016-02-16 ENCOUNTER — Encounter: Payer: Self-pay | Admitting: Internal Medicine

## 2016-02-17 ENCOUNTER — Encounter: Payer: Self-pay | Admitting: Internal Medicine

## 2016-03-04 ENCOUNTER — Other Ambulatory Visit: Payer: Self-pay | Admitting: Internal Medicine

## 2016-03-24 DIAGNOSIS — H2513 Age-related nuclear cataract, bilateral: Secondary | ICD-10-CM | POA: Diagnosis not present

## 2016-03-24 DIAGNOSIS — H04123 Dry eye syndrome of bilateral lacrimal glands: Secondary | ICD-10-CM | POA: Diagnosis not present

## 2016-05-08 ENCOUNTER — Ambulatory Visit (INDEPENDENT_AMBULATORY_CARE_PROVIDER_SITE_OTHER): Payer: BLUE CROSS/BLUE SHIELD | Admitting: Internal Medicine

## 2016-05-08 ENCOUNTER — Telehealth: Payer: Self-pay | Admitting: Internal Medicine

## 2016-05-08 ENCOUNTER — Encounter: Payer: Self-pay | Admitting: Internal Medicine

## 2016-05-08 VITALS — BP 110/80 | HR 63 | Ht 66.0 in | Wt 143.8 lb

## 2016-05-08 DIAGNOSIS — I471 Supraventricular tachycardia: Secondary | ICD-10-CM

## 2016-05-08 DIAGNOSIS — E785 Hyperlipidemia, unspecified: Secondary | ICD-10-CM | POA: Diagnosis not present

## 2016-05-08 MED ORDER — ROSUVASTATIN CALCIUM 5 MG PO TABS: 2.5000 mg | ORAL_TABLET | Freq: Every day | ORAL | Status: DC

## 2016-05-08 MED ORDER — ROSUVASTATIN CALCIUM 5 MG PO TABS: 5.0000 mg | ORAL_TABLET | ORAL | Status: DC

## 2016-05-08 NOTE — Telephone Encounter (Signed)
Spoke with Cytogeneticist. They had question regarding crestor directions.  This has been clarified with them. They will hold medication until patient calls and lets them know she needs it filled.

## 2016-05-08 NOTE — Telephone Encounter (Signed)
New message      Calling to verify directions on crestor presc sent this am

## 2016-05-08 NOTE — Patient Instructions (Signed)
Your physician has recommended you make the following change in your medication:  1.) increase rosuvastatin to 1 tablet once weekly, if tolerating, Dr. Harrington Challenger would like you to increase to twice per week  Your physician recommends that you return for lab work in: October, 2017 to check cholesterol levels  Your physician wants you to follow-up in: 1 year with Dr. Harrington Challenger.  You will receive a reminder letter in the mail two months in advance. If you don't receive a letter, please call our office to schedule the follow-up appointment.

## 2016-05-08 NOTE — Progress Notes (Signed)
Cardiology Office Note   Date:  05/08/2016   ID:  Tiffany Moran, DOB [birth year], MRN [id number]  PCP:  Tiffany Rail, MD  Cardiologist:   Dorris Carnes, MD   F/U of palpitations and CAD     History of Present Illness: Tiffany Moran is a 60 y.o. female with a history of palpitations  Also a history of abnormal myoview  Cardiac CT showed mild nonobstructive CAD   Since seen the pt has stayed active  She is worried about allowing HR to increase too much  Does not want to have recurrence of SVT (had been treated in past with adenosine in ER and vagal manuvers; long time ago) She denies signif palpitations No CP  Breathing is OK       Outpatient Prescriptions Prior to Visit  Medication Sig Dispense Refill  . ALPRAZolam (XANAX) 0.5 MG tablet Take 1 tablet (0.5 mg total) by mouth every 8 (eight) hours as needed for anxiety or sleep (Use every 8-12 hours as needed). 15 tablet 0  . aspirin EC 81 MG tablet Take 1 tablet (81 mg total) by mouth daily. 90 tablet 3  . clindamycin (CLEOCIN T) 1 % lotion Apply 60 g topically as needed. Does not need now 60 mL 1  . ESTRACE VAGINAL 0.1 MG/GM vaginal cream INSERT ONE-FOURTH APPLICATORFUL INTRAVAGINALLY AS NEEDED AS DIRECTED 42.5 g 3  . meclizine (ANTIVERT) 12.5 MG tablet Take 1 tablet (12.5 mg total) by mouth 3 (three) times daily as needed for dizziness. 30 tablet 0  . metoprolol succinate (TOPROL-XL) 25 MG 24 hr tablet Take 1 tablet (25 mg total) by mouth daily. 90 tablet 3  . rosuvastatin (CRESTOR) 5 MG tablet Take 0.5 tablets (2.5 mg total) by mouth daily. 45 tablet 3  . estradiol (ESTRACE) 0.1 MG/GM vaginal cream 1 gm twice weekly, maintain with 1/2 gm twice weekly (Patient not taking: Reported on 05/08/2016) 42.5 g 3  . metoprolol succinate (TOPROL-XL) 25 MG 24 hr tablet TAKE 1 TABLET (25 MG TOTAL) BY MOUTH DAILY. (Patient not taking: Reported on 05/08/2016) 90 tablet 3  . Vitamin D, Ergocalciferol, (DRISDOL) 50000 UNITS CAPS capsule Take 1 capsule  (50,000 Units total) by mouth every 14 (fourteen) days. (Patient not taking: Reported on 05/08/2016) 6 capsule 4   No facility-administered medications prior to visit.     Allergies:   Lipitor   Past Medical History  Diagnosis Date  . Hyperlipidemia   . Shingles 2012    leg  . Mitral valve prolapse   . Cardiospasm 1983    felt may be related to MVP    Past Surgical History  Procedure Laterality Date  . Tubal ligation    . Exploratory laparotomy      X2 for pain: fibroid, enlarged oviduct  . Dilation and curettage of uterus    . Salpingectomy  2007 or 2008    and fibroid removed  . Colonoscopy  2007    negatve, [place] ,[name]     Social History:  The patient  reports that she has never smoked. She has never used smokeless tobacco. She reports that she does not drink alcohol or use illicit drugs.   Family History:  The patient's family history includes Heart attack (age of onset: 37) in her father; Hyperlipidemia in her father and mother; Hypertension in her father and mother; Hypothyroidism in her mother; Leukemia (age of onset: 4) in her mother; Myasthenia gravis in her father; Osteoporosis in her father; Prostate cancer in her  brother; Stroke in her maternal grandfather. There is no history of Diabetes.    ROS:  Please see the history of present illness. All other systems are reviewed and  Negative to the above problem except as noted.    PHYSICAL EXAM: VS:  BP 110/80 mmHg  Pulse 63  Ht 5\' 6"  (1.676 m)  Wt 143 lb 12.8 oz (65.227 kg)  BMI 23.22 kg/m2  LMP 06/17/2009  GEN: Well nourished, well developed, in no acute distress HEENT: normal Neck: no JVD, carotid bruits, or masses Cardiac: RRR; no murmurs, rubs, or gallops,no edema  Respiratory:  clear to auscultation bilaterally, normal work of breathing GI: soft, nontender, nondistended, + BS  No hepatomegaly  MS: no deformity Moving all extremities   Skin: warm and dry, no rash Neuro:  Strength and sensation are  intact Psych: euthymic mood, full affect   EKG:  EKG is ordered today.   Lipid Panel    Component Value Date/Time   CHOL 181 02/14/2016 0810   TRIG 79.0 02/14/2016 0810   HDL 66.00 02/14/2016 0810   CHOLHDL 3 02/14/2016 0810   VLDL 15.8 02/14/2016 0810   LDLCALC 99 02/14/2016 0810      Wt Readings from Last 3 Encounters:  05/08/16 143 lb 12.8 oz (65.227 kg)  02/09/16 146 lb (66.225 kg)  11/25/15 147 lb 4 oz (66.792 kg)      ASSESSMENT AND PLAN:  1  CAD  Very mild on CT  I am not convinced of angina Modify risk factors  2.  HL  Would like to get tighter control of lipids  LDL was 99  I would recomm increasing crestor to 1 whole tab (5 mg )  One then two times per week  Check lipids in the fall  3  Hx SVT  No recurrence  I told her she could increase activity  Walk on treadmill (interval training)       Signed, Dorris Carnes, MD  05/08/2016 9:50 AM    [address], [place], [place]  [address] Phone: [phone]; Fax: [phone]

## 2016-05-11 DIAGNOSIS — H43391 Other vitreous opacities, right eye: Secondary | ICD-10-CM | POA: Diagnosis not present

## 2016-05-11 DIAGNOSIS — H2513 Age-related nuclear cataract, bilateral: Secondary | ICD-10-CM | POA: Diagnosis not present

## 2016-05-17 ENCOUNTER — Telehealth: Payer: Self-pay | Admitting: Internal Medicine

## 2016-05-17 DIAGNOSIS — M7122 Synovial cyst of popliteal space [Baker], left knee: Secondary | ICD-10-CM

## 2016-05-17 DIAGNOSIS — I82402 Acute embolism and thrombosis of unspecified deep veins of left lower extremity: Secondary | ICD-10-CM

## 2016-05-17 NOTE — Telephone Encounter (Signed)
Patient stated when she was in the office Dr. Harrington Challenger looked at her left leg which at the time had small amount of swelling.  Two days ago it got worse---it feels tight, like a rubber band around it.  Also new is a sharp shooting/cramping pain. She stated that she has negative homan's sign.  It is not discolored and temperature is normal.  She stated Dr. Harrington Challenger checked her pedal pulse in the office and stated it was strong.  She has a varicose vein on her left inner calf.    She did not do anything different that was strenuous. Attends regular exercise class.  Pt is aware I am forwarding to Dr.Ross to inform and will call her back with any new recommendations.

## 2016-05-17 NOTE — Telephone Encounter (Signed)
New Message:  Pt called in complaining of cramps in her left leg and she would like to speak with a nurse about this. Please f/u with her

## 2016-05-18 NOTE — Telephone Encounter (Signed)
Check USN of L leg to eval for DVT or Bakers cyst.

## 2016-05-19 NOTE — Telephone Encounter (Signed)
Spoke with the pt and informed her of Dr Alan Ripper recommendations for her to have a LE Venous Duplex of the left leg, to eval for DVT or Bakers Cyst, with complaints of sharp shooting and cramping pain in that extremity.  Informed the pt that I will place this order in the system, and have one of our [facility] schedulers call her back to have this appt arranged.  Pt verbalized understanding and agrees with this plan.

## 2016-05-22 ENCOUNTER — Ambulatory Visit (HOSPITAL_COMMUNITY)
Admission: RE | Admit: 2016-05-22 | Discharge: 2016-05-22 | Disposition: A | Discharge status: Home / Self Care | Payer: BLUE CROSS/BLUE SHIELD | Source: Ambulatory Visit | Attending: Cardiovascular Disease | Admitting: Cardiovascular Disease

## 2016-05-22 ENCOUNTER — Other Ambulatory Visit: Payer: Self-pay | Admitting: Internal Medicine

## 2016-05-22 DIAGNOSIS — M79662 Pain in left lower leg: Secondary | ICD-10-CM | POA: Insufficient documentation

## 2016-05-22 DIAGNOSIS — M7989 Other specified soft tissue disorders: Secondary | ICD-10-CM | POA: Diagnosis not present

## 2016-05-22 DIAGNOSIS — E785 Hyperlipidemia, unspecified: Secondary | ICD-10-CM | POA: Insufficient documentation

## 2016-05-29 ENCOUNTER — Encounter: Payer: Self-pay | Admitting: Nurse Practitioner

## 2016-05-29 ENCOUNTER — Ambulatory Visit (INDEPENDENT_AMBULATORY_CARE_PROVIDER_SITE_OTHER): Payer: BLUE CROSS/BLUE SHIELD | Admitting: Nurse Practitioner

## 2016-05-29 VITALS — BP 126/70 | HR 64 | Resp 16 | Ht 65.5 in | Wt 143.0 lb

## 2016-05-29 DIAGNOSIS — Z1159 Encounter for screening for other viral diseases: Secondary | ICD-10-CM | POA: Diagnosis not present

## 2016-05-29 DIAGNOSIS — E559 Vitamin D deficiency, unspecified: Secondary | ICD-10-CM | POA: Diagnosis not present

## 2016-05-29 DIAGNOSIS — R87610 Atypical squamous cells of undetermined significance on cytologic smear of cervix (ASC-US): Secondary | ICD-10-CM | POA: Diagnosis not present

## 2016-05-29 DIAGNOSIS — Z0000 Encounter for general adult medical examination without abnormal findings: Secondary | ICD-10-CM | POA: Diagnosis not present

## 2016-05-29 DIAGNOSIS — Z01419 Encounter for gynecological examination (general) (routine) without abnormal findings: Secondary | ICD-10-CM

## 2016-05-29 LAB — POCT URINALYSIS DIPSTICK
BILIRUBIN UA: NEGATIVE
Blood, UA: NEGATIVE
GLUCOSE UA: NEGATIVE
Ketones, UA: NEGATIVE
LEUKOCYTES UA: NEGATIVE
NITRITE UA: NEGATIVE
PH UA: 7
Protein, UA: NEGATIVE
UROBILINOGEN UA: NEGATIVE

## 2016-05-29 MED ORDER — ESTRADIOL 0.1 MG/GM VA CREA
TOPICAL_CREAM | VAGINAL | Status: DC
Start: 2016-05-29 — End: 2018-04-17

## 2016-05-29 NOTE — Progress Notes (Signed)
60 y.o. G2P2 Married  Caucasian Fe here for annual exam.  No new health problems.  She remains active. She is using Estrace cream rarely but having more dyspareunia.  Patient's last menstrual period was 06/17/2009.          Sexually active: Yes.    The current method of family planning is tubal ligation.    Exercising: Yes.    Walking, cardio Smoker:  no  Health Maintenance: Pap:  05/24/15 Neg MMG:  06/11/15 BIRADS1:neg Colonoscopy:   12/10/15 normal except tortuous colon  BMD:  06/02/14 Osteopenia  TDaP:  03/18/2012  Shingles: No Pneumonia: No Hep C and HIV: done today Labs: PCP UA: negative   reports that she has never smoked. She has never used smokeless tobacco. She reports that she does not drink alcohol or use illicit drugs.  Past Medical History  Diagnosis Date  . Hyperlipidemia   . Shingles 2012    leg  . Mitral valve prolapse   . Cardiospasm 1983    felt may be related to MVP    Past Surgical History  Procedure Laterality Date  . Tubal ligation    . Exploratory laparotomy      X2 for pain: fibroid, enlarged oviduct  . Dilation and curettage of uterus    . Salpingectomy  2007 or 2008    and fibroid removed  . Colonoscopy  2007    [place], [place] ,[name]  . Retinal laser procedure Right 08/2015    Current Outpatient Prescriptions  Medication Sig Dispense Refill  . ALPRAZolam (XANAX) 0.5 MG tablet Take 1 tablet (0.5 mg total) by mouth every 8 (eight) hours as needed for anxiety or sleep (Use every 8-12 hours as needed). 15 tablet 0  . aspirin EC 81 MG tablet Take 1 tablet (81 mg total) by mouth daily. 90 tablet 3  . Cholecalciferol (VITAMIN D) 2000 units CAPS Take 2,000 Units by mouth daily.    . clindamycin (CLEOCIN T) 1 % lotion Apply 60 g topically as needed. Does not need now 60 mL 1  . ESTRACE VAGINAL 0.1 MG/GM vaginal cream INSERT ONE-FOURTH APPLICATORFUL INTRAVAGINALLY AS NEEDED AS DIRECTED 42.5 g 3  . meclizine (ANTIVERT) 12.5 MG tablet Take 1 tablet (12.5 mg  total) by mouth 3 (three) times daily as needed for dizziness. 30 tablet 0  . metoprolol succinate (TOPROL-XL) 25 MG 24 hr tablet Take 1 tablet (25 mg total) by mouth daily. 90 tablet 3  . rosuvastatin (CRESTOR) 5 MG tablet Take 0.5 tablets (2.5 mg total) by mouth daily. Except Wednesdays, take a whole tablet. 55 tablet 3   No current facility-administered medications for this visit.    Family History  Problem Relation Age of Onset  . Heart attack Father 66  . Myasthenia gravis Father   . Hypertension Father   . Hyperlipidemia Father   . Osteoporosis Father     due to steroids for Myasthenia Grvis  . Leukemia Mother 29  . Hyperlipidemia Mother   . Hypertension Mother   . Hypothyroidism Mother   . Prostate cancer Brother     in 48s  . Stroke Maternal Grandfather     in 9s  . Diabetes Neg Hx     ROS:  Pertinent items are noted in HPI.  Otherwise, a comprehensive ROS was negative.  Exam:   BP 126/70 mmHg  Pulse 64  Resp 16  Ht 5' 5.5" (1.664 m)  Wt 143 lb (64.864 kg)  BMI 23.43 kg/m2  LMP 06/17/2009  Height: 5' 5.5" (166.4 cm) Ht Readings from Last 3 Encounters:  05/29/16 5' 5.5" (1.664 m)  05/08/16 5\' 6"  (1.676 m)  11/25/15 5\' 6"  (1.676 m)    General appearance: alert, cooperative and appears stated age Head: Normocephalic, without obvious abnormality, atraumatic Neck: no adenopathy, supple, symmetrical, trachea midline and thyroid normal to inspection and palpation Lungs: clear to auscultation bilaterally Breasts: normal appearance, no masses or tenderness Heart: regular rate and rhythm Abdomen: soft, non-tender; no masses,  no organomegaly Extremities: extremities normal, atraumatic, no cyanosis or edema Skin: Skin color, texture, turgor normal. No rashes or lesions Lymph nodes: Cervical, supraclavicular, and axillary nodes normal. No abnormal inguinal nodes palpated Neurologic: Grossly normal   Pelvic: External genitalia:  no lesions              Urethra:   normal appearing urethra with no masses, tenderness or lesions              Bartholin's and Skene's: normal                 Vagina: normal appearing vagina with normal color and discharge, no lesions              Cervix: anteverted              Pap taken: Yes.   Bimanual Exam:  Uterus:  normal size, contour, position, consistency, mobility, non-tender              Adnexa: no mass, fullness, tenderness               Rectovaginal: Confirms               Anus:  normal sphincter tone, no lesions  Chaperone present: yes  A:  Well Woman with normal exam  Postmenopausal Atrophic vaginitis using Estrace only prn History of MVP   P:   Reviewed health and wellness pertinent to exam  Pap smear as above  Mammogram is due 05/2016  Refill on Estrace vaginal cream to use prn as desired  Counseled on breast self exam, adequate intake of calcium and vitamin D, diet and exercise return annually or prn  An After Visit Summary was printed and given to the patient.

## 2016-05-29 NOTE — Progress Notes (Signed)
Encounter reviewed Sylvain Hasten, MD   

## 2016-05-29 NOTE — Patient Instructions (Signed)

## 2016-05-30 LAB — HEPATITIS C ANTIBODY: HCV AB: NEGATIVE

## 2016-05-30 LAB — HIV ANTIBODY (ROUTINE TESTING W REFLEX): HIV 1&2 Ab, 4th Generation: NONREACTIVE

## 2016-05-30 LAB — VITAMIN D 25 HYDROXY (VIT D DEFICIENCY, FRACTURES): Vit D, 25-Hydroxy: 43 ng/mL (ref 30–100)

## 2016-05-31 LAB — IPS PAP TEST WITH HPV

## 2016-06-14 DIAGNOSIS — Z1231 Encounter for screening mammogram for malignant neoplasm of breast: Secondary | ICD-10-CM | POA: Diagnosis not present

## 2016-06-26 ENCOUNTER — Encounter: Payer: Self-pay | Admitting: Nurse Practitioner

## 2016-07-20 DIAGNOSIS — M79672 Pain in left foot: Secondary | ICD-10-CM | POA: Diagnosis not present

## 2016-08-01 DIAGNOSIS — H43811 Vitreous degeneration, right eye: Secondary | ICD-10-CM | POA: Diagnosis not present

## 2016-08-01 DIAGNOSIS — H43391 Other vitreous opacities, right eye: Secondary | ICD-10-CM | POA: Diagnosis not present

## 2016-08-01 DIAGNOSIS — H33321 Round hole, right eye: Secondary | ICD-10-CM | POA: Diagnosis not present

## 2016-08-01 DIAGNOSIS — H53121 Transient visual loss, right eye: Secondary | ICD-10-CM | POA: Diagnosis not present

## 2016-08-11 DIAGNOSIS — G5792 Unspecified mononeuropathy of left lower limb: Secondary | ICD-10-CM | POA: Diagnosis not present

## 2016-08-11 DIAGNOSIS — M79672 Pain in left foot: Secondary | ICD-10-CM | POA: Diagnosis not present

## 2016-08-17 ENCOUNTER — Encounter: Payer: Self-pay | Admitting: Obstetrics and Gynecology

## 2016-08-17 ENCOUNTER — Ambulatory Visit (INDEPENDENT_AMBULATORY_CARE_PROVIDER_SITE_OTHER): Payer: BLUE CROSS/BLUE SHIELD | Admitting: Obstetrics and Gynecology

## 2016-08-17 ENCOUNTER — Telehealth: Payer: Self-pay | Admitting: Nurse Practitioner

## 2016-08-17 VITALS — BP 130/82 | HR 64 | Resp 12 | Wt 141.0 lb

## 2016-08-17 DIAGNOSIS — N930 Postcoital and contact bleeding: Secondary | ICD-10-CM | POA: Diagnosis not present

## 2016-08-17 DIAGNOSIS — N950 Postmenopausal bleeding: Secondary | ICD-10-CM

## 2016-08-17 LAB — POCT URINALYSIS DIPSTICK
BILIRUBIN UA: NEGATIVE
Glucose, UA: NEGATIVE
KETONES UA: NEGATIVE
Leukocytes, UA: NEGATIVE
Nitrite, UA: NEGATIVE
PH UA: 6.5
Protein, UA: NEGATIVE
RBC UA: NEGATIVE
Urobilinogen, UA: NEGATIVE

## 2016-08-17 NOTE — Telephone Encounter (Signed)
Patient is menopausal and having bleeding with intercourse.

## 2016-08-17 NOTE — Telephone Encounter (Signed)
Spoke with patient. Patient states that after having intercourse she noticed she had dried blood in her underwear. Denies any further bleeding. Reports she had slight pain wit intercourse, but no pain after or currently. Advised she will need to be seen in the office for further evaluation. She is agreeable and verbalizes understanding. Appointment scheduled for today 08/17/2016 at 10 am with Dr.Jertson. She is agreeable to date, time, and to see another provider as Kem Boroughs, FNP is out of the office today.  Routing to covering provider for final review. Patient agreeable to disposition. Will close encounter.

## 2016-08-17 NOTE — Progress Notes (Signed)
GYNECOLOGY  VISIT   HPI: 60 y.o.   Married  Caucasian  female   G2P2 with Patient's last menstrual period was 06/17/2009.   here c/o bleeding and discomfort during intercourse this am. The patient is postmenopausal with a h/o vaginal atrophy. She has estrace vaginal cream which she forgets to use.  She had intercourse this morning, no entry pain, hurt deep inside, one sharp pain. Has intercourse every couple of weeks, uses astroglide.    Pap from 6/17 was ASCUS, hpv negative.  No abdominal pain or urinary c/o.   GYNECOLOGIC HISTORY: Patient's last menstrual period was 06/17/2009. Contraception:postmenopause Menopausal hormone therapy: none         OB History    Gravida Para Term Preterm AB Living   2 2       2    SAB TAB Ectopic Multiple Live Births           2         Patient Active Problem List   Diagnosis Date Noted  . 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 Pes anserine bursitis 04/21/2014  . 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 Osteopenia 08/04/2011  . Unspecified vitamin D deficiency 08/04/2011  . History of mitral valve prolapse 07/26/2010  . 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 RAYNAUD'S SYNDROME 01/05/2010  . Hyperlipidemia 08/17/2009  . POLYARTHRITIS 08/17/2009    Past Medical History:  Diagnosis Date  . Cardiospasm 1983   felt may be related to MVP  . Hyperlipidemia   . Mitral valve prolapse   . Shingles 2012   leg    Past Surgical History:  Procedure Laterality Date  . COLONOSCOPY  2007   [place], [place] ,[name]  . DILATION AND CURETTAGE OF UTERUS    . EXPLORATORY LAPAROTOMY     X2 for pain: fibroid, enlarged oviduct  . RETINAL LASER PROCEDURE Right 08/2015  . SALPINGECTOMY  2007 or 2008   and fibroid removed  . TUBAL LIGATION      Current Outpatient Prescriptions  Medication Sig Dispense Refill  . ALPRAZolam (XANAX) 0.5 MG tablet Take 1 tablet (0.5 mg total) by mouth every 8 (eight) hours as needed for anxiety or sleep (Use  every 8-12 hours as needed). 15 tablet 0  . aspirin EC 81 MG tablet Take 1 tablet (81 mg total) by mouth daily. 90 tablet 3  . Cholecalciferol (VITAMIN D) 2000 units CAPS Take 2,000 Units by mouth daily.    . clindamycin (CLEOCIN T) 1 % lotion Apply 60 g topically as needed. Does not need now 60 mL 1  . Cyanocobalamin (VITAMIN B 12 PO) Take by mouth.    . estradiol (ESTRACE VAGINAL) 0.1 MG/GM vaginal cream Use 1/2 gm intravaginally twice a week. 42.5 g 3  . meclizine (ANTIVERT) 12.5 MG tablet Take 1 tablet (12.5 mg total) by mouth 3 (three) times daily as needed for dizziness. 30 tablet 0  . metoprolol succinate (TOPROL-XL) 25 MG 24 hr tablet Take 1 tablet (25 mg total) by mouth daily. 90 tablet 3  . rosuvastatin (CRESTOR) 5 MG tablet Take 0.5 tablets (2.5 mg total) by mouth daily. Except Wednesdays, take a whole tablet. 55 tablet 3   No current facility-administered medications for this visit.      ALLERGIES: Lipitor [atorvastatin]  Family History  Problem Relation Age of Onset  . Heart attack Father 24  . Myasthenia gravis Father   . Hypertension Father   . Hyperlipidemia Father   . Osteoporosis Father  due to steroids for Myasthenia Grvis  . Leukemia Mother 65  . Hyperlipidemia Mother   . Hypertension Mother   . Hypothyroidism Mother   . Prostate cancer Brother     in 45s  . Stroke Maternal Grandfather     in 72s  . Diabetes Neg Hx     Social History   Social History  . Marital status: Married    Spouse name: N/A  . Number of children: N/A  . Years of education: N/A   Occupational History  . Not on file.   Social History Main Topics  . Smoking status: Never Smoker  . Smokeless tobacco: Never Used  . Alcohol use No  . Drug use: No  . Sexual activity: Yes    Birth control/ protection: Other-see comments, Post-menopausal     Comment: BTL   Other Topics Concern  . Not on file   Social History Narrative   Exercises regularly    Review of Systems   Constitutional: Negative.   HENT: Negative.   Eyes: Negative.   Respiratory: Negative.   Cardiovascular: Negative.   Gastrointestinal: Negative.   Genitourinary:       Bleeding and pain with intercourse   Musculoskeletal: Negative.   Skin: Negative.   Neurological: Negative.   Endo/Heme/Allergies: Negative.   Psychiatric/Behavioral: Negative.     PHYSICAL EXAMINATION:    BP 130/82 (BP Location: Right Arm, Patient Position: Sitting, Cuff Size: Normal)   Pulse 64   Resp 12   Wt 141 lb (64 kg)   LMP 06/17/2009   BMI 23.11 kg/m     General appearance: alert, cooperative and appears stated age Abdomen: soft, non-tender; bowel sounds normal; no masses,  no organomegaly  Pelvic: External genitalia:  no lesions, mild atrophy              Urethra:  ?early caruncle starting, friable appearing               Bartholins and Skenes: normal                 Vagina: normal appearing atrophic vagina with normal color and discharge, no lesions, no lacerations              Cervix: nabothian cyst at the opening of the cervix, small amount of blood noted in the cervix              Bimanual Exam:  Uterus:  normal size, contour, position, consistency, mobility, non-tender              Adnexa: no mass, fullness, tenderness                Chaperone was present for exam.  ASSESSMENT Postmenopausal, postcoital bleeding, definitely coming from her uterus ?early urethral caruncle, urine dip negative for blood    PLAN Return for an ultrasound, possible sonohysterogram, possible endometrial biopsy Will treat her urethra with a pea sized amount of estrace cream daily until her f/u appointment   An After Visit Summary was printed and given to the patient.

## 2016-08-18 ENCOUNTER — Telehealth: Payer: Self-pay | Admitting: Nurse Practitioner

## 2016-08-18 NOTE — Telephone Encounter (Signed)
Spoke with patient. Patient was seen in the office on 08/17/2016 with Dr.Jertson for PMB. Patient reports she is still having light spotting and would like to proceed with scheduling her ultrasound with Dr.Jertson. Denies any heavy bleeding or pain. PUS and possible SHGM and EMB scheduled for 08/22/2016 at 1:30 pm with 2:15 pm consult with Dr.Jertson (date and time per Lamont Snowball, RN). Patient is agreeable to date and time. Order has been placed by Dr.Jertson for precert. Pre procedure instructions given.  Motrin instructions given. Motrin=Advil=Ibuprofen, 800 mg one hour before appointment. Eat a meal and hydrate well before appointment. Aware if she develops heavy bleeding or any new symptoms she will need to be seen for further evaluation over the weekend in the ER. Patient is agreeable and verbalizes understanding.  Cc: Lerry Liner for precert  Routing to provider for final review. Patient agreeable to disposition. Will close encounter.

## 2016-08-18 NOTE — Telephone Encounter (Signed)
Patient is still spotting and is anxious to schedule her ultrasound/shgm.

## 2016-08-22 ENCOUNTER — Ambulatory Visit (INDEPENDENT_AMBULATORY_CARE_PROVIDER_SITE_OTHER): Payer: BLUE CROSS/BLUE SHIELD

## 2016-08-22 ENCOUNTER — Ambulatory Visit (INDEPENDENT_AMBULATORY_CARE_PROVIDER_SITE_OTHER): Payer: BLUE CROSS/BLUE SHIELD | Admitting: Obstetrics and Gynecology

## 2016-08-22 ENCOUNTER — Other Ambulatory Visit: Payer: Self-pay | Admitting: Obstetrics and Gynecology

## 2016-08-22 VITALS — BP 110/70 | HR 64 | Resp 14 | Wt 141.0 lb

## 2016-08-22 DIAGNOSIS — N930 Postcoital and contact bleeding: Secondary | ICD-10-CM

## 2016-08-22 DIAGNOSIS — N840 Polyp of corpus uteri: Secondary | ICD-10-CM

## 2016-08-22 DIAGNOSIS — N9089 Other specified noninflammatory disorders of vulva and perineum: Secondary | ICD-10-CM

## 2016-08-22 DIAGNOSIS — N950 Postmenopausal bleeding: Secondary | ICD-10-CM

## 2016-08-22 MED ORDER — MISOPROSTOL 200 MCG PO TABS: ORAL_TABLET | ORAL | 0 refills | Status: DC

## 2016-08-22 NOTE — Progress Notes (Signed)
GYNECOLOGY  VISIT   HPI: 60 y.o.   Married  Caucasian  female   G2P2 with Patient's last menstrual period was 06/17/2009.   here for further evaluation of postmenopausal bleeding. Pap from 6/17 returned as ASCUS, negative HPV. She has a skin tag on her left vulva that bothers her.     GYNECOLOGIC HISTORY: Patient's last menstrual period was 06/17/2009. Contraception:NA Menopausal hormone therapy: none        OB History    Gravida Para Term Preterm AB Living   2 2       2    SAB TAB Ectopic Multiple Live Births           2         Patient Active Problem List   Diagnosis Date Noted  . 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 Pes anserine bursitis 04/21/2014  . 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 Osteopenia 08/04/2011  . Unspecified vitamin D deficiency 08/04/2011  . History of mitral valve prolapse 07/26/2010  . 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 RAYNAUD'S SYNDROME 01/05/2010  . Hyperlipidemia 08/17/2009  . POLYARTHRITIS 08/17/2009    Past Medical History:  Diagnosis Date  . Cardiospasm 1983   felt may be related to MVP  . Hyperlipidemia   . Mitral valve prolapse   . Shingles 2012   leg  H/O mild CAD, H/O SVT (no recurrence)  Past Surgical History:  Procedure Laterality Date  . COLONOSCOPY  2007   [place], [place] ,[name]  . DILATION AND CURETTAGE OF UTERUS    . EXPLORATORY LAPAROTOMY     X2 for pain: fibroid, enlarged oviduct  . RETINAL LASER PROCEDURE Right 08/2015  . SALPINGECTOMY  2007 or 2008   and fibroid removed  . TUBAL LIGATION    Nerve pain in her left foot  Current Outpatient Prescriptions  Medication Sig Dispense Refill  . ALPRAZolam (XANAX) 0.5 MG tablet Take 1 tablet (0.5 mg total) by mouth every 8 (eight) hours as needed for anxiety or sleep (Use every 8-12 hours as needed). 15 tablet 0  . aspirin EC 81 MG tablet Take 1 tablet (81 mg total) by mouth daily. 90 tablet 3  . Cholecalciferol (VITAMIN D) 2000 units CAPS Take 2,000  Units by mouth daily.    . clindamycin (CLEOCIN T) 1 % lotion Apply 60 g topically as needed. Does not need now 60 mL 1  . Cyanocobalamin (VITAMIN B 12 PO) Take by mouth.    . estradiol (ESTRACE VAGINAL) 0.1 MG/GM vaginal cream Use 1/2 gm intravaginally twice a week. 42.5 g 3  . meclizine (ANTIVERT) 12.5 MG tablet Take 1 tablet (12.5 mg total) by mouth 3 (three) times daily as needed for dizziness. 30 tablet 0  . metoprolol succinate (TOPROL-XL) 25 MG 24 hr tablet Take 1 tablet (25 mg total) by mouth daily. 90 tablet 3  . rosuvastatin (CRESTOR) 5 MG tablet Take 0.5 tablets (2.5 mg total) by mouth daily. Except Wednesdays, take a whole tablet. 55 tablet 3   No current facility-administered medications for this visit.    Xanax use is rare (with flying), cleocin cream as needed  ALLERGIES: Lipitor [atorvastatin], "space out"  Family History  Problem Relation Age of Onset  . Heart attack Father 80  . Myasthenia gravis Father   . Hypertension Father   . Hyperlipidemia Father   . Osteoporosis Father     due to steroids for Myasthenia Grvis  . Leukemia Mother 19  . Hyperlipidemia Mother   .  Hypertension Mother   . Hypothyroidism Mother   . Prostate cancer Brother     in 62s  . Stroke Maternal Grandfather     in 52s  . Diabetes Neg Hx     Social History   Social History  . Marital status: Married    Spouse name: N/A  . Number of children: N/A  . Years of education: N/A   Occupational History  . Not on file.   Social History Main Topics  . Smoking status: Never Smoker  . Smokeless tobacco: Never Used  . Alcohol use No  . Drug use: No  . Sexual activity: Yes    Birth control/ protection: Other-see comments, Post-menopausal     Comment: BTL   Other Topics Concern  . Not on file   Social History Narrative   Exercises regularly   Review of Systems  Constitutional: Negative.   HENT: Negative.   Eyes: Negative.   Respiratory: Negative.   Cardiovascular: Negative.    Gastrointestinal: Negative.   Genitourinary: Negative.   Musculoskeletal: Negative.   Skin: Negative.   Neurological: Negative.   Endo/Heme/Allergies: Negative.   Psychiatric/Behavioral: Negative.    ROS    PHYSICAL EXAMINATION:    LMP 06/17/2009     General appearance: alert, cooperative and appears stated age Neck: no adenopathy, supple, symmetrical, trachea midline and thyroid normal to inspection and palpation Heart: regular rate and rhythm Lungs: CTAB Abdomen: soft, non-tender; bowel sounds normal; no masses,  no organomegaly Lungs: CTAB Extremities: normal, atraumatic, no cyanosis Skin: normal color, texture and turgor, no rashes or lesions Lymph: normal cervical supraclavicular and inguinal nodes Neurologic: grossly normal   Pelvic: External genitalia:  Small skin tag on the left labia majora (bothersome to patient)              Urethra:  normal appearing urethra with slight erythema at the opening, ? Early caruncle, slightly improved                Chaperone was present for exam.  Ultrasound images reviewed with the patient  ASSESSMENT Postmenopausal bleeding, uterine cavity filled with blood/clot, polyp seen H/O mild CAD and h/o SVT, saw her cardiologist in 5/17     PLAN Hysteroscopy, polypectomy, dilation and curettage, removal of skin tag Reviewed risks, including: bleeding, infection, uterine perforation, need for further sugery Will pre-treat with cytotec  An After Visit Summary was printed and given to the patient.  25 minutes face to face time of which over 50% was spent in counseling.    Needs cardiac clearance   CC: Dr Dorris Carnes, Dr Billey Gosling

## 2016-08-22 NOTE — Progress Notes (Signed)
Please see attached note by Dr Talbert Nan  Review of Systems  Constitutional: Negative.   HENT: Negative.   Eyes: Negative.   Respiratory: Negative.   Cardiovascular: Negative.   Gastrointestinal: Negative.   Genitourinary: Negative.   Musculoskeletal: Negative.   Skin: Negative.   Neurological: Negative.   Endo/Heme/Allergies: Negative.   Psychiatric/Behavioral: Negative.

## 2016-08-23 ENCOUNTER — Encounter: Payer: Self-pay | Admitting: Obstetrics and Gynecology

## 2016-08-23 NOTE — Progress Notes (Signed)
Pt is OK to proceed with procedure  Low risk for major cardiac event

## 2016-08-25 ENCOUNTER — Telehealth: Payer: Self-pay | Admitting: *Deleted

## 2016-08-25 ENCOUNTER — Telehealth: Payer: Self-pay | Admitting: Internal Medicine

## 2016-08-25 NOTE — Telephone Encounter (Signed)
Call to patient, left message to call back. Left message scheduled for Tuesday as discussed. Call back to conform details.

## 2016-08-25 NOTE — Telephone Encounter (Signed)
Call to Dr Harrington Challenger office. Left message with Sunday Spillers requesting cardiac clearance for Tuesday 08-29-16 surgery date. Message will be sent to nurse and someone will call back.

## 2016-08-25 NOTE — H&P (Signed)
GYNECOLOGY  VISIT   HPI: 60 y.o.   Married  Caucasian  female   G2P2 with Patient's last menstrual period was 06/17/2009.   here for further evaluation of postmenopausal bleeding. Pap from 6/17 returned as ASCUS, negative HPV. She has a skin tag on her left vulva that bothers her.     GYNECOLOGIC HISTORY: Patient's last menstrual period was 06/17/2009. Contraception:NA Menopausal hormone therapy: none                OB History    Gravida Para Term Preterm AB Living   2 2       2    SAB TAB Ectopic Multiple Live Births           2             Patient Active Problem List   Diagnosis Date Noted  . 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 Pes anserine bursitis 04/21/2014  . 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 Osteopenia 08/04/2011  . Unspecified vitamin D deficiency 08/04/2011  . History of mitral valve prolapse 07/26/2010  . 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 RAYNAUD'S SYNDROME 01/05/2010  . Hyperlipidemia 08/17/2009  . POLYARTHRITIS 08/17/2009        Past Medical History:  Diagnosis Date  . Cardiospasm 1983   felt may be related to MVP  . Hyperlipidemia   . Mitral valve prolapse   . Shingles 2012   leg  H/O mild CAD, H/O SVT (no recurrence)       Past Surgical History:  Procedure Laterality Date  . COLONOSCOPY  2007   [place], [place] ,[name]  . DILATION AND CURETTAGE OF UTERUS    . EXPLORATORY LAPAROTOMY     X2 for pain: fibroid, enlarged oviduct  . RETINAL LASER PROCEDURE Right 08/2015  . SALPINGECTOMY  2007 or 2008   and fibroid removed  . TUBAL LIGATION    Nerve pain in her left foot        Current Outpatient Prescriptions  Medication Sig Dispense Refill  . ALPRAZolam (XANAX) 0.5 MG tablet Take 1 tablet (0.5 mg total) by mouth every 8 (eight) hours as needed for anxiety or sleep (Use every 8-12 hours as needed). 15 tablet 0  . aspirin EC 81 MG tablet Take 1 tablet (81 mg total) by mouth daily. 90 tablet 3   . Cholecalciferol (VITAMIN D) 2000 units CAPS Take 2,000 Units by mouth daily.    . clindamycin (CLEOCIN T) 1 % lotion Apply 60 g topically as needed. Does not need now 60 mL 1  . Cyanocobalamin (VITAMIN B 12 PO) Take by mouth.    . estradiol (ESTRACE VAGINAL) 0.1 MG/GM vaginal cream Use 1/2 gm intravaginally twice a week. 42.5 g 3  . meclizine (ANTIVERT) 12.5 MG tablet Take 1 tablet (12.5 mg total) by mouth 3 (three) times daily as needed for dizziness. 30 tablet 0  . metoprolol succinate (TOPROL-XL) 25 MG 24 hr tablet Take 1 tablet (25 mg total) by mouth daily. 90 tablet 3  . rosuvastatin (CRESTOR) 5 MG tablet Take 0.5 tablets (2.5 mg total) by mouth daily. Except Wednesdays, take a whole tablet. 55 tablet 3   No current facility-administered medications for this visit.    Xanax use is rare (with flying), cleocin cream as needed  ALLERGIES: Lipitor [atorvastatin], "space out"        Family History  Problem Relation Age of Onset  . Heart attack Father 10  . Myasthenia gravis Father   .  Hypertension Father   . Hyperlipidemia Father   . Osteoporosis Father     due to steroids for Myasthenia Grvis  . Leukemia Mother 98  . Hyperlipidemia Mother   . Hypertension Mother   . Hypothyroidism Mother   . Prostate cancer Brother     in 26s  . Stroke Maternal Grandfather     in 38s  . Diabetes Neg Hx     Social History        Social History  . Marital status: Married    Spouse name: N/A  . Number of children: N/A  . Years of education: N/A      Occupational History  . Not on file.         Social History Main Topics  . Smoking status: Never Smoker  . Smokeless tobacco: Never Used  . Alcohol use No  . Drug use: No  . Sexual activity: Yes    Birth control/ protection: Other-see comments, Post-menopausal     Comment: BTL       Other Topics Concern  . Not on file      Social History Narrative   Exercises regularly   Review of  Systems  Constitutional: Negative.   HENT: Negative.   Eyes: Negative.   Respiratory: Negative.   Cardiovascular: Negative.   Gastrointestinal: Negative.   Genitourinary: Negative.   Musculoskeletal: Negative.   Skin: Negative.   Neurological: Negative.   Endo/Heme/Allergies: Negative.   Psychiatric/Behavioral: Negative.    ROS    PHYSICAL EXAMINATION:    LMP 06/17/2009     General appearance: alert, cooperative and appears stated age Neck: no adenopathy, supple, symmetrical, trachea midline and thyroid normal to inspection and palpation Heart: regular rate and rhythm Lungs: CTAB Abdomen: soft, non-tender; bowel sounds normal; no masses,  no organomegaly Lungs: CTAB Extremities: normal, atraumatic, no cyanosis Skin: normal color, texture and turgor, no rashes or lesions Lymph: normal cervical supraclavicular and inguinal nodes Neurologic: grossly normal   Pelvic: External genitalia:  Small skin tag on the left labia majora (bothersome to patient)              Urethra:  normal appearing urethra with slight erythema at the opening, ? Early caruncle, slightly improved                Chaperone was present for exam.  Ultrasound images reviewed with the patient  ASSESSMENT Postmenopausal bleeding, uterine cavity filled with blood/clot, polyp seen H/O mild CAD and h/o SVT, saw her cardiologist in 5/17     PLAN Hysteroscopy, polypectomy, dilation and curettage, removal of skin tag Reviewed risks, including: bleeding, infection, uterine perforation, need for further sugery Will pre-treat with cytotec

## 2016-08-25 NOTE — Telephone Encounter (Signed)
Request for surgical clearance:  1. What type of surgery is being performed? Hysteroscopy with D&C   2. When is this surgery scheduled? 08-29-16   3. Are there any medications that need to be held prior to surgery and how long?GeneralCardiac Clearance   4. Name of physician performing surgery? Dr Sumner Boast   5. What is your office phone and fax number? [phone] and fax number is [phone]

## 2016-08-25 NOTE — Telephone Encounter (Signed)
Call to patient to discuss potential surgery dates. Patient prefers to have procedure at earliest available date at [facility].08-29-16 is available but need to obtain cardiac clearance. Will call her back when able to confirm.

## 2016-08-28 ENCOUNTER — Encounter (HOSPITAL_COMMUNITY): Payer: Self-pay

## 2016-08-28 ENCOUNTER — Encounter (HOSPITAL_COMMUNITY)
Admission: RE | Admit: 2016-08-28 | Discharge: 2016-08-28 | Disposition: A | Discharge status: Home / Self Care | Payer: BLUE CROSS/BLUE SHIELD | Source: Ambulatory Visit | Attending: Obstetrics and Gynecology | Admitting: Obstetrics and Gynecology

## 2016-08-28 DIAGNOSIS — I341 Nonrheumatic mitral (valve) prolapse: Secondary | ICD-10-CM | POA: Diagnosis not present

## 2016-08-28 DIAGNOSIS — N950 Postmenopausal bleeding: Secondary | ICD-10-CM | POA: Diagnosis not present

## 2016-08-28 DIAGNOSIS — Z7982 Long term (current) use of aspirin: Secondary | ICD-10-CM | POA: Diagnosis not present

## 2016-08-28 DIAGNOSIS — N9089 Other specified noninflammatory disorders of vulva and perineum: Secondary | ICD-10-CM | POA: Diagnosis not present

## 2016-08-28 HISTORY — DX: Tachycardia, unspecified: R00.0

## 2016-08-28 HISTORY — DX: Other specified postprocedural states: R11.2

## 2016-08-28 HISTORY — DX: Round hole, right eye: H33.321

## 2016-08-28 HISTORY — DX: Raynaud's syndrome without gangrene: I73.00

## 2016-08-28 HISTORY — DX: Cyst of kidney, acquired: N28.1

## 2016-08-28 HISTORY — DX: Other specified postprocedural states: Z98.890

## 2016-08-28 LAB — BASIC METABOLIC PANEL
Anion gap: 7 (ref 5–15)
BUN: 14 mg/dL (ref 6–20)
CHLORIDE: 105 mmol/L (ref 101–111)
CO2: 28 mmol/L (ref 22–32)
Calcium: 9.7 mg/dL (ref 8.9–10.3)
Creatinine, Ser: 0.7 mg/dL (ref 0.44–1.00)
GFR calc Af Amer: >60 mL/min (ref >60)
GFR calc non Af Amer: >60 mL/min (ref >60)
Glucose, Bld: 95 mg/dL (ref 65–99)
POTASSIUM: 4.2 mmol/L (ref 3.5–5.1)
SODIUM: 140 mmol/L (ref 135–145)

## 2016-08-28 LAB — CBC
HCT: 40.6 % (ref 36.0–46.0)
HEMOGLOBIN: 13.8 g/dL (ref 12.0–15.0)
MCH: 30.6 pg (ref 26.0–34.0)
MCHC: 34 g/dL (ref 30.0–36.0)
MCV: 90 fL (ref 78.0–100.0)
Platelets: 178 10*3/uL (ref 150–400)
RBC: 4.51 MIL/uL (ref 3.87–5.11)
RDW: 13.1 % (ref 11.5–15.5)
WBC: 4.9 10*3/uL (ref 4.0–10.5)

## 2016-08-28 NOTE — Telephone Encounter (Signed)
Call to patient. She is at hospital preparing for PAT appointment. Confirmed surgery date for tomorrow.  Advised we have received clearance from Dr Harrington Challenger and this has been sent to hospital.  Surgery instructions reviewed. Patient last took ASA 81 mg on Saturday. She will not take any further doses.  She states she has MVP and usually pretreats with antibiotics. Advised she will be treated in OR tomorrow but will confirm with Dr Talbert Nan.  Patient is aware to use Cytotec vaginally this evening. She has RX.   Routing to Dr Talbert Nan for review

## 2016-08-28 NOTE — Telephone Encounter (Signed)
Endocarditis prophylaxis is not indicated for this procedure. She doesn't need antibiotics for her surgery.

## 2016-08-28 NOTE — Telephone Encounter (Signed)
Patient returning call. Says she is going for her pre-op this morning.

## 2016-08-28 NOTE — Telephone Encounter (Signed)
Return call to patient. Advised Dr Talbert Nan has reviewed call and antibiotics are not indicated for this procedure.   Encounter closed.

## 2016-08-28 NOTE — Patient Instructions (Addendum)
Your procedure is scheduled on:  Tomorrow, Sept. 12, 2017  Enter through the [employer] [facility] at:  9:00 AM  Pick up the phone at the desk and dial [phone].  Call this number if you have problems the morning of surgery: [phone].  Remember: Do NOT eat food or drink after:  Midnight tonight  Take these medicines the morning of surgery with a SIP OF WATER:  None  Do NOT wear jewelry (body piercing), metal hair clips/bobby pins, make-up, or nail polish. Do NOT wear lotions, powders, or perfumes.  You may wear deodorant. Do NOT shave for 48 hours prior to surgery. Do NOT bring valuables to the hospital. Contacts, dentures, or bridgework may not be worn into surgery.  Have a responsible adult drive you home and stay with you for 24 hours after your procedure

## 2016-08-29 ENCOUNTER — Encounter (HOSPITAL_COMMUNITY)
Admission: RE | Disposition: A | Discharge status: Home / Self Care | Payer: Self-pay | Source: Ambulatory Visit | Attending: Obstetrics and Gynecology

## 2016-08-29 ENCOUNTER — Ambulatory Visit (HOSPITAL_COMMUNITY)
Admission: RE | Admit: 2016-08-29 | Discharge: 2016-08-29 | Disposition: A | Discharge status: Home / Self Care | Payer: BLUE CROSS/BLUE SHIELD | Source: Ambulatory Visit | Attending: Obstetrics and Gynecology | Admitting: Obstetrics and Gynecology

## 2016-08-29 ENCOUNTER — Ambulatory Visit (HOSPITAL_COMMUNITY): Payer: BLUE CROSS/BLUE SHIELD | Admitting: Anesthesiology

## 2016-08-29 ENCOUNTER — Encounter (HOSPITAL_COMMUNITY): Payer: Self-pay | Admitting: *Deleted

## 2016-08-29 DIAGNOSIS — N950 Postmenopausal bleeding: Secondary | ICD-10-CM | POA: Diagnosis not present

## 2016-08-29 DIAGNOSIS — N858 Other specified noninflammatory disorders of uterus: Secondary | ICD-10-CM | POA: Diagnosis not present

## 2016-08-29 DIAGNOSIS — I341 Nonrheumatic mitral (valve) prolapse: Secondary | ICD-10-CM | POA: Diagnosis not present

## 2016-08-29 DIAGNOSIS — N879 Dysplasia of cervix uteri, unspecified: Secondary | ICD-10-CM | POA: Diagnosis not present

## 2016-08-29 DIAGNOSIS — N9089 Other specified noninflammatory disorders of vulva and perineum: Secondary | ICD-10-CM | POA: Diagnosis not present

## 2016-08-29 DIAGNOSIS — Z7982 Long term (current) use of aspirin: Secondary | ICD-10-CM | POA: Diagnosis not present

## 2016-08-29 DIAGNOSIS — N840 Polyp of corpus uteri: Secondary | ICD-10-CM | POA: Diagnosis not present

## 2016-08-29 HISTORY — PX: EXCISION OF SKIN TAG: SHX6270

## 2016-08-29 HISTORY — PX: DILATATION & CURETTAGE/HYSTEROSCOPY WITH MYOSURE: SHX6511

## 2016-08-29 SURGERY — DILATATION & CURETTAGE/HYSTEROSCOPY WITH MYOSURE
Anesthesia: General

## 2016-08-29 MED ORDER — KETOROLAC TROMETHAMINE 30 MG/ML IJ SOLN
INTRAMUSCULAR | Status: AC
  Filled 2016-08-29: qty 1

## 2016-08-29 MED ORDER — FENTANYL CITRATE (PF) 100 MCG/2ML IJ SOLN
INTRAMUSCULAR | Status: AC
  Filled 2016-08-29: qty 2

## 2016-08-29 MED ORDER — PROMETHAZINE HCL 25 MG/ML IJ SOLN: 6.2500 mg | INTRAMUSCULAR | Status: DC | PRN

## 2016-08-29 MED ORDER — MIDAZOLAM HCL 2 MG/2ML IJ SOLN
INTRAMUSCULAR | Status: DC | PRN
  Administered 2016-08-29: 1 mg via INTRAVENOUS

## 2016-08-29 MED ORDER — LIDOCAINE HCL (CARDIAC) 20 MG/ML IV SOLN
INTRAVENOUS | Status: DC | PRN
  Administered 2016-08-29: 80 mg via INTRAVENOUS

## 2016-08-29 MED ORDER — LACTATED RINGERS IV SOLN: INTRAVENOUS | Status: DC

## 2016-08-29 MED ORDER — OXYCODONE HCL 5 MG PO TABS: 5.0000 mg | ORAL_TABLET | Freq: Once | ORAL | Status: DC | PRN

## 2016-08-29 MED ORDER — ONDANSETRON HCL 4 MG/2ML IJ SOLN
INTRAMUSCULAR | Status: DC | PRN
  Administered 2016-08-29: 4 mg via INTRAVENOUS

## 2016-08-29 MED ORDER — PROPOFOL 10 MG/ML IV BOLUS
INTRAVENOUS | Status: DC | PRN
  Administered 2016-08-29: 50 mg via INTRAVENOUS
  Administered 2016-08-29: 150 mg via INTRAVENOUS

## 2016-08-29 MED ORDER — OXYCODONE HCL 5 MG/5ML PO SOLN: 5.0000 mg | Freq: Once | ORAL | Status: DC | PRN

## 2016-08-29 MED ORDER — BUPIVACAINE HCL (PF) 0.25 % IJ SOLN
INTRAMUSCULAR | Status: AC
  Filled 2016-08-29: qty 30

## 2016-08-29 MED ORDER — FENTANYL CITRATE (PF) 100 MCG/2ML IJ SOLN: 25.0000 ug | INTRAMUSCULAR | Status: DC | PRN

## 2016-08-29 MED ORDER — KETOROLAC TROMETHAMINE 30 MG/ML IJ SOLN
INTRAMUSCULAR | Status: DC | PRN
  Administered 2016-08-29: 30 mg via INTRAVENOUS

## 2016-08-29 MED ORDER — DEXAMETHASONE SODIUM PHOSPHATE 4 MG/ML IJ SOLN
INTRAMUSCULAR | Status: AC
  Filled 2016-08-29: qty 1

## 2016-08-29 MED ORDER — LACTATED RINGERS IV SOLN
INTRAVENOUS | Status: DC
  Administered 2016-08-29: 10:00:00 via INTRAVENOUS

## 2016-08-29 MED ORDER — PROPOFOL 10 MG/ML IV BOLUS
INTRAVENOUS | Status: AC
Start: 2016-08-29 — End: 2016-08-29
  Filled 2016-08-29: qty 20

## 2016-08-29 MED ORDER — MIDAZOLAM HCL 2 MG/2ML IJ SOLN
INTRAMUSCULAR | Status: AC
  Filled 2016-08-29: qty 2

## 2016-08-29 MED ORDER — SODIUM CHLORIDE 0.9 % IR SOLN
Status: DC | PRN
  Administered 2016-08-29: 100 mL

## 2016-08-29 MED ORDER — ONDANSETRON HCL 4 MG/2ML IJ SOLN
INTRAMUSCULAR | Status: AC
  Filled 2016-08-29: qty 2

## 2016-08-29 MED ORDER — FENTANYL CITRATE (PF) 100 MCG/2ML IJ SOLN
INTRAMUSCULAR | Status: DC | PRN
  Administered 2016-08-29: 50 ug via INTRAVENOUS

## 2016-08-29 MED ORDER — BUPIVACAINE HCL (PF) 0.25 % IJ SOLN
INTRAMUSCULAR | Status: DC | PRN
  Administered 2016-08-29: 1 mL

## 2016-08-29 MED ORDER — LIDOCAINE HCL (CARDIAC) 20 MG/ML IV SOLN
INTRAVENOUS | Status: AC
  Filled 2016-08-29: qty 5

## 2016-08-29 MED ORDER — DEXAMETHASONE SODIUM PHOSPHATE 10 MG/ML IJ SOLN
INTRAMUSCULAR | Status: DC | PRN
  Administered 2016-08-29: 4 mg via INTRAVENOUS

## 2016-08-29 SURGICAL SUPPLY — 23 items
BLADE SURG 15 STRL LF C SS BP (BLADE) ×2 IMPLANT
BLADE SURG 15 STRL SS (BLADE) ×1
CANISTER SUCT 3000ML (MISCELLANEOUS) ×6 IMPLANT
CATH ROBINSON RED A/P 16FR (CATHETERS) ×3 IMPLANT
CLOTH BEACON ORANGE TIMEOUT ST (SAFETY) ×3 IMPLANT
CONTAINER PREFILL 10% NBF 60ML (FORM) ×9 IMPLANT
DEVICE MYOSURE LITE (MISCELLANEOUS) IMPLANT
DEVICE MYOSURE REACH (MISCELLANEOUS) IMPLANT
FILTER ARTHROSCOPY CONVERTOR (FILTER) ×3 IMPLANT
GLOVE BIO SURGEON STRL SZ 6.5 (GLOVE) ×3 IMPLANT
GLOVE BIOGEL PI IND STRL 7.0 (GLOVE) ×4 IMPLANT
GLOVE BIOGEL PI INDICATOR 7.0 (GLOVE) ×2
GOWN STRL REUS W/TWL LRG LVL3 (GOWN DISPOSABLE) ×6 IMPLANT
PACK VAGINAL MINOR WOMEN LF (CUSTOM PROCEDURE TRAY) ×3 IMPLANT
PAD OB MATERNITY 4.3X12.25 (PERSONAL CARE ITEMS) ×3 IMPLANT
SEAL ROD LENS SCOPE MYOSURE (ABLATOR) ×3 IMPLANT
SUT VIC AB 4-0 SH 27 (SUTURE) ×1
SUT VIC AB 4-0 SH 27XANBCTRL (SUTURE) ×2 IMPLANT
SYR 20CC LL (SYRINGE) IMPLANT
TOWEL OR 17X24 6PK STRL BLUE (TOWEL DISPOSABLE) ×6 IMPLANT
TUBING AQUILEX INFLOW (TUBING) ×3 IMPLANT
TUBING AQUILEX OUTFLOW (TUBING) ×3 IMPLANT
WATER STERILE IRR 1000ML POUR (IV SOLUTION) ×3 IMPLANT

## 2016-08-29 NOTE — Anesthesia Postprocedure Evaluation (Signed)
Anesthesia Post Note  Patient: Tiffany Moran  Procedure(s) Performed: Procedure(s) (LRB): DILATATION & CURETTAGE/HYSTEROSCOPY (N/A) EXCISION OF SKIN TAG left labia majora (Left)  Patient location during evaluation: PACU Anesthesia Type: General Level of consciousness: awake and alert Pain management: pain level controlled Vital Signs Assessment: post-procedure vital signs reviewed and stable Respiratory status: spontaneous breathing, nonlabored ventilation, respiratory function stable and patient connected to nasal cannula oxygen Cardiovascular status: blood pressure returned to baseline and stable Postop Assessment: no signs of nausea or vomiting Anesthetic complications: no    Last Vitals:  Vitals:   08/29/16 0923  BP: (!) 143/71  Pulse: 71  Resp: (!) 72  Temp: 36.8 C    Last Pain:  Vitals:   08/29/16 0923  TempSrc: Oral                 [employer]

## 2016-08-29 NOTE — Anesthesia Procedure Notes (Signed)
Procedure Name: LMA Insertion Date/Time: 08/29/2016 10:28 AM Performed by: Jonna Munro Pre-anesthesia Checklist: Patient identified, Emergency Drugs available, Suction available, Patient being monitored and Timeout performed Patient Re-evaluated:Patient Re-evaluated prior to inductionOxygen Delivery Method: Circle system utilized Preoxygenation: Pre-oxygenation with 100% oxygen Intubation Type: IV induction LMA: LMA inserted LMA Size: 4.0 Number of attempts: 1 Placement Confirmation: positive ETCO2 and breath sounds checked- equal and bilateral Tube secured with: Tape Dental Injury: Teeth and Oropharynx as per pre-operative assessment

## 2016-08-29 NOTE — Op Note (Signed)
Preoperative Diagnosis: Postmenopausal bleeding  Postoperative Diagnosis: Same  Procedure: Hysteroscopy, dilation and curettage, removal of left vulvar lesion  Surgeon: Dr Sumner Boast  Assistants: None  Anesthesia: General via LMA  EBL: 5 cc  Fluids: 700 cc  Fluid deficit: 30 cc  Urine output: not recorded  Indications for surgery: The patient is a 61 yo female, who presented with postmenopausal bleeding and a left vulvar skin tag that was bothersome to her. Work up included an ultrasound that showed a uterine cavity filled with fluid with layering echos, c/w blood. There was also a filling defect that looked like a polyp. Pap from 6/17 returned as ASCUS, negative HPV The risks of the surgery were reviewed with the patient and the consent form was signed prior to her surgery.  Findings: Small left vulvar skin tag. EUA: normal sized, mobile uterus, no adnexal masses. Hysteroscopy revealed a normal intrauterine cavity with normal tubal ostia bilaterally and atrophic appearing endometrium. No polyp was noted. Normal cervical canal  Specimens: left vulvar biopsy, endometrial curetting's, endocervical curreting's   Procedure: The patient was taken to the operating room with an IV in place. She was placed in the dorsal lithotomy position and anesthesia was administered. She was prepped and draped in the usual sterile fashion for a vaginal procedure. She voided on the way to the OR. The skin tag on the left vulva was elevated with a pick up and removed with a #15 blade. A single stitch of 4-0 Vicryl was placed. Local with .25% marcaine was injected under the biopsy site. A weighted speculum was placed in the vagina and a single tooth tenaculum was placed on the anterior lip of the cervix. The cervix was dilated to a #7 hagar dilator. The uterus was sounded to 7 cm. The myosure hysteroscope was inserted into the uterine cavity. With continuous infusion of normal saline, the uterine cavity was  visualized with the above findings. The myosure was then removed. An endocervical curettage was obtained. The cavity was then curetted with the small sharp curette. The cavity had the characteristically gritty texture at the end of the procedure. The curette and the single tooth tenaculum were removed. The speculum was removed. The patients perineum was cleansed of betadine and she was taken out of the dorsal lithotomy position.  Upon awakening the LMA was removed and the patient was transferred to the recovery room in stable and awake condition.  The sponge and instrument count were correct. There were no complications.

## 2016-08-29 NOTE — Anesthesia Preprocedure Evaluation (Addendum)
Anesthesia Evaluation  Patient identified by MRN, date of birth, ID band Patient awake    Reviewed: Allergy & Precautions, NPO status , Patient's Chart, lab work & pertinent test results  History of Anesthesia Complications (+) PONV  Airway Mallampati: III  TM Distance: >3 FB Neck ROM: Full    Dental no notable dental hx.    Pulmonary neg pulmonary ROS,    Pulmonary exam normal breath sounds clear to auscultation       Cardiovascular Normal cardiovascular exam Rhythm:Regular Rate:Normal     Neuro/Psych negative neurological ROS  negative psych ROS   GI/Hepatic negative GI ROS, Neg liver ROS,   Endo/Other  negative endocrine ROS  Renal/GU negative Renal ROS  negative genitourinary   Musculoskeletal negative musculoskeletal ROS (+)   Abdominal   Peds negative pediatric ROS (+)  Hematology negative hematology ROS (+)   Anesthesia Other Findings   Reproductive/Obstetrics negative OB ROS                            Anesthesia Physical Anesthesia Plan  ASA: II  Anesthesia Plan: General   Post-op Pain Management:    Induction: Intravenous  Airway Management Planned: LMA  Additional Equipment:   Intra-op Plan:   Post-operative Plan: Extubation in OR  Informed Consent: I have reviewed the patients History and Physical, chart, labs and discussed the procedure including the risks, benefits and alternatives for the proposed anesthesia with the patient or authorized representative who has indicated his/her understanding and acceptance.   Dental advisory given  Plan Discussed with: CRNA and Surgeon  Anesthesia Plan Comments:         Anesthesia Quick Evaluation

## 2016-08-29 NOTE — Transfer of Care (Signed)
Immediate Anesthesia Transfer of Care Note  Patient: Tiffany Moran  Procedure(s) Performed: Procedure(s): DILATATION & CURETTAGE/HYSTEROSCOPY (N/A) EXCISION OF SKIN TAG left labia majora (Left)  Patient Location: PACU  Anesthesia Type:General  Level of Consciousness: awake, alert  and oriented  Airway & Oxygen Therapy: Patient Spontanous Breathing and Patient connected to nasal cannula oxygen  Post-op Assessment: Report given to RN and Post -op Vital signs reviewed and stable  Post vital signs: Reviewed and stable  Last Vitals:  Vitals:   08/29/16 0923  BP: (!) 143/71  Pulse: 71  Resp: (!) 72  Temp: 36.8 C    Last Pain:  Vitals:   08/29/16 0923  TempSrc: Oral      Patients Stated Pain Goal: 6 ([id number])  Complications: No apparent anesthesia complications

## 2016-08-29 NOTE — Discharge Instructions (Signed)

## 2016-08-29 NOTE — Interval H&P Note (Signed)
History and Physical Interval Note:  08/29/2016 10:05 AM  Tiffany Moran  has presented today for surgery, with the diagnosis of PMB, endometrail polyp, vulvar lesion  The various methods of treatment have been discussed with the patient and family. After consideration of risks, benefits and other options for treatment, the patient has consented to  Procedure(s): DILATATION & CURETTAGE/HYSTEROSCOPY WITH MYOSURE (N/A) EXCISION OF SKIN TAG left labia majora (Left) as a surgical intervention .  The patient's history has been reviewed, patient examined, no change in status, stable for surgery.  I have reviewed the patient's chart and labs.  Questions were answered to the patient's satisfaction.    Patients Cardiologist cleared her for surgery.    Salvadore Dom

## 2016-08-30 ENCOUNTER — Telehealth: Payer: Self-pay | Admitting: Obstetrics and Gynecology

## 2016-08-30 NOTE — Telephone Encounter (Signed)
Patient called and said, "I had surgery yesterday and I'd like to speak with Gay Filler. I think part of the surgery was not done."

## 2016-08-30 NOTE — Telephone Encounter (Signed)
Patient concerned that skin tag was not removed during procedure. Advised after review with Dr Talbert Nan that skin tag was removed and stitch is present.  Patient states the area she requested be removed is still present. Per Dr Talbert Nan. Additional skin tag removal can still be completed in office. patient declined office visit today. Will complete at post op.   Routing to provider for final review. Patient agreeable to disposition. Will close encounter.

## 2016-08-30 NOTE — Telephone Encounter (Signed)
I spoke to the patient, she states her husband looked and thinks the correct area was removed. I reviewed the normal pathology report. She will f/u at her post op visit.

## 2016-08-30 NOTE — Telephone Encounter (Signed)
Patient says she's calling Gay Filler back.

## 2016-08-31 ENCOUNTER — Encounter (HOSPITAL_COMMUNITY): Payer: Self-pay | Admitting: Obstetrics and Gynecology

## 2016-08-31 NOTE — Telephone Encounter (Signed)
Pt is OK to proceed with surgery  Low cardiac risk

## 2016-09-04 ENCOUNTER — Telehealth: Payer: Self-pay | Admitting: Obstetrics and Gynecology

## 2016-09-04 NOTE — Telephone Encounter (Signed)
Spoke with patient. Patient reports at approximately 10am this morning she has a gush of bright red blood while doing housework. Patient did not have a pad on at the time, as she reports she has had only spotting since her procedure last Tuesday 08/29/16. Patient states the amount of blood was comparable to "two fifty cent pieces" in her underwear. Patient states she has be lying down since and placed a peri pad. Patient reports no bleeding at this time. Patient states the area where skin tag was removed is swollen, slightly uncomfortable and tender to touch. Patient states she has been using ice and motrin for pain relief. Patient reports increase in hot flashes and cold chills, fluctuating back and forth. Patient states she has not checked her temperature and reports she does not feel "feverish". Patient had D&C, hysteroscopy with myosure and excision of skin tag. Advise patient continue to monitor bleeding, will review with Dr. Talbert Nan for further recommendations and return call.

## 2016-09-04 NOTE — Telephone Encounter (Signed)
Spoke with patient. Reviewed Dr. Albertina Senegal note as seen below. Patients states she does not "feel horrible" and denies fever or abdominal pain at this time. Patient reports scant amount of blood on sanitary pad and "few drops in commode" when voiding since last telephone encounter. Patients states she will keep her 09/11/16 follow-up appointment at this time. Advised patient should the bleeding increase to soaking a pad per hour, fever or abdominal pain; call office for visit or if after hours ER or urgent care. Patient asked what she could do for housework; advised patient no lifting greater than 10lbs, and increase activity slowly,  as she feels comfortable, allow for rest.   Dr. Talbert Nan, do you agree with these recommendations?

## 2016-09-04 NOTE — Telephone Encounter (Signed)
I wouldn't worry about the small amount of bleeding. But if she isn't feeling well, she should come in for evaluation.  She definitely should come in if she has a fever or lower abdominal pain.

## 2016-09-04 NOTE — Telephone Encounter (Signed)
Patient had surgery on 08/29/16 and is having some bleeding. She would like to speak with the nurse.

## 2016-09-11 ENCOUNTER — Ambulatory Visit (INDEPENDENT_AMBULATORY_CARE_PROVIDER_SITE_OTHER): Payer: BLUE CROSS/BLUE SHIELD | Admitting: Obstetrics and Gynecology

## 2016-09-11 ENCOUNTER — Encounter: Payer: Self-pay | Admitting: Obstetrics and Gynecology

## 2016-09-11 VITALS — BP 126/72 | HR 64 | Resp 14 | Wt 143.0 lb

## 2016-09-11 DIAGNOSIS — Z9889 Other specified postprocedural states: Secondary | ICD-10-CM

## 2016-09-11 NOTE — Progress Notes (Signed)
GYNECOLOGY  VISIT   HPI: 60 y.o.   Married  Caucasian  female   G2P2 with Patient's last menstrual period was 06/17/2009.   here 13 days s/p D&C hysteroscopy and skin  Tag removal. Negative hysteroscopy, pathology benign.  She had some chills after surgery, but no fever. She has had some LLQ abdominal pain, thinks its GI, working on eating the right foods to help with BM. Spotting stopped 3 days ago. Still feels sore where the skin tag was removed on the left vulva.   GYNECOLOGIC HISTORY: Patient's last menstrual period was 06/17/2009. Contraception:postmenospuase Menopausal hormone therapy: none at this time        OB History    Gravida Para Term Preterm AB Living   2 2       2    SAB TAB Ectopic Multiple Live Births           2         Patient Active Problem List   Diagnosis Date Noted  . 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 Pes anserine bursitis 04/21/2014  . 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 Osteopenia 08/04/2011  . Unspecified vitamin D deficiency 08/04/2011  . History of mitral valve prolapse 07/26/2010  . 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 RAYNAUD'S SYNDROME 01/05/2010  . Hyperlipidemia 08/17/2009  . POLYARTHRITIS 08/17/2009    Past Medical History:  Diagnosis Date  . Cardiospasm 1983   felt may be related to MVP  . Hyperlipidemia   . Kidney cysts 11/2015   cyst on one of her kidneys, no treatment required  . Mitral valve prolapse   . PONV (postoperative nausea and vomiting)    teeth chipped  . Raynaud disease   . Retinal hole of right eye   . Shingles 2012   leg  . Tachycardia     Past Surgical History:  Procedure Laterality Date  . COLONOSCOPY  2007   [facility], [place] ,[name]  . DILATATION & CURETTAGE/HYSTEROSCOPY WITH MYOSURE N/A 08/29/2016   Procedure: DILATATION & CURETTAGE/HYSTEROSCOPY;  Surgeon: Salvadore Dom, MD;  Location: [facility];  Service: Gynecology;  Laterality: N/A;  . DILATION AND CURETTAGE OF UTERUS    .  EXCISION OF SKIN TAG Left 08/29/2016   Procedure: EXCISION OF SKIN TAG left labia majora;  Surgeon: Salvadore Dom, MD;  Location: [facility];  Service: Gynecology;  Laterality: Left;  . EXPLORATORY LAPAROTOMY     X2 for pain: fibroid, enlarged oviduct  . RETINAL LASER PROCEDURE Right 08/2015  . SALPINGECTOMY  2007 or 2008   and fibroid removed  . TUBAL LIGATION      Current Outpatient Prescriptions  Medication Sig Dispense Refill  . aspirin EC 81 MG tablet Take 1 tablet (81 mg total) by mouth daily. 90 tablet 3  . b complex vitamins tablet Take 1 tablet by mouth daily.    . Cholecalciferol (VITAMIN D) 2000 units CAPS Take 2,000 Units by mouth daily.    Marland Kitchen ibuprofen (ADVIL,MOTRIN) 200 MG tablet Take 400 mg by mouth every 6 (six) hours as needed for headache or mild pain.    . metoprolol succinate (TOPROL-XL) 25 MG 24 hr tablet Take 1 tablet (25 mg total) by mouth daily. 90 tablet 3  . rosuvastatin (CRESTOR) 5 MG tablet Take 0.5 tablets (2.5 mg total) by mouth daily. Except Wednesdays, take a whole tablet. 55 tablet 3  . estradiol (ESTRACE VAGINAL) 0.1 MG/GM vaginal cream Use 1/2 gm intravaginally twice a week. (Patient not taking:  Reported on 09/11/2016) 42.5 g 3   No current facility-administered medications for this visit.      ALLERGIES: Lipitor [atorvastatin]  Family History  Problem Relation Age of Onset  . Heart attack Father 54  . Myasthenia gravis Father   . Hypertension Father   . Hyperlipidemia Father   . Osteoporosis Father     due to steroids for Myasthenia Grvis  . Leukemia Mother 15  . Hyperlipidemia Mother   . Hypertension Mother   . Hypothyroidism Mother   . Prostate cancer Brother     in 98s  . Stroke Maternal Grandfather     in 52s  . Diabetes Neg Hx     Social History   Social History  . Marital status: Married    Spouse name: N/A  . Number of children: N/A  . Years of education: N/A   Occupational History  . Not on file.   Social History Main  Topics  . Smoking status: Never Smoker  . Smokeless tobacco: Never Used  . Alcohol use No  . Drug use: No  . Sexual activity: Yes    Birth control/ protection: Other-see comments, Post-menopausal     Comment: BTL   Other Topics Concern  . Not on file   Social History Narrative   Exercises regularly    Review of Systems  Constitutional: Negative.   HENT: Negative.   Eyes: Negative.   Respiratory: Negative.   Cardiovascular: Negative.   Gastrointestinal: Negative.   Genitourinary:       Vaginal redness swelling and tenderness around the area the skin tag was removed  Musculoskeletal: Negative.   Skin: Negative.   Neurological: Negative.   Endo/Heme/Allergies: Negative.   Psychiatric/Behavioral: Negative.     PHYSICAL EXAMINATION:    BP 126/72 (BP Location: Right Arm, Patient Position: Sitting, Cuff Size: Normal)   Pulse 64   Resp 14   Wt 143 lb (64.9 kg)   LMP 06/17/2009   BMI 22.74 kg/m     General appearance: alert, cooperative and appears stated age  Pelvic: External genitalia:  no lesions, still healing from left vulvar biopsy, stitch being absorbed. No signs of infection.               Cervix: no cervical motion tenderness              Bimanual Exam:  Uterus:  normal size, contour, position, consistency, mobility, non-tender and anteverted              Adnexa: no mass, fullness, tenderness               Chaperone was present for exam.  ASSESSMENT PMP bleeding, negative hysteroscopy/D&C, atrophic endometrium on pathology Skin tag removed, still healing    PLAN Discussed PMP bleeding and her normal pathology Discussed healing of her vulvar skin tag Answered questions about her hormones and pap smear   An After Visit Summary was printed and given to the patient.  15 minutes face to face time of which over 50% was spent in counseling.

## 2016-09-13 NOTE — Telephone Encounter (Signed)
Dr. Talbert Nan, patient seen in office 09/11/16. OK to close encounter?

## 2016-09-26 ENCOUNTER — Other Ambulatory Visit: Payer: BLUE CROSS/BLUE SHIELD

## 2016-09-27 ENCOUNTER — Other Ambulatory Visit: Payer: BLUE CROSS/BLUE SHIELD | Admitting: *Deleted

## 2016-09-27 DIAGNOSIS — E785 Hyperlipidemia, unspecified: Secondary | ICD-10-CM | POA: Diagnosis not present

## 2016-09-27 DIAGNOSIS — I471 Supraventricular tachycardia: Secondary | ICD-10-CM | POA: Diagnosis not present

## 2016-09-27 LAB — LIPID PANEL
Cholesterol: 163 mg/dL (ref 125–200)
HDL: 77 mg/dL (ref >=46)
LDL Cholesterol: 70 mg/dL (ref <130)
Total CHOL/HDL Ratio: 2.1 Ratio (ref <=5.0)
Triglycerides: 81 mg/dL (ref <150)
VLDL: 16 mg/dL (ref <30)

## 2016-11-08 ENCOUNTER — Ambulatory Visit (INDEPENDENT_AMBULATORY_CARE_PROVIDER_SITE_OTHER): Payer: BLUE CROSS/BLUE SHIELD | Admitting: Nurse Practitioner

## 2016-11-08 ENCOUNTER — Encounter: Payer: Self-pay | Admitting: *Deleted

## 2016-11-08 ENCOUNTER — Encounter: Payer: Self-pay | Admitting: Nurse Practitioner

## 2016-11-08 ENCOUNTER — Other Ambulatory Visit (INDEPENDENT_AMBULATORY_CARE_PROVIDER_SITE_OTHER): Payer: BLUE CROSS/BLUE SHIELD

## 2016-11-08 VITALS — BP 136/82 | HR 78 | Temp 98.4°F | Ht 66.0 in | Wt 144.0 lb

## 2016-11-08 DIAGNOSIS — E0789 Other specified disorders of thyroid: Secondary | ICD-10-CM

## 2016-11-08 LAB — CBC WITH DIFFERENTIAL/PLATELET
BASOS PCT: 0.4 % (ref 0.0–3.0)
Basophils Absolute: 0 10*3/uL (ref 0.0–0.1)
EOS ABS: 0.1 10*3/uL (ref 0.0–0.7)
EOS PCT: 1.5 % (ref 0.0–5.0)
HEMATOCRIT: 40 % (ref 36.0–46.0)
HEMOGLOBIN: 13.6 g/dL (ref 12.0–15.0)
LYMPHS PCT: 25.4 % (ref 12.0–46.0)
Lymphs Abs: 1.6 10*3/uL (ref 0.7–4.0)
MCHC: 33.9 g/dL (ref 30.0–36.0)
MCV: 88 fl (ref 78.0–100.0)
MONO ABS: 0.3 10*3/uL (ref 0.1–1.0)
Monocytes Relative: 5.4 % (ref 3.0–12.0)
Neutro Abs: 4.3 10*3/uL (ref 1.4–7.7)
Neutrophils Relative %: 67.3 % (ref 43.0–77.0)
Platelets: 196 10*3/uL (ref 150.0–400.0)
RBC: 4.54 Mil/uL (ref 3.87–5.11)
RDW: 13.2 % (ref 11.5–15.5)
WBC: 6.4 10*3/uL (ref 4.0–10.5)

## 2016-11-08 LAB — T4, FREE: FREE T4: 1.01 ng/dL (ref 0.60–1.60)

## 2016-11-08 LAB — SEDIMENTATION RATE: SED RATE: 7 mm/h (ref 0–30)

## 2016-11-08 LAB — TSH: TSH: 1.09 u[IU]/mL (ref 0.35–4.50)

## 2016-11-08 NOTE — Patient Instructions (Signed)
You will be called with lab results. You will be called to schedule thyroid Ultrasound

## 2016-11-08 NOTE — Progress Notes (Signed)
Pre visit review using our clinic review tool, if applicable. No additional management support is needed unless otherwise documented below in the visit note. 

## 2016-11-08 NOTE — Progress Notes (Signed)
Subjective:  Patient ID: Tiffany Moran, female    DOB: 28-May-1956  Age: 60 y.o. MRN: [id number]  CC: Neck Pain (pt stated sore on right side of her neck for 3 wks, took tylanol. )   Neck Pain   This is a new problem. The current episode started 1 to 4 weeks ago (3weeks). The problem occurs intermittently. The problem has been waxing and waning. The pain is associated with nothing. The pain is present in the right side. The quality of the pain is described as stabbing (and sharp). Nothing aggravates the symptoms. Stiffness is present: no stiffness. Associated symptoms include pain with swallowing. Pertinent negatives include no chest pain, fever, headaches, numbness, paresis, photophobia, syncope, tingling, trouble swallowing, visual change, weakness or weight loss. Associated symptoms comments: No GERD symptoms, has chronic post nasal drip which has not changed. She has tried nothing for the symptoms.  states she has hx of thyroid storm 32yr ago. She is concerned this might be related to that again.  Outpatient Medications Prior to Visit  Medication Sig Dispense Refill  . aspirin EC 81 MG tablet Take 1 tablet (81 mg total) by mouth daily. 90 tablet 3  . b complex vitamins tablet Take 1 tablet by mouth daily.    . Cholecalciferol (VITAMIN D) 2000 units CAPS Take 2,000 Units by mouth daily.    .Marland Kitchenestradiol (ESTRACE VAGINAL) 0.1 MG/GM vaginal cream Use 1/2 gm intravaginally twice a week. 42.5 g 3  . ibuprofen (ADVIL,MOTRIN) 200 MG tablet Take 400 mg by mouth every 6 (six) hours as needed for headache or mild pain.    . metoprolol succinate (TOPROL-XL) 25 MG 24 hr tablet Take 1 tablet (25 mg total) by mouth daily. 90 tablet 3  . rosuvastatin (CRESTOR) 5 MG tablet Take 0.5 tablets (2.5 mg total) by mouth daily. Except Wednesdays, take a whole tablet. 55 tablet 3   No facility-administered medications prior to visit.     ROS See HPI  Objective:  BP 136/82 (BP Location: Right Arm, Patient  Position: Sitting, Cuff Size: Normal)   Pulse 78   Temp 98.4 F (36.9 C) (Oral)   Ht 5' 6"  (1.676 m)   Wt 144 lb (65.3 kg)   LMP 06/17/2009   SpO2 96%   BMI 23.24 kg/m   BP Readings from Last 3 Encounters:  11/08/16 136/82  09/11/16 126/72  08/29/16 131/67    Wt Readings from Last 3 Encounters:  11/08/16 144 lb (65.3 kg)  09/11/16 143 lb (64.9 kg)  08/28/16 141 lb 2 oz (64 kg)    Physical Exam  Constitutional: She is oriented to person, place, and time.  HENT:  Right Ear: Tympanic membrane, external ear and ear canal normal.  Left Ear: Tympanic membrane, external ear and ear canal normal.  Nose: Nose normal.  Mouth/Throat: Uvula is midline, oropharynx is clear and moist and mucous membranes are normal. No oropharyngeal exudate or posterior oropharyngeal erythema.  Neck: Normal range of motion. Neck supple. No JVD present. No thyromegaly present.  Tenderness over right lobe of thyroid. No nodule or mass palpated.  Cardiovascular: Normal rate.   Pulmonary/Chest: Effort normal. No stridor.  Lymphadenopathy:    She has no cervical adenopathy.  Neurological: She is alert and oriented to person, place, and time.    Lab Results  Component Value Date   WBC 4.9 08/28/2016   HGB 13.8 08/28/2016   HCT 40.6 08/28/2016   PLT 178 08/28/2016   GLUCOSE 95 08/28/2016  CHOL 163 09/27/2016   TRIG 81 09/27/2016   HDL 77 09/27/2016   LDLCALC 70 09/27/2016   ALT 20 02/14/2016   AST 17 02/14/2016   NA 140 08/28/2016   K 4.2 08/28/2016   CL 105 08/28/2016   CREATININE 0.70 08/28/2016   BUN 14 08/28/2016   CO2 28 08/28/2016   TSH 1.65 02/14/2016    No results found.  Assessment & Plan:   Valari was seen today for neck pain.  Diagnoses and all orders for this visit:  Thyroid pain -     TSH; Future -     T4, free; Future -     US THYROID; Future -     CBC w/Diff; Future -     Sed Rate (ESR); Future   I am having Ms. Shadowens maintain her metoprolol succinate, aspirin  EC, Vitamin D, rosuvastatin, estradiol, b complex vitamins, and ibuprofen.  No orders of the defined types were placed in this encounter.  Recent Results (from the past 2160 hour(s))  POCT Urinalysis Dipstick     Status: Normal   Collection Time: 08/17/16 10:19 AM  Result Value Ref Range   Color, UA yellow    Clarity, UA clear    Glucose, UA neg    Bilirubin, UA neg    Ketones, UA neg    Spec Grav, UA     Blood, UA neg    pH, UA 6.5    Protein, UA neg    Urobilinogen, UA negative    Nitrite, UA neg    Leukocytes, UA Negative Negative  CBC     Status: None   Collection Time: 08/28/16 10:15 AM  Result Value Ref Range   WBC 4.9 4.0 - 10.5 K/uL   RBC 4.51 3.87 - 5.11 MIL/uL   Hemoglobin 13.8 12.0 - 15.0 g/dL   HCT 40.6 36.0 - 46.0 %   MCV 90.0 78.0 - 100.0 fL   MCH 30.6 26.0 - 34.0 pg   MCHC 34.0 30.0 - 36.0 g/dL   RDW 13.1 11.5 - 15.5 %   Platelets 178 150 - 400 K/uL  Basic metabolic panel     Status: None   Collection Time: 08/28/16 10:15 AM  Result Value Ref Range   Sodium 140 135 - 145 mmol/L   Potassium 4.2 3.5 - 5.1 mmol/L   Chloride 105 101 - 111 mmol/L   CO2 28 22 - 32 mmol/L   Glucose, Bld 95 65 - 99 mg/dL   BUN 14 6 - 20 mg/dL   Creatinine, Ser 0.70 0.44 - 1.00 mg/dL   Calcium 9.7 8.9 - 10.3 mg/dL   GFR calc non Af Amer >60 >60 mL/min   GFR calc Af Amer >60 >60 mL/min    Comment: (NOTE) The eGFR has been calculated using the CKD EPI equation. This calculation has not been validated in all clinical situations. eGFR's persistently <60 mL/min signify possible Chronic Kidney Disease.    Anion gap 7 5 - 15  Lipid panel     Status: None   Collection Time: 09/27/16  7:32 AM  Result Value Ref Range   Cholesterol 163 125 - 200 mg/dL   Triglycerides 81 <150 mg/dL   HDL 77 >=46 mg/dL   Total CHOL/HDL Ratio 2.1 <=5.0 Ratio   VLDL 16 <30 mg/dL   LDL Cholesterol 70 <130 mg/dL    Comment:   Total Cholesterol/HDL Ratio:CHD Risk  Coronary  Heart Disease Risk Table                                        Men       Women          1/2 Average Risk              3.4        3.3              Average Risk              5.0        4.4           2X Average Risk              9.6        7.1           3X Average Risk             23.4       11.0 Use the calculated Patient Ratio above and the CHD Risk table  to determine the patient's CHD Risk.   TSH     Status: None   Collection Time: 11/08/16  3:10 PM  Result Value Ref Range   TSH 1.09 0.35 - 4.50 uIU/mL  T4, free     Status: None   Collection Time: 11/08/16  3:10 PM  Result Value Ref Range   Free T4 1.01 0.60 - 1.60 ng/dL    Comment: Specimens from patients who are undergoing biotin therapy and /or ingesting biotin supplements may contain high levels of biotin.  The higher biotin concentration in these specimens interferes with this Free T4 assay.  Specimens that contain high levels  of biotin may cause false high results for this Free T4 assay.  Please interpret results in light of the total clinical presentation of the patient.    CBC w/Diff     Status: None   Collection Time: 11/08/16  3:10 PM  Result Value Ref Range   WBC 6.4 4.0 - 10.5 K/uL   RBC 4.54 3.87 - 5.11 Mil/uL   Hemoglobin 13.6 12.0 - 15.0 g/dL   HCT 40.0 36.0 - 46.0 %   MCV 88.0 78.0 - 100.0 fl   MCHC 33.9 30.0 - 36.0 g/dL   RDW 13.2 11.5 - 15.5 %   Platelets 196.0 150.0 - 400.0 K/uL   Neutrophils Relative % 67.3 43.0 - 77.0 %   Lymphocytes Relative 25.4 12.0 - 46.0 %   Monocytes Relative 5.4 3.0 - 12.0 %   Eosinophils Relative 1.5 0.0 - 5.0 %   Basophils Relative 0.4 0.0 - 3.0 %   Neutro Abs 4.3 1.4 - 7.7 K/uL   Lymphs Abs 1.6 0.7 - 4.0 K/uL   Monocytes Absolute 0.3 0.1 - 1.0 K/uL   Eosinophils Absolute 0.1 0.0 - 0.7 K/uL   Basophils Absolute 0.0 0.0 - 0.1 K/uL  Sed Rate (ESR)     Status: None   Collection Time: 11/08/16  3:10 PM  Result Value Ref Range   Sed Rate 7 0 - 30 mm/hr    Follow-up: No  Follow-up on file.  Wilfred Lacy, NP

## 2016-11-16 DIAGNOSIS — H43391 Other vitreous opacities, right eye: Secondary | ICD-10-CM | POA: Diagnosis not present

## 2016-11-16 DIAGNOSIS — H2513 Age-related nuclear cataract, bilateral: Secondary | ICD-10-CM | POA: Diagnosis not present

## 2016-11-16 DIAGNOSIS — H04123 Dry eye syndrome of bilateral lacrimal glands: Secondary | ICD-10-CM | POA: Diagnosis not present

## 2016-11-16 DIAGNOSIS — H40013 Open angle with borderline findings, low risk, bilateral: Secondary | ICD-10-CM | POA: Diagnosis not present

## 2016-11-19 ENCOUNTER — Telehealth: Payer: Self-pay | Admitting: Internal Medicine

## 2016-11-19 DIAGNOSIS — D1803 Hemangioma of intra-abdominal structures: Secondary | ICD-10-CM

## 2016-11-19 DIAGNOSIS — N281 Cyst of kidney, acquired: Secondary | ICD-10-CM

## 2016-11-19 NOTE — Telephone Encounter (Signed)
Due for repeat abd [place] for follow up of hemangioma and renal cyst   [place] ordered - please call and let her know

## 2016-11-20 ENCOUNTER — Ambulatory Visit
Admission: RE | Admit: 2016-11-20 | Discharge: 2016-11-20 | Disposition: A | Discharge status: Home / Self Care | Payer: BLUE CROSS/BLUE SHIELD | Source: Ambulatory Visit | Attending: Nurse Practitioner | Admitting: Nurse Practitioner

## 2016-11-20 DIAGNOSIS — E042 Nontoxic multinodular goiter: Secondary | ICD-10-CM | POA: Diagnosis not present

## 2016-11-20 DIAGNOSIS — E0789 Other specified disorders of thyroid: Secondary | ICD-10-CM

## 2016-11-22 ENCOUNTER — Telehealth: Payer: Self-pay | Admitting: Emergency Medicine

## 2016-11-22 ENCOUNTER — Other Ambulatory Visit: Payer: Self-pay | Admitting: Nurse Practitioner

## 2016-11-22 DIAGNOSIS — E049 Nontoxic goiter, unspecified: Secondary | ICD-10-CM | POA: Insufficient documentation

## 2016-11-22 DIAGNOSIS — E0789 Other specified disorders of thyroid: Secondary | ICD-10-CM

## 2016-11-22 DIAGNOSIS — E041 Nontoxic single thyroid nodule: Secondary | ICD-10-CM

## 2016-11-22 NOTE — Telephone Encounter (Signed)
Routing to charlotte---I can see a final result on [place] of patient's thyroid, but I dont see comments from you----please advise, I will call patient back, thanks

## 2016-11-22 NOTE — Telephone Encounter (Signed)
Pt called and wants to know if you can call her with the results of her [place] when they are available. Please advise thanks.

## 2016-11-22 NOTE — Telephone Encounter (Signed)
Please advise patient on conmments. Thank you

## 2016-11-23 ENCOUNTER — Other Ambulatory Visit: Payer: Self-pay | Admitting: Nurse Practitioner

## 2016-11-23 NOTE — Telephone Encounter (Signed)
Advised patient of charlottes note/instructions---patient has jan appt already scheduled---advised patient to seek immediate hel[p should she experience airway difficulties

## 2016-12-01 ENCOUNTER — Ambulatory Visit (INDEPENDENT_AMBULATORY_CARE_PROVIDER_SITE_OTHER): Payer: BLUE CROSS/BLUE SHIELD | Admitting: Internal Medicine

## 2016-12-01 ENCOUNTER — Encounter: Payer: Self-pay | Admitting: Internal Medicine

## 2016-12-01 VITALS — BP 112/60 | HR 84 | Ht 66.0 in | Wt 144.0 lb

## 2016-12-01 DIAGNOSIS — E042 Nontoxic multinodular goiter: Secondary | ICD-10-CM

## 2016-12-01 NOTE — Patient Instructions (Signed)
Please stop at the lab.  If the antibodies are high, then you can try Selenium 200 mcg daily.  Please return in 1 year.

## 2016-12-01 NOTE — Progress Notes (Signed)
Patient ID: Tiffany Moran, female   DOB: 08/20/1956, 60 y.o.   MRN: [id number]   HPI  Tiffany Moran is a 60 y.o.-year-old female, referred by her PCP, Dr. Quay Burow, for evaluation for thyroid nodules.  She describes that she had a "thyroid storm" episode 20 years ago.  In 10/2016, she noticed pain with swallowing. She took Advil >> not helping >> worse >> saw PCP >> TFTs normal. She also had a thyroid U/S >> thyroid nodules.  Thyroid U/S (11/20/2016):  Isthmus: Measures 0.4 cm in thickness. Small hypoechoic structure just superior to the isthmus measures 0.3 cm. This may represent a small node.  Right lobe: Measures 4.1 x 1.7 x 1.7 cm. Right thyroid tissue is diffusely heterogeneous without a discrete nodule. Small lymph nodes on the right side of the neck.  Left lobe: Measures 5.0 x 1.3 x 1.7 cm. Nodule # 1: Location: Left; Superior Size: 0.7 x 0.4 x 0.6 cm. Composition: solid/almost completely solid (2) Echogenicity: cannot determine (1) ACR TI-RADS total points: 3.   Nodule # 2: Location: Left; Mid Size: 1.2 x 0.6 x 0.7 cm. Composition: solid/almost completely solid (2) Echogenic foci: punctate echogenic foci (3), questionable ACR TI-RADS total points: 6.  Nodule # 3: Location: Left; Inferior Size: 0.6 x 0.4 x 0.3 cm. Composition: solid/almost completely solid (2) Echogenicity: hypoechoic (2) Margins: extra-thyroidal extension (3) Echogenic foci: punctate echogenic foci (3) ACR TI-RADS total points: 10.  Small lymph nodes on left side of the neck.  One of the nodules was described as possible parathyroid enlargement, however, patient's calcium levels have not been elevated: Lab Results  Component Value Date   CALCIUM 9.7 08/28/2016   CALCIUM 9.8 02/14/2016   CALCIUM 9.8 05/07/2015   CALCIUM 9.8 05/19/2014   CALCIUM 9.8 05/08/2013   Pt denies: - feeling nodules in neck - hoarseness - dysphagia - choking - SOB with lying down But had pain with swallowing  - see  above.  I reviewed pt's thyroid tests: Lab Results  Component Value Date   TSH 1.09 11/08/2016   TSH 1.65 02/14/2016   TSH 1.823 05/25/2015   TSH 2.309 05/19/2014   TSH 1.79 01/30/2014   TSH 1.63 12/25/2012   TSH 1.87 12/11/2011   TSH 1.35 01/05/2010   FREET4 1.01 11/08/2016   FREET4 0.9 01/05/2010    Pt denies: - fatigue - heat intolerance/+ cold intolerance - tremors - palpitations - anxiety/depression - hyperdefecation/constipation - weight loss/weight gain - dry skin - hair loss  + FH of thyroid ds.: both parents - hypothyroidism No FH of thyroid cancer. No h/o radiation tx to head or neck. Father with MG.  No seaweed or kelp. No recent contrast studies. No steroid use. No herbal supplements. + Biotin supplement - not in last few weeks.  ROS: Constitutional: no weight gain/loss, no fatigue, + subjective hypothermia Eyes: no blurry vision, no xerophthalmia ENT: no sore throat, no nodules palpated in throat, no dysphagia/odynophagia, no hoarseness Cardiovascular: no CP/SOB/palpitations/leg swelling Respiratory: no cough/SOB Gastrointestinal: no N/V/D/C Musculoskeletal: no muscle/joint aches Skin: no rashes Neurological: no tremors/numbness/tingling/dizziness Psychiatric: no depression/anxiety  Past Medical History:  Diagnosis Date  . Cardiospasm 1983   felt may be related to MVP  . Hyperlipidemia   . Kidney cysts 11/2015   cyst on one of her kidneys, no treatment required  . Mitral valve prolapse   . PONV (postoperative nausea and vomiting)    teeth chipped  . Raynaud disease   . Retinal hole of right eye   .  Shingles 2012   leg  . Tachycardia    Past Surgical History:  Procedure Laterality Date  . COLONOSCOPY  2007   [place], [place] ,[name]  . DILATATION & CURETTAGE/HYSTEROSCOPY WITH MYOSURE N/A 08/29/2016   Procedure: DILATATION & CURETTAGE/HYSTEROSCOPY;  Surgeon: Salvadore Dom, MD;  Location: [facility];  Service: Gynecology;  Laterality: N/A;  .  DILATION AND CURETTAGE OF UTERUS    . EXCISION OF SKIN TAG Left 08/29/2016   Procedure: EXCISION OF SKIN TAG left labia majora;  Surgeon: Salvadore Dom, MD;  Location: [facility];  Service: Gynecology;  Laterality: Left;  . EXPLORATORY LAPAROTOMY     X2 for pain: fibroid, enlarged oviduct  . RETINAL LASER PROCEDURE Right 08/2015  . SALPINGECTOMY  2007 or 2008   and fibroid removed  . TUBAL LIGATION     Social History   Social History  . Marital status: Married    Spouse name: N/A  . Number of children: 2   Occupational History  . Retired Therapist, sports   Social History Main Topics  . Smoking status: Never Smoker  . Smokeless tobacco: Never Used  . Alcohol use No  . Drug use: No  . Sexual activity: Yes    Birth control/ protection: Other-see comments, Post-menopausal     Comment: BTL   Current Outpatient Prescriptions on File Prior to Visit  Medication Sig Dispense Refill  . aspirin EC 81 MG tablet Take 1 tablet (81 mg total) by mouth daily. 90 tablet 3  . b complex vitamins tablet Take 1 tablet by mouth daily.    . Cholecalciferol (VITAMIN D) 2000 units CAPS Take 2,000 Units by mouth daily.    Marland Kitchen estradiol (ESTRACE VAGINAL) 0.1 MG/GM vaginal cream Use 1/2 gm intravaginally twice a week. 42.5 g 3  . ibuprofen (ADVIL,MOTRIN) 200 MG tablet Take 400 mg by mouth every 6 (six) hours as needed for headache or mild pain.    . metoprolol succinate (TOPROL-XL) 25 MG 24 hr tablet Take 1 tablet (25 mg total) by mouth daily. 90 tablet 3  . rosuvastatin (CRESTOR) 5 MG tablet Take 0.5 tablets (2.5 mg total) by mouth daily. Except Wednesdays, take a whole tablet. 55 tablet 3   No current facility-administered medications on file prior to visit.    Allergies  Allergen Reactions  . Lipitor [Atorvastatin] Other (See Comments)    Did not tolerate   Family History  Problem Relation Age of Onset  . Heart attack Father 24  . Myasthenia gravis Father   . Hypertension Father   . Hyperlipidemia Father    . Osteoporosis Father     due to steroids for Myasthenia Grvis  . Leukemia Mother 34  . Hyperlipidemia Mother   . Hypertension Mother   . Hypothyroidism Mother   . Prostate cancer Brother     in 44s  . Stroke Maternal Grandfather     in 57s  . Diabetes Neg Hx    PE: BP 112/60   Pulse 84   Ht 5\' 6"  (1.676 m)   Wt 144 lb (65.3 kg)   LMP 06/17/2009   SpO2 99%   BMI 23.24 kg/m  Wt Readings from Last 3 Encounters:  12/01/16 144 lb (65.3 kg)  11/08/16 144 lb (65.3 kg)  09/11/16 143 lb (64.9 kg)   Constitutional: normal weight, in NAD Eyes: PERRLA, EOMI, no exophthalmos ENT: moist mucous membranes, no thyromegaly, no cervical lymphadenopathy Cardiovascular: RRR, No MRG Respiratory: CTA B Gastrointestinal: abdomen soft, NT, ND, BS+ Musculoskeletal:  no deformities, strength intact in all 4;  Skin: moist, warm, no rashes Neurological: no tremor with outstretched hands, DTR normal in all 4  ASSESSMENT: 1. Multiple thyroid nodules  PLAN: 1. Multiple thyroid nodules - I reviewed the images of her thyroid ultrasound along with the patient. I pointed out that the dominant nodules are small, and they are not well delimited from the surrounding tissues, appearing more like inflammatory (pseudo-) nodules. Also, the nodules are: - not hypoechoic - without microcalcifications - without internal blood flow - more wide than tall Pt does not have a thyroid cancer family history or a personal history of RxTx to head/neck. All these would favor benignity.  - We discussed that a heterogeneous thyroid with inflammatory nodules can be a sign of Hashimoto thyroiditis, postviral thyroiditis, or a consequence of her previous thyrotoxic episode 20 years ago. Therefore, I would like to check her thyroid antibodies first to rule out Hashimoto thyroiditis. We discussed about the autoimmune nature of the disease and I explained that she has a higher risk for an autoimmune disease since her father has  myasthenia gravis. We also discussed about using selenium if her antibodies are elevated. I did explain that the studies have not been very conclusive with selenium, though. No other intervention is needed for Hashimoto's thyroiditis since her TFTs are normal.  - We discussed to repeat her ultrasound in a year to see if the nodules change characteristics - she agrees with this - she should let me know if she develops neck compression symptoms, in that case, we might need to do either lobectomy or thyroidectomy  Orders Placed This Encounter  Procedures  . Thyroid peroxidase antibody  . Thyroglobulin antibody   Office Visit on 12/01/2016  Component Date Value Ref Range Status  . Thyroperoxidase Ab SerPl-aCnc 12/02/2016 2  <9 IU/mL Final  . Thyroglobulin Ab 12/02/2016 <1  <2 IU/mL Final   Antithyroid antibodies are not elevated. Philemon Kingdom, MD PhD [facility]

## 2016-12-02 LAB — THYROGLOBULIN ANTIBODY: Thyroglobulin Ab: <1 IU/mL (ref <2)

## 2016-12-02 LAB — THYROID PEROXIDASE ANTIBODY: THYROID PEROXIDASE ANTIBODY: 2 [IU]/mL (ref <9)

## 2016-12-04 ENCOUNTER — Ambulatory Visit
Admission: RE | Admit: 2016-12-04 | Discharge: 2016-12-04 | Disposition: A | Discharge status: Home / Self Care | Payer: BLUE CROSS/BLUE SHIELD | Source: Ambulatory Visit | Attending: Internal Medicine | Admitting: Internal Medicine

## 2016-12-04 ENCOUNTER — Encounter: Payer: Self-pay | Admitting: Internal Medicine

## 2016-12-04 DIAGNOSIS — D1803 Hemangioma of intra-abdominal structures: Secondary | ICD-10-CM

## 2016-12-04 DIAGNOSIS — N281 Cyst of kidney, acquired: Secondary | ICD-10-CM

## 2016-12-05 ENCOUNTER — Encounter: Payer: Self-pay | Admitting: Internal Medicine

## 2016-12-05 DIAGNOSIS — D1803 Hemangioma of intra-abdominal structures: Secondary | ICD-10-CM | POA: Insufficient documentation

## 2016-12-05 DIAGNOSIS — N281 Cyst of kidney, acquired: Secondary | ICD-10-CM | POA: Insufficient documentation

## 2016-12-25 ENCOUNTER — Ambulatory Visit: Payer: BLUE CROSS/BLUE SHIELD | Admitting: Internal Medicine

## 2017-01-16 IMAGING — NM NM MYOCAR MULTI W/ SPECT
3 series · 18 of 18 positions shown · non-contrast
Comparison: none

[Series 1: stress_(id)_sa · 6.5mm · 6.51mm/px · 6 of 64 frames shown (1 of 2)]
[frame 6/64]
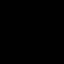
[frame 16/64]
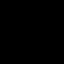
[frame 27/64]
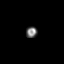
[frame 38/64]
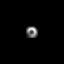
[frame 48/64]
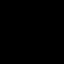
[frame 59/64]
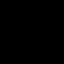

[Series 1: stress_(id)_sa · 6.5mm · 6.51mm/px · 6 of 512 frames shown (2 of 2)]
[frame 43/512]
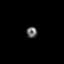
[frame 128/512]
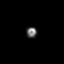
[frame 214/512]
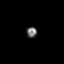
[frame 299/512]
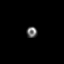
[frame 384/512]
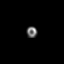
[frame 470/512]
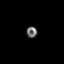

[Series 1: rest_(id)_sa · 6.5mm · 6.51mm/px · 6 of 64 frames shown]
[frame 6/64]
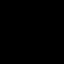
[frame 16/64]
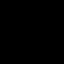
[frame 27/64]
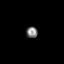
[frame 38/64]
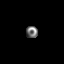
[frame 48/64]
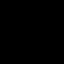
[frame 59/64]
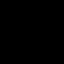

[18 of 18 positions shown; findings below may reference images not displayed]

Canned report from images found in remote index.

Refer to host system for actual result text.

## 2017-02-18 ENCOUNTER — Encounter: Payer: Self-pay | Admitting: Internal Medicine

## 2017-02-18 NOTE — Patient Instructions (Addendum)
Test(s) ordered today. Your results will be released to [place] (or called to you) after review, usually within 72hours after test completion. If any changes need to be made, you will be notified at that same time.  All other Health Maintenance issues reviewed.   All recommended immunizations and age-appropriate screenings are up-to-date or discussed.  No immunizations administered today.   Medications reviewed and updated.  No changes recommended at this time.   Please followup in one year for a physical   Health Maintenance, Female Adopting a healthy lifestyle and getting preventive care can go a long way to promote health and wellness. Talk with your health care provider about what schedule of regular examinations is right for you. This is a good chance for you to check in with your provider about disease prevention and staying healthy. In between checkups, there are plenty of things you can do on your own. Experts have done a lot of research about which lifestyle changes and preventive measures are most likely to keep you healthy. Ask your health care provider for more information. Weight and diet Eat a healthy diet  Be sure to include plenty of vegetables, fruits, low-fat dairy products, and lean protein.  Do not eat a lot of foods high in solid fats, added sugars, or salt.  Get regular exercise. This is one of the most important things you can do for your health.  Most adults should exercise for at least 150 minutes each week. The exercise should increase your heart rate and make you sweat (moderate-intensity exercise).  Most adults should also do strengthening exercises at least twice a week. This is in addition to the moderate-intensity exercise. Maintain a healthy weight  Body mass index (BMI) is a measurement that can be used to identify possible weight problems. It estimates body fat based on height and weight. Your health care provider can help determine your BMI and help  you achieve or maintain a healthy weight.  For females 6 years of age and older:  A BMI below 18.5 is considered underweight.  A BMI of 18.5 to 24.9 is normal.  A BMI of 25 to 29.9 is considered overweight.  A BMI of 30 and above is considered obese. Watch levels of cholesterol and blood lipids  You should start having your blood tested for lipids and cholesterol at 62 years of age, then have this test every 5 years.  You may need to have your cholesterol levels checked more often if:  Your lipid or cholesterol levels are high.  You are older than 62 years of age.  You are at high risk for heart disease. Cancer screening Lung Cancer  Lung cancer screening is recommended for adults 98-41 years old who are at high risk for lung cancer because of a history of smoking.  A yearly low-dose CT scan of the lungs is recommended for people who:  Currently smoke.  Have quit within the past 15 years.  Have at least a 30-pack-year history of smoking. A pack year is smoking an average of one pack of cigarettes a day for 1 year.  Yearly screening should continue until it has been 15 years since you quit.  Yearly screening should stop if you develop a health problem that would prevent you from having lung cancer treatment. Breast Cancer  Practice breast self-awareness. This means understanding how your breasts normally appear and feel.  It also means doing regular breast self-exams. Let your health care provider know about any changes, no  matter how small.  If you are in your 20s or 30s, you should have a clinical breast exam (CBE) by a health care provider every 1-3 years as part of a regular health exam.  If you are 45 or older, have a CBE every year. Also consider having a breast X-ray (mammogram) every year.  If you have a family history of breast cancer, talk to your health care provider about genetic screening.  If you are at high risk for breast cancer, talk to your health  care provider about having an MRI and a mammogram every year.  Breast cancer gene (BRCA) assessment is recommended for women who have family members with BRCA-related cancers. BRCA-related cancers include:  Breast.  Ovarian.  Tubal.  Peritoneal cancers.  Results of the assessment will determine the need for genetic counseling and BRCA1 and BRCA2 testing. Cervical Cancer  Your health care provider may recommend that you be screened regularly for cancer of the pelvic organs (ovaries, uterus, and vagina). This screening involves a pelvic examination, including checking for microscopic changes to the surface of your cervix (Pap test). You may be encouraged to have this screening done every 3 years, beginning at age 74.  For women ages 32-65, health care providers may recommend pelvic exams and Pap testing every 3 years, or they may recommend the Pap and pelvic exam, combined with testing for human papilloma virus (HPV), every 5 years. Some types of HPV increase your risk of cervical cancer. Testing for HPV may also be done on women of any age with unclear Pap test results.  Other health care providers may not recommend any screening for nonpregnant women who are considered low risk for pelvic cancer and who do not have symptoms. Ask your health care provider if a screening pelvic exam is right for you.  If you have had past treatment for cervical cancer or a condition that could lead to cancer, you need Pap tests and screening for cancer for at least 20 years after your treatment. If Pap tests have been discontinued, your risk factors (such as having a new sexual partner) need to be reassessed to determine if screening should resume. Some women have medical problems that increase the chance of getting cervical cancer. In these cases, your health care provider may recommend more frequent screening and Pap tests. Colorectal Cancer  This type of cancer can be detected and often prevented.  Routine  colorectal cancer screening usually begins at 62 years of age and continues through 62 years of age.  Your health care provider may recommend screening at an earlier age if you have risk factors for colon cancer.  Your health care provider may also recommend using home test kits to check for hidden blood in the stool.  A small camera at the end of a tube can be used to examine your colon directly (sigmoidoscopy or colonoscopy). This is done to check for the earliest forms of colorectal cancer.  Routine screening usually begins at age 36.  Direct examination of the colon should be repeated every 5-10 years through 62 years of age. However, you may need to be screened more often if early forms of precancerous polyps or small growths are found. Skin Cancer  Check your skin from head to toe regularly.  Tell your health care provider about any new moles or changes in moles, especially if there is a change in a mole's shape or color.  Also tell your health care provider if you have a mole  that is larger than the size of a pencil eraser.  Always use sunscreen. Apply sunscreen liberally and repeatedly throughout the day.  Protect yourself by wearing long sleeves, pants, a wide-brimmed hat, and sunglasses whenever you are outside. Heart disease, diabetes, and high blood pressure  High blood pressure causes heart disease and increases the risk of stroke. High blood pressure is more likely to develop in:  People who have blood pressure in the high end of the normal range (130-139/85-89 mm Hg).  People who are overweight or obese.  People who are African American.  If you are 66-36 years of age, have your blood pressure checked every 3-5 years. If you are 29 years of age or older, have your blood pressure checked every year. You should have your blood pressure measured twice-once when you are at a hospital or clinic, and once when you are not at a hospital or clinic. Record the average of the two  measurements. To check your blood pressure when you are not at a hospital or clinic, you can use:  An automated blood pressure machine at a pharmacy.  A home blood pressure monitor.  If you are between 65 years and 45 years old, ask your health care provider if you should take aspirin to prevent strokes.  Have regular diabetes screenings. This involves taking a blood sample to check your fasting blood sugar level.  If you are at a normal weight and have a low risk for diabetes, have this test once every three years after 62 years of age.  If you are overweight and have a high risk for diabetes, consider being tested at a younger age or more often. Preventing infection Hepatitis B  If you have a higher risk for hepatitis B, you should be screened for this virus. You are considered at high risk for hepatitis B if:  You were born in a country where hepatitis B is common. Ask your health care provider which countries are considered high risk.  Your parents were born in a high-risk country, and you have not been immunized against hepatitis B (hepatitis B vaccine).  You have HIV or AIDS.  You use needles to inject street drugs.  You live with someone who has hepatitis B.  You have had sex with someone who has hepatitis B.  You get hemodialysis treatment.  You take certain medicines for conditions, including cancer, organ transplantation, and autoimmune conditions. Hepatitis C  Blood testing is recommended for:  Everyone born from [birth year] through 1966.  Anyone with known risk factors for hepatitis C. Sexually transmitted infections (STIs)  You should be screened for sexually transmitted infections (STIs) including gonorrhea and chlamydia if:  You are sexually active and are younger than 62 years of age.  You are older than 62 years of age and your health care provider tells you that you are at risk for this type of infection.  Your sexual activity has changed since you were last  screened and you are at an increased risk for chlamydia or gonorrhea. Ask your health care provider if you are at risk.  If you do not have HIV, but are at risk, it may be recommended that you take a prescription medicine daily to prevent HIV infection. This is called pre-exposure prophylaxis (PrEP). You are considered at risk if:  You are sexually active and do not regularly use condoms or know the HIV status of your partner(s).  You take drugs by injection.  You are sexually active with  a partner who has HIV. Talk with your health care provider about whether you are at high risk of being infected with HIV. If you choose to begin PrEP, you should first be tested for HIV. You should then be tested every 3 months for as long as you are taking PrEP. Pregnancy  If you are premenopausal and you may become pregnant, ask your health care provider about preconception counseling.  If you may become pregnant, take 400 to 800 micrograms (mcg) of folic acid every day.  If you want to prevent pregnancy, talk to your health care provider about birth control (contraception). Osteoporosis and menopause  Osteoporosis is a disease in which the bones lose minerals and strength with aging. This can result in serious bone fractures. Your risk for osteoporosis can be identified using a bone density scan.  If you are 70 years of age or older, or if you are at risk for osteoporosis and fractures, ask your health care provider if you should be screened.  Ask your health care provider whether you should take a calcium or vitamin D supplement to lower your risk for osteoporosis.  Menopause may have certain physical symptoms and risks.  Hormone replacement therapy may reduce some of these symptoms and risks. Talk to your health care provider about whether hormone replacement therapy is right for you. Follow these instructions at home:  Schedule regular health, dental, and eye exams.  Stay current with your  immunizations.  Do not use any tobacco products including cigarettes, chewing tobacco, or electronic cigarettes.  If you are pregnant, do not drink alcohol.  If you are breastfeeding, limit how much and how often you drink alcohol.  Limit alcohol intake to no more than 1 drink per day for nonpregnant women. One drink equals 12 ounces of beer, 5 ounces of wine, or 1 ounces of hard liquor.  Do not use street drugs.  Do not share needles.  Ask your health care provider for help if you need support or information about quitting drugs.  Tell your health care provider if you often feel depressed.  Tell your health care provider if you have ever been abused or do not feel safe at home. This information is not intended to replace advice given to you by your health care provider. Make sure you discuss any questions you have with your health care provider. Document Released: 06/19/2011 Document Revised: 05/11/2016 Document Reviewed: 09/07/2015 Elsevier Interactive Patient Education  2017 [employer].

## 2017-02-18 NOTE — Assessment & Plan Note (Addendum)
Check tsh [place] in one year 11/2017 - can do through Dr Cruzita Lederer or myself

## 2017-02-18 NOTE — Progress Notes (Signed)
Subjective:    Patient ID: Tiffany Moran, female    DOB: [birth year], 61 y.o.   MRN: [id number]  HPI She is here for a physical exam.   She has a rough area on her nose.  It has been there for a while.  She wondered if she should see derm.  She recently took antibiotics for a right maxillary sinus.  She took antibiotics for 7 days and she thinks she has some residual infection.  Her nose runs and she feels she may have water in her right ear.  She does have allergies.  She uses saline nasal spray daily.   Growth under left arm.  She would like her moles looks at.  She has a place on her right anterior leg and a discoloration on her right hip.   Thyroid nodules.  She had a [place] and saw Dr Cruzita Lederer.  Her thyroid function is normal.  The nodules are small  - she needs a f/u US in one year.   The last 6 months her exercise has been less than usual.    Medications and allergies reviewed with patient and updated if appropriate.  Patient Active Problem List   Diagnosis Date Noted  . Liver hemangioma 12/05/2016  . Renal cyst, right 12/05/2016  . Goiter 11/22/2016  . Thyroid nodule 11/22/2016  . 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 Pes anserine bursitis 04/21/2014  . 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 Osteopenia 08/04/2011  . Unspecified vitamin D deficiency 08/04/2011  . History of mitral valve prolapse 07/26/2010  . 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 RAYNAUD'S SYNDROME 01/05/2010  . Hyperlipidemia 08/17/2009  . POLYARTHRITIS 08/17/2009    Current Outpatient Prescriptions on File Prior to Visit  Medication Sig Dispense Refill  . aspirin EC 81 MG tablet Take 1 tablet (81 mg total) by mouth daily. 90 tablet 3  . b complex vitamins tablet Take 1 tablet by mouth daily.    . Cholecalciferol (VITAMIN D) 2000 units CAPS Take 2,000 Units by mouth daily.    Marland Kitchen estradiol (ESTRACE VAGINAL) 0.1 MG/GM vaginal cream Use 1/2 gm intravaginally twice a week. 42.5 g 3  .  ibuprofen (ADVIL,MOTRIN) 200 MG tablet Take 400 mg by mouth every 6 (six) hours as needed for headache or mild pain.    . metoprolol succinate (TOPROL-XL) 25 MG 24 hr tablet Take 1 tablet (25 mg total) by mouth daily. 90 tablet 3  . rosuvastatin (CRESTOR) 5 MG tablet Take 0.5 tablets (2.5 mg total) by mouth daily. Except Wednesdays, take a whole tablet. 55 tablet 3   No current facility-administered medications on file prior to visit.     Past Medical History:  Diagnosis Date  . Cardiospasm 1983   felt may be related to MVP  . Hyperlipidemia   . Kidney cysts 11/2015   cyst on one of her kidneys, no treatment required  . Mitral valve prolapse   . PONV (postoperative nausea and vomiting)    teeth chipped  . Raynaud disease   . Retinal hole of right eye   . Shingles 2012   leg  . Tachycardia     Past Surgical History:  Procedure Laterality Date  . COLONOSCOPY  2007   [place], [place] ,[name]  . DILATATION & CURETTAGE/HYSTEROSCOPY WITH MYOSURE N/A 08/29/2016   Procedure: DILATATION & CURETTAGE/HYSTEROSCOPY;  Surgeon: Salvadore Dom, MD;  Location: [facility];  Service: Gynecology;  Laterality: N/A;  . DILATION AND CURETTAGE OF UTERUS    .  EXCISION OF SKIN TAG Left 08/29/2016   Procedure: EXCISION OF SKIN TAG left labia majora;  Surgeon: Salvadore Dom, MD;  Location: [facility];  Service: Gynecology;  Laterality: Left;  . EXPLORATORY LAPAROTOMY     X2 for pain: fibroid, enlarged oviduct  . RETINAL LASER PROCEDURE Right 08/2015  . SALPINGECTOMY  2007 or 2008   and fibroid removed  . TUBAL LIGATION      Social History   Social History  . Marital status: Married    Spouse name: N/A  . Number of children: N/A  . Years of education: N/A   Social History Main Topics  . Smoking status: Never Smoker  . Smokeless tobacco: Never Used  . Alcohol use No  . Drug use: No  . Sexual activity: Yes    Birth control/ protection: Other-see comments, Post-menopausal     Comment: BTL    Other Topics Concern  . None   Social History Narrative   Exercises regularly    Family History  Problem Relation Age of Onset  . Heart attack Father 67  . Myasthenia gravis Father   . Hypertension Father   . Hyperlipidemia Father   . Osteoporosis Father     due to steroids for Myasthenia Grvis  . Leukemia Mother 77  . Hyperlipidemia Mother   . Hypertension Mother   . Hypothyroidism Mother   . Prostate cancer Brother     in 72s  . Stroke Maternal Grandfather     in 39s  . Diabetes Neg Hx     Review of Systems  Constitutional: Negative for appetite change, chills, fatigue (lower energy) and fever.  HENT: Positive for postnasal drip. Negative for sinus pain and sinus pressure.   Eyes: Negative for visual disturbance.  Respiratory: Negative for cough, shortness of breath and wheezing.   Cardiovascular: Positive for palpitations (occasional). Negative for chest pain and leg swelling.  Gastrointestinal: Positive for constipation (mild, diet controlled). Negative for abdominal pain, blood in stool, diarrhea and nausea.       No gerd  Genitourinary: Negative for dysuria and hematuria.  Musculoskeletal: Negative for arthralgias and back pain.  Skin: Positive for color change. Negative for rash.  Neurological: Negative for dizziness, light-headedness and headaches.  Psychiatric/Behavioral: Negative for dysphoric mood. The patient is not nervous/anxious.        Objective:   Vitals:   02/19/17 0800  BP: 136/84  Pulse: 66  Resp: 16  Temp: 97.8 F (36.6 C)   Filed Weights   02/19/17 0800  Weight: 145 lb (65.8 kg)   Body mass index is 23.4 kg/m.  Wt Readings from Last 3 Encounters:  02/19/17 145 lb (65.8 kg)  12/01/16 144 lb (65.3 kg)  11/08/16 144 lb (65.3 kg)     Physical Exam Constitutional: She appears well-developed and well-nourished. No distress.  HENT:  Head: Normocephalic and atraumatic.  Right Ear: External ear normal. Normal ear canal and TM Left  Ear: External ear normal.  Normal ear canal and TM Mouth/Throat: Oropharynx is clear and moist.  Eyes: Conjunctivae and EOM are normal.  Neck: Neck supple. No tracheal deviation present. No thyromegaly present.  No carotid bruit  Cardiovascular: Normal rate, regular rhythm and normal heart sounds.   No murmur heard.  No edema. Pulmonary/Chest: Effort normal and breath sounds normal. No respiratory distress. She has no wheezes. She has no rales.  Breast: deferred to Gyn Abdominal: Soft. She exhibits no distension. There is no tenderness.  Lymphadenopathy: She has no  cervical adenopathy.  Skin: Skin is warm and dry. She is not diaphoretic. rough spot on nose - no erythema; slightly erythematous areas b/l hips - no tenderness; normal moles on back; residual bruise like appearance on right anterior lower leg Psychiatric: She has a normal mood and affect. Her behavior is normal.         Assessment & Plan:   Physical exam: Screening blood work  ordered Immunizations  Up to date  Colonoscopy  Up to date  Mammogram  Up to date  Gyn  Up to date  Dexa  - last done 2015, due  Eye exams  Up to date  EKG  - last done 04/2016 by Dr Harrington Challenger Exercise   exzercise Weight  Normal BMI Skin  - several concerns - rough area on nose - recommended derm eval - can evaluate other concerns as well Substance abuse  none  See Problem List for Assessment and Plan of chronic medical problems.

## 2017-02-18 NOTE — Assessment & Plan Note (Signed)
Controlled with metoprolol Following with cardiology Check labs

## 2017-02-18 NOTE — Assessment & Plan Note (Signed)
Check lipid panel  Continue daily statin Regular exercise and healthy diet encouraged  

## 2017-02-19 ENCOUNTER — Ambulatory Visit (INDEPENDENT_AMBULATORY_CARE_PROVIDER_SITE_OTHER): Payer: BLUE CROSS/BLUE SHIELD | Admitting: Internal Medicine

## 2017-02-19 ENCOUNTER — Encounter: Payer: Self-pay | Admitting: Internal Medicine

## 2017-02-19 VITALS — BP 136/84 | HR 66 | Temp 97.8°F | Resp 16 | Ht 66.0 in | Wt 145.0 lb

## 2017-02-19 DIAGNOSIS — E7800 Pure hypercholesterolemia, unspecified: Secondary | ICD-10-CM | POA: Diagnosis not present

## 2017-02-19 DIAGNOSIS — E041 Nontoxic single thyroid nodule: Secondary | ICD-10-CM | POA: Diagnosis not present

## 2017-02-19 DIAGNOSIS — Z0000 Encounter for general adult medical examination without abnormal findings: Secondary | ICD-10-CM

## 2017-02-19 DIAGNOSIS — I471 Supraventricular tachycardia: Secondary | ICD-10-CM | POA: Diagnosis not present

## 2017-02-19 DIAGNOSIS — M8580 Other specified disorders of bone density and structure, unspecified site: Secondary | ICD-10-CM

## 2017-02-19 NOTE — Assessment & Plan Note (Signed)
dexa up to date - due this year - has been ordered by gyn Increase exercise Taking vitamin D

## 2017-02-19 NOTE — Progress Notes (Signed)
Pre visit review using our clinic review tool, if applicable. No additional management support is needed unless otherwise documented below in the visit note. 

## 2017-02-21 ENCOUNTER — Other Ambulatory Visit (INDEPENDENT_AMBULATORY_CARE_PROVIDER_SITE_OTHER): Payer: BLUE CROSS/BLUE SHIELD

## 2017-02-21 ENCOUNTER — Encounter: Payer: Self-pay | Admitting: Internal Medicine

## 2017-02-21 DIAGNOSIS — I471 Supraventricular tachycardia: Secondary | ICD-10-CM | POA: Diagnosis not present

## 2017-02-21 DIAGNOSIS — Z0000 Encounter for general adult medical examination without abnormal findings: Secondary | ICD-10-CM

## 2017-02-21 DIAGNOSIS — E7800 Pure hypercholesterolemia, unspecified: Secondary | ICD-10-CM

## 2017-02-21 LAB — COMPREHENSIVE METABOLIC PANEL
ALBUMIN: 4.2 g/dL (ref 3.5–5.2)
ALK PHOS: 77 U/L (ref 39–117)
ALT: 20 U/L (ref 0–35)
AST: 17 U/L (ref 0–37)
BILIRUBIN TOTAL: 0.6 mg/dL (ref 0.2–1.2)
BUN: 11 mg/dL (ref 6–23)
CALCIUM: 9.8 mg/dL (ref 8.4–10.5)
CO2: 30 mEq/L (ref 19–32)
Chloride: 106 mEq/L (ref 96–112)
Creatinine, Ser: 0.82 mg/dL (ref 0.40–1.20)
GFR: 75.24 mL/min (ref >60.00)
GLUCOSE: 80 mg/dL (ref 70–99)
Potassium: 4.4 mEq/L (ref 3.5–5.1)
Sodium: 142 mEq/L (ref 135–145)
TOTAL PROTEIN: 6.8 g/dL (ref 6.0–8.3)

## 2017-02-21 LAB — CBC WITH DIFFERENTIAL/PLATELET
BASOS ABS: 0 10*3/uL (ref 0.0–0.1)
Basophils Relative: 0.6 % (ref 0.0–3.0)
EOS PCT: 2.6 % (ref 0.0–5.0)
Eosinophils Absolute: 0.1 10*3/uL (ref 0.0–0.7)
HCT: 41 % (ref 36.0–46.0)
HEMOGLOBIN: 13.9 g/dL (ref 12.0–15.0)
LYMPHS ABS: 1.4 10*3/uL (ref 0.7–4.0)
Lymphocytes Relative: 36.3 % (ref 12.0–46.0)
MCHC: 34 g/dL (ref 30.0–36.0)
MCV: 88.6 fl (ref 78.0–100.0)
MONO ABS: 0.2 10*3/uL (ref 0.1–1.0)
Monocytes Relative: 5.7 % (ref 3.0–12.0)
NEUTROS PCT: 54.8 % (ref 43.0–77.0)
Neutro Abs: 2.1 10*3/uL (ref 1.4–7.7)
Platelets: 168 10*3/uL (ref 150.0–400.0)
RBC: 4.62 Mil/uL (ref 3.87–5.11)
RDW: 13.9 % (ref 11.5–15.5)
WBC: 3.7 10*3/uL — AB (ref 4.0–10.5)

## 2017-02-21 LAB — LIPID PANEL
Cholesterol: 185 mg/dL (ref 0–200)
HDL: 65.1 mg/dL (ref >39.00)
LDL Cholesterol: 104 mg/dL — ABNORMAL HIGH (ref 0–99)
NonHDL: 120.32
TRIGLYCERIDES: 80 mg/dL (ref 0.0–149.0)
Total CHOL/HDL Ratio: 3
VLDL: 16 mg/dL (ref 0.0–40.0)

## 2017-02-21 LAB — TSH: TSH: 2.32 u[IU]/mL (ref 0.35–4.50)

## 2017-02-25 ENCOUNTER — Other Ambulatory Visit: Payer: Self-pay | Admitting: Internal Medicine

## 2017-03-21 ENCOUNTER — Telehealth: Payer: Self-pay | Admitting: Internal Medicine

## 2017-03-21 NOTE — Telephone Encounter (Signed)
Tiffany Moran is calling because she is having more palpitations and want to know should she adjust her medications . Please call  Thanks

## 2017-03-21 NOTE — Telephone Encounter (Signed)
Agree with recommendations  Can take extra b blocker  Stay hydrated

## 2017-03-21 NOTE — Telephone Encounter (Signed)
Patient reports more frequent palpitations last couple months.  Last few days they have been very bothersome.  Usually coughing or drinking cold water can get them to stop.  Last night very bothersome at rest.  She wonders if hormones are playing a part.   Advised she could take extra 1/2 toprol for now and that I will forward to dr Harrington Challenger for review/reocmmendations and call her back.   She is very concerned and becomes more anxious when they occur

## 2017-03-22 NOTE — Telephone Encounter (Signed)
Left detailed voice message on home number (per DPR) of Dr. Harrington Challenger notes below.

## 2017-03-26 ENCOUNTER — Telehealth: Payer: Self-pay | Admitting: Internal Medicine

## 2017-03-26 MED ORDER — ALPRAZOLAM 0.5 MG PO TABS: 0.5000 mg | ORAL_TABLET | Freq: Three times a day (TID) | ORAL | 0 refills | Status: DC | PRN

## 2017-03-26 NOTE — Telephone Encounter (Signed)
Please advise, pt was last seen on 02/19/17. Not on current med list.

## 2017-03-26 NOTE — Telephone Encounter (Signed)
Ok printed

## 2017-03-26 NOTE — Telephone Encounter (Signed)
Pt states she would like a refill of Xanax, she only takes it when she travels. Please advise.

## 2017-03-26 NOTE — Telephone Encounter (Signed)
RX faxed to [facility] 

## 2017-03-29 DIAGNOSIS — H00024 Hordeolum internum left upper eyelid: Secondary | ICD-10-CM | POA: Diagnosis not present

## 2017-04-25 NOTE — Progress Notes (Signed)
Cardiology Office Note   Date:  04/27/2017   ID:  Tiffany Moran, DOB September 05, 1956, MRN [id number]  PCP:  Binnie Rail, MD  Cardiologist:   Dorris Carnes, MD   F/U of palpitations and mild CAD     History of Present Illness: Tiffany Moran is a 62 y.o. female with a history of palpitations  Also a history of abnormal myoview  Cardiac CT showed mild nonobstructive CAD  I saw her in clinic 1 year ago    Has spells of palpitations  Sl dizziness  Has noticed more   Breathing gets short with incline  Memory issues  Finding words attimes  Wonders if Crestor   Outpatient Medications Prior to Visit  Medication Sig Dispense Refill  . ALPRAZolam (XANAX) 0.5 MG tablet Take 1 tablet (0.5 mg total) by mouth every 8 (eight) hours as needed for anxiety. 15 tablet 0  . aspirin EC 81 MG tablet Take 1 tablet (81 mg total) by mouth daily. 90 tablet 3  . Cholecalciferol (VITAMIN D) 2000 units CAPS Take 2,000 Units by mouth daily.    Marland Kitchen estradiol (ESTRACE VAGINAL) 0.1 MG/GM vaginal cream Use 1/2 gm intravaginally twice a week. 42.5 g 3  . ibuprofen (ADVIL,MOTRIN) 200 MG tablet Take 400 mg by mouth every 6 (six) hours as needed for headache or mild pain.    . metoprolol succinate (TOPROL-XL) 25 MG 24 hr tablet TAKE 1 TABLET (25 MG TOTAL) BY MOUTH DAILY. 90 tablet 3  . rosuvastatin (CRESTOR) 5 MG tablet Take 0.5 tablets (2.5 mg total) by mouth daily. Except Wednesdays, take a whole tablet. 55 tablet 3  . b complex vitamins tablet Take 1 tablet by mouth daily.     No facility-administered medications prior to visit.      Allergies:   Lipitor [atorvastatin]   Past Medical History:  Diagnosis Date  . Cardiospasm 1983   felt may be related to MVP  . Hyperlipidemia   . Kidney cysts 11/2015   cyst on one of her kidneys, no treatment required  . Mitral valve prolapse   . PONV (postoperative nausea and vomiting)    teeth chipped  . Raynaud disease   . Retinal hole of right eye   . Shingles 2012   leg    . Tachycardia     Past Surgical History:  Procedure Laterality Date  . COLONOSCOPY  2007   [name], [name] ,[name]  . DILATATION & CURETTAGE/HYSTEROSCOPY WITH MYOSURE N/A 08/29/2016   Procedure: DILATATION & CURETTAGE/HYSTEROSCOPY;  Surgeon: Salvadore Dom, MD;  Location: [name];  Service: Gynecology;  Laterality: N/A;  . DILATION AND CURETTAGE OF UTERUS    . EXCISION OF SKIN TAG Left 08/29/2016   Procedure: EXCISION OF SKIN TAG left labia majora;  Surgeon: Salvadore Dom, MD;  Location: [name];  Service: Gynecology;  Laterality: Left;  . EXPLORATORY LAPAROTOMY     X2 for pain: fibroid, enlarged oviduct  . RETINAL LASER PROCEDURE Right 08/2015  . SALPINGECTOMY  2007 or 2008   and fibroid removed  . TUBAL LIGATION       Social History:  The patient  reports that she has never smoked. She has never used smokeless tobacco. She reports that she does not drink alcohol or use drugs.   Family History:  The patient's family history includes Heart attack (age of onset: 28) in her father; Hyperlipidemia in her father and mother; Hypertension in her father and mother; Hypothyroidism in her mother; Leukemia (age of  onset: 27) in her mother; Myasthenia gravis in her father; Osteoporosis in her father; Prostate cancer in her brother; Stroke in her maternal grandfather.    ROS:  Please see the history of present illness. All other systems are reviewed and  Negative to the above problem except as noted.    PHYSICAL EXAM: VS:  BP 100/62 (BP Location: Left Arm, Patient Position: Sitting, Cuff Size: Normal)   Pulse 67   Ht 5\' 6"  (1.676 m)   Wt 144 lb 12.8 oz (65.7 kg)   LMP 06/17/2009   SpO2 95%   BMI 23.37 kg/m   GEN: Well nourished, well developed, in no acute distress  HEENT: normal  Neck: no JVD, carotid bruits, or masses Cardiac: RRR; no murmurs, rubs, or gallops,no edema  Respiratory:  clear to auscultation bilaterally, normal work of breathing GI: soft, nontender,  nondistended, + BS  No hepatomegaly  MS: no deformity Moving all extremities   Skin: warm and dry, no rash Neuro:  Strength and sensation are intact Psych: euthymic mood, full affect   EKG:  EKG is ordered today.  SR 67 bpm  Nonspecific ST T wave changes  Septal infarct  Unchanged    Lipid Panel    Component Value Date/Time   CHOL 185 02/21/2017 0726   TRIG 80.0 02/21/2017 0726   HDL 65.10 02/21/2017 0726   CHOLHDL 3 02/21/2017 0726   VLDL 16.0 02/21/2017 0726   LDLCALC 104 (H) 02/21/2017 0726      Wt Readings from Last 3 Encounters:  04/27/17 144 lb 12.8 oz (65.7 kg)  02/19/17 145 lb (65.8 kg)  12/01/16 144 lb (65.3 kg)      ASSESSMENT AND PLAN:  Pt very anxious overall  1  CAD  Very mild on CT  No symptoms of angina  Encouraged to do interval training   2.  HL Will recomm she increase crestor to 5  F/U lipidsd in 3 months    3  Hx SVT   I am not convinced currentspells are SVT  Will set up for event montiro  4  L Leg swelling  Slight  ! Varicose vein at knee area  ? Anatomic issure with drainage in thigh area.  Not that symptoamtic    Nothing to do   Would  recomm support socks when she gardens  Elevate legs on days uncomfortable (Lounge Doctor pillow)  If worsens or bothered the referral to [facility]  5  Memory  I do not think related to Crestor but could stop and see how feels  Consider referral to neuro  6  Vaginal driness  Will checkwith pharmacy  Pt does not appear to be that symptomatic Encouraged her to increase activity  Interval training    F/U in1 eyr    Signed, Dorris Carnes, MD  04/27/2017 8:[address], [name], [name]  [address] Phone: [phone]; Fax: [phone]

## 2017-04-27 ENCOUNTER — Encounter: Payer: Self-pay | Admitting: Internal Medicine

## 2017-04-27 ENCOUNTER — Ambulatory Visit (INDEPENDENT_AMBULATORY_CARE_PROVIDER_SITE_OTHER): Payer: BLUE CROSS/BLUE SHIELD | Admitting: Internal Medicine

## 2017-04-27 VITALS — BP 100/62 | HR 67 | Ht 66.0 in | Wt 144.8 lb

## 2017-04-27 DIAGNOSIS — E785 Hyperlipidemia, unspecified: Secondary | ICD-10-CM | POA: Diagnosis not present

## 2017-04-27 DIAGNOSIS — R002 Palpitations: Secondary | ICD-10-CM | POA: Diagnosis not present

## 2017-04-27 DIAGNOSIS — I2510 Atherosclerotic heart disease of native coronary artery without angina pectoris: Secondary | ICD-10-CM

## 2017-04-27 MED ORDER — ROSUVASTATIN CALCIUM 5 MG PO TABS: 5.0000 mg | ORAL_TABLET | Freq: Every day | ORAL | 3 refills | Status: DC

## 2017-04-27 NOTE — Patient Instructions (Signed)
Your physician has recommended you make the following change in your medication:  1,) increase Crestor to 5 mg every day  Your physician has recommended that you wear an event monitor. Event monitors are medical devices that record the heart's electrical activity. Doctors most often [place] these monitors to diagnose arrhythmias. Arrhythmias are problems with the speed or rhythm of the heartbeat. The monitor is a small, portable device. You can wear one while you do your normal daily activities. This is usually used to diagnose what is causing palpitations/syncope (passing out).  Your physician recommends that you return for lab work in: 3 months (LIPIDS)  Your physician wants you to follow-up in: [place].  You will receive a reminder letter in the mail two months in advance. If you don't receive a letter, please call our office to schedule the follow-up appointment.

## 2017-05-22 ENCOUNTER — Ambulatory Visit (INDEPENDENT_AMBULATORY_CARE_PROVIDER_SITE_OTHER): Payer: BLUE CROSS/BLUE SHIELD

## 2017-05-22 DIAGNOSIS — R002 Palpitations: Secondary | ICD-10-CM

## 2017-05-25 DIAGNOSIS — M5137 Other intervertebral disc degeneration, lumbosacral region: Secondary | ICD-10-CM | POA: Diagnosis not present

## 2017-05-25 DIAGNOSIS — M545 Low back pain: Secondary | ICD-10-CM | POA: Diagnosis not present

## 2017-06-04 ENCOUNTER — Encounter: Payer: Self-pay | Admitting: Nurse Practitioner

## 2017-06-04 ENCOUNTER — Ambulatory Visit (INDEPENDENT_AMBULATORY_CARE_PROVIDER_SITE_OTHER): Payer: BLUE CROSS/BLUE SHIELD | Admitting: Nurse Practitioner

## 2017-06-04 ENCOUNTER — Other Ambulatory Visit (HOSPITAL_COMMUNITY)
Admission: RE | Admit: 2017-06-04 | Discharge: 2017-06-04 | Disposition: A | Discharge status: Home / Self Care | Payer: BLUE CROSS/BLUE SHIELD | Source: Ambulatory Visit | Attending: Nurse Practitioner | Admitting: Nurse Practitioner

## 2017-06-04 VITALS — BP 120/84 | HR 68 | Ht 65.5 in | Wt 143.0 lb

## 2017-06-04 DIAGNOSIS — E559 Vitamin D deficiency, unspecified: Secondary | ICD-10-CM

## 2017-06-04 DIAGNOSIS — Z01419 Encounter for gynecological examination (general) (routine) without abnormal findings: Secondary | ICD-10-CM

## 2017-06-04 DIAGNOSIS — Z0000 Encounter for general adult medical examination without abnormal findings: Secondary | ICD-10-CM | POA: Diagnosis not present

## 2017-06-04 NOTE — Progress Notes (Signed)
61 y.o. G77P2002 Married  Caucasian Fe here for annual exam. After PUS with possible polyp.  Then had D&C on 08/29/16 and no bleeding since.  Still some pain with SA on the left.  She is now being worked up by cardiologist with heart monitoring.  She had normal Echo with mild mitral valve prolapse.  She has a high LDL and Crestor med's have been changed to 5 mg daily.  She was on Premarin vaginal but had been inconsistent in use -now off until cardio evaluation.  Father passed 3 weeks ago.  He lived in [place].  Patient's last menstrual period was 06/17/2009 (approximate).          Sexually active: Yes.    The current method of family planning is post menopause Exercising: Yes.    Home exercise routine includes treadmill and walking 3 hrs per week. Smoker:  no  Health Maintenance: Pap:  05/29/16 ASCUS with negative HR HPV           05/24/15 - negative           05/19/14 normal with negative HR HPV History of Abnormal Pap: no MMG:  06/14/16 3D BRI: c Self Breast exams: yes Colonoscopy:  11/2015 normal - repeat in 10 yrs BMD:   06/02/14 spine -1.2; right hip -1.8; left hip -2.1 TDaP:  04/03/11 Shingles: declines Pneumonia: declines Hep C and HIV: 05/29/16 Labs: we check Vit D   reports that she has never smoked. She has never used smokeless tobacco. She reports that she does not drink alcohol or use drugs.  Past Medical History:  Diagnosis Date  . Cardiospasm 1983   felt may be related to MVP  . Hyperlipidemia   . Kidney cysts 11/2015   cyst on one of her kidneys, no treatment required  . Mitral valve prolapse   . PONV (postoperative nausea and vomiting)    teeth chipped  . Raynaud disease   . Retinal hole of right eye   . Shingles 2012   leg  . Tachycardia     Past Surgical History:  Procedure Laterality Date  . COLONOSCOPY  2007   [place], [place] ,[name]  . DILATATION & CURETTAGE/HYSTEROSCOPY WITH MYOSURE N/A 08/29/2016   Procedure: DILATATION & CURETTAGE/HYSTEROSCOPY;   Surgeon: Salvadore Dom, MD;  Location: [facility];  Service: Gynecology;  Laterality: N/A;  . DILATION AND CURETTAGE OF UTERUS    . EXCISION OF SKIN TAG Left 08/29/2016   Procedure: EXCISION OF SKIN TAG left labia majora;  Surgeon: Salvadore Dom, MD;  Location: [facility];  Service: Gynecology;  Laterality: Left;  . EXPLORATORY LAPAROTOMY     X2 for pain: fibroid, enlarged oviduct  . RETINAL LASER PROCEDURE Right 08/2015  . SALPINGECTOMY  2007 or 2008   and fibroid removed  . TUBAL LIGATION      Current Outpatient Prescriptions  Medication Sig Dispense Refill  . ALPRAZolam (XANAX) 0.5 MG tablet Take 1 tablet (0.5 mg total) by mouth every 8 (eight) hours as needed for anxiety. 15 tablet 0  . aspirin EC 81 MG tablet Take 1 tablet (81 mg total) by mouth daily. 90 tablet 3  . Cholecalciferol (VITAMIN D) 2000 units CAPS Take 2,000 Units by mouth daily.    Marland Kitchen ibuprofen (ADVIL,MOTRIN) 200 MG tablet Take 400 mg by mouth every 6 (six) hours as needed for headache or mild pain.    . metoprolol succinate (TOPROL-XL) 25 MG 24 hr tablet TAKE 1 TABLET (25 MG TOTAL) BY  MOUTH DAILY. 90 tablet 3  . rosuvastatin (CRESTOR) 5 MG tablet Take 1 tablet (5 mg total) by mouth daily at 6 PM. 90 tablet 3  . estradiol (ESTRACE VAGINAL) 0.1 MG/GM vaginal cream Use 1/2 gm intravaginally twice a week. (Patient not taking: Reported on 06/04/2017) 42.5 g 3   No current facility-administered medications for this visit.     Family History  Problem Relation Age of Onset  . Heart attack Father 80  . Myasthenia gravis Father   . Hypertension Father   . Hyperlipidemia Father   . Osteoporosis Father        due to steroids for Myasthenia Grvis  . Leukemia Mother 80  . Hyperlipidemia Mother   . Hypertension Mother   . Hypothyroidism Mother   . Prostate cancer Brother        in 46s  . Stroke Maternal Grandfather        in 67s  . Diabetes Neg Hx     ROS:  Pertinent items are noted in HPI.  Otherwise, a comprehensive  ROS was negative.  Exam:   BP 120/84 (BP Location: Right Arm, Patient Position: Sitting, Cuff Size: Normal)   Pulse 68   Ht 5' 5.5" (1.664 m)   Wt 143 lb (64.9 kg)   LMP 06/17/2009 (Approximate)   BMI 23.43 kg/m  Height: 5' 5.5" (166.4 cm) Ht Readings from Last 3 Encounters:  06/04/17 5' 5.5" (1.664 m)  04/27/17 5\' 6"  (1.676 m)  02/19/17 5\' 6"  (1.676 m)    General appearance: alert, cooperative and appears stated age Head: Normocephalic, without obvious abnormality, atraumatic Neck: no adenopathy, supple, symmetrical, trachea midline and thyroid normal to inspection and palpation Lungs: clear to auscultation bilaterally Breasts: normal appearance, no masses or tenderness Heart: regular rate and rhythm,  Holter monitor in place. Abdomen: soft, non-tender; no masses,  no organomegaly Extremities: extremities normal, atraumatic, no cyanosis or edema Skin: Skin color, texture, turgor normal. No rashes or lesions Lymph nodes: Cervical, supraclavicular, and axillary nodes normal. No abnormal inguinal nodes palpated Neurologic: Grossly normal   Pelvic: External genitalia:  no lesions              Urethra:  normal appearing urethra with no masses, tenderness or lesions              Bartholin's and Skene's: normal                 Vagina: normal appearing vagina with normal color and discharge, no lesions              Cervix: anteverted              Pap taken: Yes.  per request Bimanual Exam:  Uterus:  normal size, contour, position, consistency, mobility, non-tender              Adnexa: no mass, fullness, tenderness               Rectovaginal: Confirms               Anus:  normal sphincter tone, no lesions  Chaperone present: yes  A:  Well Woman with normal exam  Postmenopausal    Atrophic vaginitis  History of MVP  History of PMB with EMB - benign 08/29/16  Currently evaluation with cardiology  Grief reaction to loss of father 3 weeks ago  Update immunization  P:   Reviewed  health and wellness pertinent to exam  Pap smear: yes  Mammogram is  due 05/2017  Did not refill on vaginal estrogen as she is off this due to hold from cardiologist  Counseled with risk of DVT, CVA, and cancer  Recheck Vit D and will follow  - currently on OTC dosing  TDaP is given today  Counseled on breast self exam, mammography screening, adequate intake of calcium and vitamin D, diet and exercise return annually or prn Condolence for fathers passing.  An After Visit Summary was printed and given to the patient.

## 2017-06-04 NOTE — Patient Instructions (Signed)

## 2017-06-05 LAB — VITAMIN D 25 HYDROXY (VIT D DEFICIENCY, FRACTURES): Vit D, 25-Hydroxy: 37.6 ng/mL (ref 30.0–100.0)

## 2017-06-06 LAB — CYTOLOGY - PAP
Diagnosis: NEGATIVE
HPV: NOT DETECTED

## 2017-06-07 DIAGNOSIS — H04123 Dry eye syndrome of bilateral lacrimal glands: Secondary | ICD-10-CM | POA: Diagnosis not present

## 2017-06-07 DIAGNOSIS — H35371 Puckering of macula, right eye: Secondary | ICD-10-CM | POA: Diagnosis not present

## 2017-06-07 DIAGNOSIS — H2513 Age-related nuclear cataract, bilateral: Secondary | ICD-10-CM | POA: Diagnosis not present

## 2017-06-07 DIAGNOSIS — H40013 Open angle with borderline findings, low risk, bilateral: Secondary | ICD-10-CM | POA: Diagnosis not present

## 2017-06-07 NOTE — Progress Notes (Signed)
Reviewed personally.  M. Suzanne Micheline Markes, MD.  

## 2017-06-15 DIAGNOSIS — Z1231 Encounter for screening mammogram for malignant neoplasm of breast: Secondary | ICD-10-CM | POA: Diagnosis not present

## 2017-06-15 DIAGNOSIS — M8589 Other specified disorders of bone density and structure, multiple sites: Secondary | ICD-10-CM | POA: Diagnosis not present

## 2017-06-15 LAB — HM DEXA SCAN: HM Dexa Scan: -2.1

## 2017-06-22 ENCOUNTER — Telehealth: Payer: Self-pay | Admitting: Nurse Practitioner

## 2017-06-22 ENCOUNTER — Encounter: Payer: Self-pay | Admitting: Internal Medicine

## 2017-06-22 NOTE — Telephone Encounter (Signed)
Called [facility] for report to be faxed. Report faxed over and given to Dr. Sabra Heck to review and advise. Call to patient and she is aware she will be called with results once Dr. Sabra Heck has a chance to review. Patient aware that will likely not be today.

## 2017-06-26 ENCOUNTER — Encounter: Payer: Self-pay | Admitting: Nurse Practitioner

## 2017-06-28 NOTE — Telephone Encounter (Signed)
Patient calling for bone density results. States it shows in [facility] but there are no results attached. Please advise.

## 2017-06-29 NOTE — Telephone Encounter (Signed)
Results to [facility] for review and advise.

## 2017-06-29 NOTE — Telephone Encounter (Signed)
Message left to return call to Triage RN at [phone] to review BMD results.

## 2017-07-02 NOTE — Telephone Encounter (Signed)
Spoke with patient. BMD results reviewed. Advised per Dr.Miller BMD is stable in femurs. Her spine has decreased some, but is still in osteopenia range. No treatment needed. Recheck in 3 years. Patient verbalizes understanding. Paper report to scan.  Routing to provider for final review. Patient agreeable to disposition. Will close encounter.

## 2017-07-30 ENCOUNTER — Other Ambulatory Visit: Payer: BLUE CROSS/BLUE SHIELD

## 2017-07-31 ENCOUNTER — Other Ambulatory Visit: Payer: BLUE CROSS/BLUE SHIELD

## 2017-07-31 DIAGNOSIS — R002 Palpitations: Secondary | ICD-10-CM

## 2017-07-31 DIAGNOSIS — E785 Hyperlipidemia, unspecified: Secondary | ICD-10-CM | POA: Diagnosis not present

## 2017-07-31 LAB — LIPID PANEL
CHOLESTEROL TOTAL: 191 mg/dL (ref 100–199)
Chol/HDL Ratio: 2.5 ratio (ref 0.0–4.4)
HDL: 76 mg/dL (ref >39)
LDL Calculated: 97 mg/dL (ref 0–99)
Triglycerides: 88 mg/dL (ref 0–149)
VLDL CHOLESTEROL CAL: 18 mg/dL (ref 5–40)

## 2017-08-08 ENCOUNTER — Telehealth: Payer: Self-pay | Admitting: Internal Medicine

## 2017-08-08 DIAGNOSIS — E785 Hyperlipidemia, unspecified: Secondary | ICD-10-CM

## 2017-08-08 MED ORDER — ROSUVASTATIN CALCIUM 10 MG PO TABS: 10.0000 mg | ORAL_TABLET | Freq: Every day | ORAL | 3 refills | Status: DC

## 2017-08-08 NOTE — Telephone Encounter (Signed)
New message    Pt called and states that she got her lab results but no comment from Dr. Harrington Challenger and she requests a call back.

## 2017-08-08 NOTE — Telephone Encounter (Signed)
Spoke with pt and went over lab results and recommendations per Dr. Harrington Challenger.  Scheduled pt for repeat labs on 10/10.  Pt verbalized understanding and was appreciative for call.

## 2017-09-18 ENCOUNTER — Telehealth: Payer: Self-pay | Admitting: Internal Medicine

## 2017-09-18 NOTE — Telephone Encounter (Signed)
Pt thinks the increased dose of Crestor (10 mg daily since Aug) is causing GI upset--slightly loose stool, abdominal pressure and gas pains.   She read that it is a rare side effect.   Tried pepto bismol a couple days which helped a little.  For the last 3 days she ate only oatmeal and did not have symptoms.  I am forwarding to pharmacy for input on side effects of Crestor.   I told her if it continues, after we check lipids on 10/10 she could stop for a week to see if symptoms improve.

## 2017-09-18 NOTE — Telephone Encounter (Signed)
Agree with your recommendation - symptoms may be related to Crestor however it would be rare. Would see if she is willing to continue on Crestor until lipids are drawn and ok with stopping for 1 week after to see if symptoms improve.

## 2017-09-18 NOTE — Telephone Encounter (Signed)
New Message  Pt call requesting to speak with RN about lab work and to asked questions about Crestor. Please call back to discuss

## 2017-09-20 NOTE — Telephone Encounter (Signed)
Reviewed   Would get fasting liipids  Almost 2 months . Can stop after for 1 week and see if symptoms resolve

## 2017-09-25 ENCOUNTER — Other Ambulatory Visit: Payer: BLUE CROSS/BLUE SHIELD

## 2017-09-25 DIAGNOSIS — E785 Hyperlipidemia, unspecified: Secondary | ICD-10-CM

## 2017-09-25 LAB — HEPATIC FUNCTION PANEL
ALBUMIN: 4.6 g/dL (ref 3.6–4.8)
ALK PHOS: 79 IU/L (ref 39–117)
ALT: 19 IU/L (ref 0–32)
AST: 18 IU/L (ref 0–40)
BILIRUBIN TOTAL: 0.6 mg/dL (ref 0.0–1.2)
BILIRUBIN, DIRECT: 0.15 mg/dL (ref 0.00–0.40)
Total Protein: 6.9 g/dL (ref 6.0–8.5)

## 2017-09-25 LAB — LIPID PANEL
CHOLESTEROL TOTAL: 168 mg/dL (ref 100–199)
Chol/HDL Ratio: 2.3 ratio (ref 0.0–4.4)
HDL: 72 mg/dL (ref >39)
LDL Calculated: 79 mg/dL (ref 0–99)
Triglycerides: 84 mg/dL (ref 0–149)
VLDL CHOLESTEROL CAL: 17 mg/dL (ref 5–40)

## 2017-09-26 ENCOUNTER — Encounter: Payer: Self-pay | Admitting: Internal Medicine

## 2017-09-26 ENCOUNTER — Other Ambulatory Visit: Payer: BLUE CROSS/BLUE SHIELD

## 2017-09-26 ENCOUNTER — Ambulatory Visit (INDEPENDENT_AMBULATORY_CARE_PROVIDER_SITE_OTHER): Payer: BLUE CROSS/BLUE SHIELD | Admitting: Internal Medicine

## 2017-09-26 VITALS — BP 144/82 | HR 72 | Temp 97.9°F | Resp 16 | Wt 143.0 lb

## 2017-09-26 DIAGNOSIS — R1011 Right upper quadrant pain: Secondary | ICD-10-CM | POA: Diagnosis not present

## 2017-09-26 DIAGNOSIS — F419 Anxiety disorder, unspecified: Secondary | ICD-10-CM | POA: Diagnosis not present

## 2017-09-26 MED ORDER — ALPRAZOLAM 0.5 MG PO TABS: 0.5000 mg | ORAL_TABLET | Freq: Three times a day (TID) | ORAL | 0 refills | Status: DC | PRN

## 2017-09-26 NOTE — Assessment & Plan Note (Signed)
Intermittent RUQ radiating into back - not associated with fatty/rich foods, has occasional GERD  ? atypical GERD ? Related to crestor Abd [place] 11/2016 - no GB stones, but hemangioma - will recheck liver US rec decreasing crestor to 5 mg for one month to see if symptoms improve - if not will start zantac May need GI referral

## 2017-09-26 NOTE — Assessment & Plan Note (Signed)
With flying - about to fly Will refill

## 2017-09-26 NOTE — Progress Notes (Signed)
Subjective:    Patient ID: Tiffany Moran, female    DOB: 1956-01-16, 61 y.o.   MRN: [id number]  HPI She is here for an acute visit.   Abdominal pain:  She has has had intermittent right upper quedrant pain, but it would occur only a couple of times a year.  It was thought to be trapped gas.  She has had this pain much more regularly over the past month.  It is relieved by burping or passing gas.  It comes and goes.  She has been trying to watch what she eats - she thinks the kale salad or other high fiber foods may make it worse.  She needs to get the fiber it because she has a tortuous bowel and has constipation.   The pain is in the RUQ and radiates to the back.   She had it the other day after eating an apple.    She thought it was the crestor - it was just increased.  She was on 5 mg prior to that.    Anxiety with flying:  She takes xanax as needed for flying or other circumstances.  She will be flying soon and would like a refill.    Medications and allergies reviewed with patient and updated if appropriate.  Patient Active Problem List   Diagnosis Date Noted  . Liver hemangioma 12/05/2016  . Renal cyst, right 12/05/2016  . Thyroid nodule 11/22/2016  . 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 Pes anserine bursitis 04/21/2014  . 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 Osteopenia 08/04/2011  . Unspecified vitamin D deficiency 08/04/2011  . History of mitral valve prolapse 07/26/2010  . 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 RAYNAUD'S SYNDROME 01/05/2010  . Hyperlipidemia 08/17/2009  . POLYARTHRITIS 08/17/2009    Current Outpatient Prescriptions on File Prior to Visit  Medication Sig Dispense Refill  . ALPRAZolam (XANAX) 0.5 MG tablet Take 1 tablet (0.5 mg total) by mouth every 8 (eight) hours as needed for anxiety. 15 tablet 0  . aspirin EC 81 MG tablet Take 1 tablet (81 mg total) by mouth daily. 90 tablet 3  . Cholecalciferol (VITAMIN D) 2000 units CAPS Take  2,000 Units by mouth daily.    Marland Kitchen estradiol (ESTRACE VAGINAL) 0.1 MG/GM vaginal cream Use 1/2 gm intravaginally twice a week. 42.5 g 3  . ibuprofen (ADVIL,MOTRIN) 200 MG tablet Take 400 mg by mouth every 6 (six) hours as needed for headache or mild pain.    . metoprolol succinate (TOPROL-XL) 25 MG 24 hr tablet TAKE 1 TABLET (25 MG TOTAL) BY MOUTH DAILY. 90 tablet 3  . rosuvastatin (CRESTOR) 10 MG tablet Take 1 tablet (10 mg total) by mouth daily. 90 tablet 3   No current facility-administered medications on file prior to visit.     Past Medical History:  Diagnosis Date  . Cardiospasm 1983   felt may be related to MVP  . Hyperlipidemia   . Kidney cysts 11/2015   cyst on one of her kidneys, no treatment required  . Mitral valve prolapse   . PONV (postoperative nausea and vomiting)    teeth chipped  . Raynaud disease   . Retinal hole of right eye   . Shingles 2012   leg  . Tachycardia     Past Surgical History:  Procedure Laterality Date  . COLONOSCOPY  2007   [place], [place] ,[name]  . DILATATION & CURETTAGE/HYSTEROSCOPY WITH MYOSURE N/A 08/29/2016   Procedure: DILATATION & CURETTAGE/HYSTEROSCOPY;  Surgeon: Salvadore Dom, MD;  Location: [facility];  Service: Gynecology;  Laterality: N/A;  . DILATION AND CURETTAGE OF UTERUS    . EXCISION OF SKIN TAG Left 08/29/2016   Procedure: EXCISION OF SKIN TAG left labia majora;  Surgeon: Salvadore Dom, MD;  Location: [facility];  Service: Gynecology;  Laterality: Left;  . EXPLORATORY LAPAROTOMY     X2 for pain: fibroid, enlarged oviduct  . RETINAL LASER PROCEDURE Right 08/2015  . SALPINGECTOMY  2007 or 2008   and fibroid removed  . TUBAL LIGATION      Social History   Social History  . Marital status: Married    Spouse name: N/A  . Number of children: N/A  . Years of education: N/A   Social History Main Topics  . Smoking status: Never Smoker  . Smokeless tobacco: Never Used  . Alcohol use No  . Drug use: No  . Sexual  activity: Yes    Birth control/ protection: Other-see comments, Post-menopausal     Comment: BTL   Other Topics Concern  . Not on file   Social History Narrative   Exercises regularly    Family History  Problem Relation Age of Onset  . Heart attack Father 38  . Myasthenia gravis Father   . Hypertension Father   . Hyperlipidemia Father   . Osteoporosis Father        due to steroids for Myasthenia Grvis  . Leukemia Mother 5  . Hyperlipidemia Mother   . Hypertension Mother   . Hypothyroidism Mother   . Prostate cancer Brother        in 29s  . Hyperlipidemia Brother   . Stroke Maternal Grandfather        in 9s  . Diabetes Neg Hx     Review of Systems  Constitutional: Negative for appetite change, chills and fever.  Respiratory: Positive for shortness of breath (with exertion).   Cardiovascular: Positive for palpitations (occasional). Negative for chest pain and leg swelling.  Gastrointestinal: Positive for abdominal distention (boated at times), abdominal pain and constipation. Negative for blood in stool and nausea.       Has had a few burps ups - reflux over the past month  Genitourinary: Negative for dysuria and hematuria.       Objective:   Vitals:   09/26/17 0834  BP: (!) 144/82  Pulse: 72  Resp: 16  Temp: 97.9 F (36.6 C)  SpO2: 98%   Filed Weights   09/26/17 0834  Weight: 143 lb (64.9 kg)   Body mass index is 23.43 kg/m.  Wt Readings from Last 3 Encounters:  09/26/17 143 lb (64.9 kg)  06/04/17 143 lb (64.9 kg)  04/27/17 144 lb 12.8 oz (65.7 kg)     Physical Exam  Constitutional: She appears well-developed and well-nourished. No distress.  HENT:  Head: Normocephalic and atraumatic.  Eyes: Conjunctivae are normal.  Cardiovascular: Normal rate and regular rhythm.   No murmur heard. Pulmonary/Chest: Effort normal and breath sounds normal. No respiratory distress. She has no wheezes. She has no rales.  Abdominal: Soft. She exhibits no  distension and no mass. There is no tenderness. There is no rebound and no guarding.  Musculoskeletal: She exhibits no edema.  Skin: Skin is warm and dry. She is not diaphoretic.        Assessment & Plan:   See Problem List for Assessment and Plan of chronic medical problems.

## 2017-09-26 NOTE — Patient Instructions (Addendum)
  An abdominal ultrasound was ordered.  Someone will call you to schedule this.   Medications reviewed and updated.  Changes include decreasing the crestor to 5 mg daily for one month.    Up date me on your symptoms after a couple of weeks

## 2017-09-28 NOTE — Telephone Encounter (Signed)
Lab results reviewed with patient.  She verbalizes understanding to continue meds but will hold Crestor for 1 week due to GI symptoms.  Saw PCP for same and has abd [place] ordered.

## 2017-10-05 ENCOUNTER — Telehealth: Payer: Self-pay | Admitting: Internal Medicine

## 2017-10-05 NOTE — Telephone Encounter (Signed)
Pt called in and wants to know if she can take 3 zantac plus the peto a day.  She is hurting and not sure what to do til they can see if there is anything going on with the test she is having.

## 2017-10-05 NOTE — Telephone Encounter (Signed)
Yes ok to take 3 zantac and pepto

## 2017-10-05 NOTE — Telephone Encounter (Signed)
Spoke with pt to inform.  

## 2017-10-09 ENCOUNTER — Ambulatory Visit
Admission: RE | Admit: 2017-10-09 | Discharge: 2017-10-09 | Disposition: A | Discharge status: Home / Self Care | Payer: BLUE CROSS/BLUE SHIELD | Source: Ambulatory Visit | Attending: Internal Medicine | Admitting: Internal Medicine

## 2017-10-09 DIAGNOSIS — R1011 Right upper quadrant pain: Secondary | ICD-10-CM | POA: Diagnosis not present

## 2017-10-11 ENCOUNTER — Encounter: Payer: Self-pay | Admitting: Internal Medicine

## 2017-10-12 ENCOUNTER — Other Ambulatory Visit: Payer: BLUE CROSS/BLUE SHIELD

## 2017-10-23 ENCOUNTER — Other Ambulatory Visit (HOSPITAL_COMMUNITY): Payer: Self-pay | Admitting: Gastroenterology

## 2017-10-23 DIAGNOSIS — R1011 Right upper quadrant pain: Secondary | ICD-10-CM

## 2017-11-02 ENCOUNTER — Ambulatory Visit (HOSPITAL_COMMUNITY)
Admission: RE | Admit: 2017-11-02 | Discharge: 2017-11-02 | Disposition: A | Discharge status: Home / Self Care | Payer: BLUE CROSS/BLUE SHIELD | Source: Ambulatory Visit | Attending: Gastroenterology | Admitting: Gastroenterology

## 2017-11-02 DIAGNOSIS — R1011 Right upper quadrant pain: Secondary | ICD-10-CM | POA: Diagnosis not present

## 2017-11-02 MED ORDER — TECHNETIUM TC 99M MEBROFENIN IV KIT
5.0000 | PACK | Freq: Once | INTRAVENOUS | Status: AC | PRN
  Administered 2017-11-02: 5 via INTRAVENOUS

## 2017-12-03 ENCOUNTER — Encounter: Payer: Self-pay | Admitting: Internal Medicine

## 2017-12-03 ENCOUNTER — Ambulatory Visit (INDEPENDENT_AMBULATORY_CARE_PROVIDER_SITE_OTHER): Payer: BLUE CROSS/BLUE SHIELD | Admitting: Internal Medicine

## 2017-12-03 VITALS — BP 128/74 | HR 74 | Ht 66.0 in | Wt 142.6 lb

## 2017-12-03 DIAGNOSIS — E042 Nontoxic multinodular goiter: Secondary | ICD-10-CM | POA: Insufficient documentation

## 2017-12-03 NOTE — Patient Instructions (Addendum)
We will schedule a new thyroid ultrasound for you.  You will be called to establish the date and time of the test.  Please come back for a follow-up appointment in 1.5 year.

## 2017-12-03 NOTE — Progress Notes (Addendum)
Patient ID: Tiffany Moran, female   DOB: [birth year], 61 y.o.   MRN: [id number]   HPI  Tiffany Moran is a 60 y.o.-year-old female, returning for follow-up for thyroid nodules.  Last visit a year ago.  Reviewed and addended history: She describes that she had a "thyroid storm" episode >20 years ago.  In 10/2016, she noticed pain with swallowing. She took Advil >> not helping >> worse >> saw PCP >> TFTs normal. She also had a thyroid U/S >> small thyroid nodules, possibly related to her previous episode of thyroiditis.  Thyroid U/S (11/20/2016):  Isthmus: Measures 0.4 cm in thickness. Small hypoechoic structure just superior to the isthmus measures 0.3 cm. This may represent a small node.  Right lobe: Measures 4.1 x 1.7 x 1.7 cm. Right thyroid tissue is diffusely heterogeneous without a discrete nodule. Small lymph nodes on the right side of the neck.  Left lobe: Measures 5.0 x 1.3 x 1.7 cm. Nodule # 1: Location: Left; Superior Size: 0.7 x 0.4 x 0.6 cm. Composition: solid/almost completely solid (2) Echogenicity: cannot determine (1) ACR TI-RADS total points: 3.  Nodule # 2: Location: Left; Mid Size: 1.2 x 0.6 x 0.7 cm. Composition: solid/almost completely solid (2) Echogenic foci: punctate echogenic foci (3), questionable ACR TI-RADS total points: 6.  Nodule # 3: Location: Left; Inferior Size: 0.6 x 0.4 x 0.3 cm. Composition: solid/almost completely solid (2) Echogenicity: hypoechoic (2) Margins: extra-thyroidal extension (3) Echogenic foci: punctate echogenic foci (3) ACR TI-RADS total points: 10.  Small lymph nodes on L side of the neck.  One of the nodules was described as possible parathyroid enlargement, however, patient's calcium levels have been normal: Lab Results  Component Value Date   CALCIUM 9.8 02/21/2017   CALCIUM 9.7 08/28/2016   CALCIUM 9.8 02/14/2016   CALCIUM 9.8 05/07/2015   CALCIUM 9.8 05/19/2014   Pt denies: - feeling nodules in neck -  hoarseness - dysphagia - choking - SOB with lying down She did have a period of 3 weeks this fall in which she had to clear her throat often, but she believes that this was related to a URI.  I reviewed pt's thyroid tests - normal: Lab Results  Component Value Date   TSH 2.32 02/21/2017   TSH 1.09 11/08/2016   TSH 1.65 02/14/2016   TSH 1.823 05/25/2015   TSH 2.309 05/19/2014   TSH 1.79 01/30/2014   TSH 1.63 12/25/2012   TSH 1.87 12/11/2011   TSH 1.35 01/05/2010   FREET4 1.01 11/08/2016   FREET4 0.9 01/05/2010    Patient's thyroid antibodies have been negative: Component     Latest Ref Rng & Units 12/01/2016  Thyroperoxidase Ab SerPl-aCnc     <9 IU/mL 2  Thyroglobulin Ab     <2 IU/mL <1   + FH of thyroid ds.: both parents - hypothyroidism. No FH of thyroid cancer. No h/o radiation tx to head or neck.  No seaweed or kelp. No recent contrast studies. No herbal supplements. H/o Biotin use, not recently. No recent steroids use.   FH of MG in father.  ROS: Constitutional: no weight gain/no weight loss, no fatigue, no subjective hyperthermia, no subjective hypothermia Eyes: no blurry vision, no xerophthalmia ENT: no sore throat, + see HPI Cardiovascular: no CP/no SOB/palpitations/no leg swelling Respiratory: no cough/no SOB/no wheezing Gastrointestinal: no N/no V/no D/no C/no acid reflux/+ h/o AP >> resolved after decreasing the dose of Crestor Musculoskeletal: no muscle aches/no joint aches Skin: no rashes, no hair loss Neurological:  no tremors/no numbness/no tingling/no dizziness  I reviewed pt's medications, allergies, PMH, social hx, family hx, and changes were documented in the history of present illness. Otherwise, unchanged from my initial visit note.  Initially stopped Crestor due to abdominal pain, now restarted Crestor >> on 5 mg daily  Past Medical History:  Diagnosis Date  . Cardiospasm 1983   felt may be related to MVP  . Hyperlipidemia   . Kidney cysts  11/2015   cyst on one of her kidneys, no treatment required  . Mitral valve prolapse   . PONV (postoperative nausea and vomiting)    teeth chipped  . Raynaud disease   . Retinal hole of right eye   . Shingles 2012   leg  . Tachycardia    Past Surgical History:  Procedure Laterality Date  . COLONOSCOPY  2007   [place], [place] ,[name]  . DILATATION & CURETTAGE/HYSTEROSCOPY WITH MYOSURE N/A 08/29/2016   Procedure: DILATATION & CURETTAGE/HYSTEROSCOPY;  Surgeon: Salvadore Dom, MD;  Location: [facility];  Service: Gynecology;  Laterality: N/A;  . DILATION AND CURETTAGE OF UTERUS    . EXCISION OF SKIN TAG Left 08/29/2016   Procedure: EXCISION OF SKIN TAG left labia majora;  Surgeon: Salvadore Dom, MD;  Location: [facility];  Service: Gynecology;  Laterality: Left;  . EXPLORATORY LAPAROTOMY     X2 for pain: fibroid, enlarged oviduct  . RETINAL LASER PROCEDURE Right 08/2015  . SALPINGECTOMY  2007 or 2008   and fibroid removed  . TUBAL LIGATION     Social History   Social History  . Marital status: Married    Spouse name: N/A  . Number of children: 2   Occupational History  . Retired Therapist, sports   Social History Main Topics  . Smoking status: Never Smoker  . Smokeless tobacco: Never Used  . Alcohol use No  . Drug use: No  . Sexual activity: Yes    Birth control/ protection: Other-see comments, Post-menopausal     Comment: BTL   Current Outpatient Medications on File Prior to Visit  Medication Sig Dispense Refill  . ALPRAZolam (XANAX) 0.5 MG tablet Take 1 tablet (0.5 mg total) by mouth every 8 (eight) hours as needed for anxiety. 15 tablet 0  . aspirin EC 81 MG tablet Take 1 tablet (81 mg total) by mouth daily. 90 tablet 3  . Cholecalciferol (VITAMIN D) 2000 units CAPS Take 2,000 Units by mouth daily.    Marland Kitchen estradiol (ESTRACE VAGINAL) 0.1 MG/GM vaginal cream Use 1/2 gm intravaginally twice a week. 42.5 g 3  . ibuprofen (ADVIL,MOTRIN) 200 MG tablet Take 400 mg by mouth every 6 (six)  hours as needed for headache or mild pain.    . metoprolol succinate (TOPROL-XL) 25 MG 24 hr tablet TAKE 1 TABLET (25 MG TOTAL) BY MOUTH DAILY. 90 tablet 3  . rosuvastatin (CRESTOR) 10 MG tablet Take 1 tablet (10 mg total) by mouth daily. 90 tablet 3   No current facility-administered medications on file prior to visit.    Allergies  Allergen Reactions  . Lipitor [Atorvastatin] Other (See Comments)    Did not tolerate   Family History  Problem Relation Age of Onset  . Heart attack Father 41  . Myasthenia gravis Father   . Hypertension Father   . Hyperlipidemia Father   . Osteoporosis Father        due to steroids for Myasthenia Grvis  . Leukemia Mother 83  . Hyperlipidemia Mother   . Hypertension Mother   .  Hypothyroidism Mother   . Prostate cancer Brother        in 24s  . Hyperlipidemia Brother   . Stroke Maternal Grandfather        in 55s  . Diabetes Neg Hx    PE: LMP 06/17/2009 (Approximate)  Wt Readings from Last 3 Encounters:  09/26/17 143 lb (64.9 kg)  06/04/17 143 lb (64.9 kg)  04/27/17 144 lb 12.8 oz (65.7 kg)   Constitutional: normal weight, in NAD Eyes: PERRLA, EOMI, no exophthalmos ENT: moist mucous membranes, no thyromegaly, no cervical lymphadenopathy Cardiovascular: RRR, No MRG Respiratory: CTA B Gastrointestinal: abdomen soft, NT, ND, BS+ Musculoskeletal: no deformities, strength intact in all 4 Skin: moist, warm, no rashes Neurological: no tremor with outstretched hands, DTR normal in all 4  ASSESSMENT: 1. Multiple thyroid nodules  PLAN: 1. Multiple thyroid nodules - I reviewed the images of her thyroid ultrasound.  The dominant nodules are small, and not well the limited from the surrounding tissues appearing more like inflammatory (pseudo--) nodules.  They are not hypoechoic, without microcalcifications but with colloid granules, without internal blood flow and more wide than tall.  - Patient does not have a family history of thyroid cancer or  history of radiation therapy to her head or her neck.  Both of these would increase her risk of thyroid cancer. - We discussed about the fact that we do not know for sure what caused the nodules. I believe that they may be related to her previous episode of thyrotoxicosis, most likely thyroiditis, which she experienced more than 20 years ago. - She was interested whether her thyrotoxic symptoms from 20 years ago may return.  We discussed that currently there is no indication that her thyroid is dysfunctional but I did explain what symptoms she can expect and let me know if she develops them.  Reviewed together her latest TSH which was from 02/2017 and it was normal.  She will have another TSH in 01/2018 at her next annual physical exam by PCP.  We will not repeat it today. - We will repeat her thyroid ultrasound now, a year from the previous and if nodules are unchanged, I would suggest to repeat the ultrasound 3 years from now.  We will schedule her to come back 1.5 years from now for another clinical check. - However, I advised her to let me know if she develops neck compression symptoms, in that case, we may need a sooner thyroid ultrasound possibly thyroid lobectomy.  Orders Placed This Encounter  Procedures  . US THYROID    Standing Status:   Future    Number of Occurrences:   1    Standing Expiration Date:   02/03/2019    Scheduling Instructions:     Pt prefers to have the test before the end of the year    Order Specific Question:   Reason for Exam (SYMPTOM  OR DIAGNOSIS REQUIRED)    Answer:   f/u thyroid nodules    Order Specific Question:   Preferred imaging location?    Answer:   [facility]   - time spent with the patient: 25 min, of which >50% was spent in obtaining information about her symptoms, reviewing her previous labs, evaluations, and treatments, counseling her about her condition (please see the discussed topics above), and developing a plan to further investigate  it; she had a number of questions which I addressed.   Thyroid U/S (12/04/2017):  Isthmus: 0.2 cm, previously 0.4 cm  Right  lobe: 3.8 x 1.0 x 1.2 cm, previously 4.1 x 1.7 x 1.7 cm  Left lobe: 4.9 x 1.2 x 1.6 cm, previously 4.9 x 1.3 x 1.7 cm  Nodule # 1: Prior biopsy: No Location: Left; Superior Maximum size: 0.6 cm; Other 2 dimensions: 0.6 x 0.5 cm, previously, 0.7 x 0.4 x 0.6 cm Composition: solid/almost completely solid (2) Echogenicity: hypoechoic (2) Echogenic foci: punctate echogenic foci (3) ACR TI-RADS total points: 7.  Nodule # 2: Prior biopsy: No Location: Left; Mid Maximum size: 1.3 cm; Other 2 dimensions: 0.9 x 0.6 cm, previously, 1.2 x 0.7 x 0.6 cm Composition: solid/almost completely solid (2) Echogenicity: isoechoic (1) Echogenic foci: punctate echogenic foci (3) ACR TI-RADS total points: 6.  IMPRESSION: Left nodules 1 and 2 are not significantly changed and meet criteria for annual follow-up. Left nodule 3 seen previously was not appreciated today.  No significant change in the left sup. And mid nodules. Left inf. Nodule not seen this time.  Philemon Kingdom, MD PhD [facility] Endocrinology

## 2017-12-04 ENCOUNTER — Ambulatory Visit
Admission: RE | Admit: 2017-12-04 | Discharge: 2017-12-04 | Disposition: A | Discharge status: Home / Self Care | Payer: BLUE CROSS/BLUE SHIELD | Source: Ambulatory Visit | Attending: Internal Medicine | Admitting: Internal Medicine

## 2017-12-04 ENCOUNTER — Encounter: Payer: Self-pay | Admitting: Internal Medicine

## 2017-12-04 DIAGNOSIS — E042 Nontoxic multinodular goiter: Secondary | ICD-10-CM | POA: Diagnosis not present

## 2017-12-13 DIAGNOSIS — H2513 Age-related nuclear cataract, bilateral: Secondary | ICD-10-CM | POA: Diagnosis not present

## 2017-12-13 DIAGNOSIS — H04123 Dry eye syndrome of bilateral lacrimal glands: Secondary | ICD-10-CM | POA: Diagnosis not present

## 2017-12-13 DIAGNOSIS — H35371 Puckering of macula, right eye: Secondary | ICD-10-CM | POA: Diagnosis not present

## 2017-12-13 DIAGNOSIS — H40013 Open angle with borderline findings, low risk, bilateral: Secondary | ICD-10-CM | POA: Diagnosis not present

## 2018-02-07 ENCOUNTER — Telehealth: Payer: Self-pay | Admitting: Internal Medicine

## 2018-02-07 NOTE — Telephone Encounter (Signed)
New message    Patient wants to know when she needs to have lab work done and when should she return for follow up appt since her medication was reduced

## 2018-02-07 NOTE — Telephone Encounter (Signed)
Patient has appt with PR on 04/17/18. Was off Crestor mid Oct for 5 weeks for some gi issues, (thought it was r/t reflux) restarted at 5 mg daily. Wants to know if she should try going back to 10 mg. Has appointment with PCP in March.  Will have lipids checked at that time.  Asked that results be forwarded to Dr. Harrington Challenger and at that time adjustment can be made to med if necessary.

## 2018-02-15 ENCOUNTER — Other Ambulatory Visit: Payer: Self-pay | Admitting: Internal Medicine

## 2018-02-21 NOTE — Patient Instructions (Addendum)
Test(s) ordered today. Your results will be released to [facility] (or called to you) after review, usually within 72hours after test completion. If any changes need to be made, you will be notified at that same time.  All other Health Maintenance issues reviewed.   All recommended immunizations and age-appropriate screenings are up-to-date or discussed.  No immunizations administered today.   Medications reviewed and updated.  Changes include keflex for your finger infection.    Your prescription(s) have been submitted to your pharmacy. Please take as directed and contact our office if you believe you are having problem(s) with the medication(s).  Please followup in one year   Health Maintenance, Female Adopting a healthy lifestyle and getting preventive care can go a long way to promote health and wellness. Talk with your health care provider about what schedule of regular examinations is right for you. This is a good chance for you to check in with your provider about disease prevention and staying healthy. In between checkups, there are plenty of things you can do on your own. Experts have done a lot of research about which lifestyle changes and preventive measures are most likely to keep you healthy. Ask your health care provider for more information. Weight and diet Eat a healthy diet  Be sure to include plenty of vegetables, fruits, low-fat dairy products, and lean protein.  Do not eat a lot of foods high in solid fats, added sugars, or salt.  Get regular exercise. This is one of the most important things you can do for your health. ? Most adults should exercise for at least 150 minutes each week. The exercise should increase your heart rate and make you sweat (moderate-intensity exercise). ? Most adults should also do strengthening exercises at least twice a week. This is in addition to the moderate-intensity exercise.  Maintain a healthy weight  Body mass index (BMI) is a  measurement that can be used to identify possible weight problems. It estimates body fat based on height and weight. Your health care provider can help determine your BMI and help you achieve or maintain a healthy weight.  For females 42 years of age and older: ? A BMI below 18.5 is considered underweight. ? A BMI of 18.5 to 24.9 is normal. ? A BMI of 25 to 29.9 is considered overweight. ? A BMI of 30 and above is considered obese.  Watch levels of cholesterol and blood lipids  You should start having your blood tested for lipids and cholesterol at 63 years of age, then have this test every 5 years.  You may need to have your cholesterol levels checked more often if: ? Your lipid or cholesterol levels are high. ? You are older than 63 years of age. ? You are at high risk for heart disease.  Cancer screening Lung Cancer  Lung cancer screening is recommended for adults 62-42 years old who are at high risk for lung cancer because of a history of smoking.  A yearly low-dose CT scan of the lungs is recommended for people who: ? Currently smoke. ? Have quit within the past 15 years. ? Have at least a 30-pack-year history of smoking. A pack year is smoking an average of one pack of cigarettes a day for 1 year.  Yearly screening should continue until it has been 15 years since you quit.  Yearly screening should stop if you develop a health problem that would prevent you from having lung cancer treatment.  Breast Cancer  Practice  breast self-awareness. This means understanding how your breasts normally appear and feel.  It also means doing regular breast self-exams. Let your health care provider know about any changes, no matter how small.  If you are in your 20s or 30s, you should have a clinical breast exam (CBE) by a health care provider every 1-3 years as part of a regular health exam.  If you are 49 or older, have a CBE every year. Also consider having a breast X-ray (mammogram)  every year.  If you have a family history of breast cancer, talk to your health care provider about genetic screening.  If you are at high risk for breast cancer, talk to your health care provider about having an MRI and a mammogram every year.  Breast cancer gene (BRCA) assessment is recommended for women who have family members with BRCA-related cancers. BRCA-related cancers include: ? Breast. ? Ovarian. ? Tubal. ? Peritoneal cancers.  Results of the assessment will determine the need for genetic counseling and BRCA1 and BRCA2 testing.  Cervical Cancer Your health care provider may recommend that you be screened regularly for cancer of the pelvic organs (ovaries, uterus, and vagina). This screening involves a pelvic examination, including checking for microscopic changes to the surface of your cervix (Pap test). You may be encouraged to have this screening done every 3 years, beginning at age 67.  For women ages 52-65, health care providers may recommend pelvic exams and Pap testing every 3 years, or they may recommend the Pap and pelvic exam, combined with testing for human papilloma virus (HPV), every 5 years. Some types of HPV increase your risk of cervical cancer. Testing for HPV may also be done on women of any age with unclear Pap test results.  Other health care providers may not recommend any screening for nonpregnant women who are considered low risk for pelvic cancer and who do not have symptoms. Ask your health care provider if a screening pelvic exam is right for you.  If you have had past treatment for cervical cancer or a condition that could lead to cancer, you need Pap tests and screening for cancer for at least 20 years after your treatment. If Pap tests have been discontinued, your risk factors (such as having a new sexual partner) need to be reassessed to determine if screening should resume. Some women have medical problems that increase the chance of getting cervical  cancer. In these cases, your health care provider may recommend more frequent screening and Pap tests.  Colorectal Cancer  This type of cancer can be detected and often prevented.  Routine colorectal cancer screening usually begins at 63 years of age and continues through 63 years of age.  Your health care provider may recommend screening at an earlier age if you have risk factors for colon cancer.  Your health care provider may also recommend using home test kits to check for hidden blood in the stool.  A small camera at the end of a tube can be used to examine your colon directly (sigmoidoscopy or colonoscopy). This is done to check for the earliest forms of colorectal cancer.  Routine screening usually begins at age 40.  Direct examination of the colon should be repeated every 5-10 years through 63 years of age. However, you may need to be screened more often if early forms of precancerous polyps or small growths are found.  Skin Cancer  Check your skin from head to toe regularly.  Tell your health care provider  about any new moles or changes in moles, especially if there is a change in a mole's shape or color.  Also tell your health care provider if you have a mole that is larger than the size of a pencil eraser.  Always use sunscreen. Apply sunscreen liberally and repeatedly throughout the day.  Protect yourself by wearing long sleeves, pants, a wide-brimmed hat, and sunglasses whenever you are outside.  Heart disease, diabetes, and high blood pressure  High blood pressure causes heart disease and increases the risk of stroke. High blood pressure is more likely to develop in: ? People who have blood pressure in the high end of the normal range (130-139/85-89 mm Hg). ? People who are overweight or obese. ? People who are African American.  If you are 83-3 years of age, have your blood pressure checked every 3-5 years. If you are 79 years of age or older, have your blood  pressure checked every year. You should have your blood pressure measured twice-once when you are at a hospital or clinic, and once when you are not at a hospital or clinic. Record the average of the two measurements. To check your blood pressure when you are not at a hospital or clinic, you can use: ? An automated blood pressure machine at a pharmacy. ? A home blood pressure monitor.  If you are between 95 years and 47 years old, ask your health care provider if you should take aspirin to prevent strokes.  Have regular diabetes screenings. This involves taking a blood sample to check your fasting blood sugar level. ? If you are at a normal weight and have a low risk for diabetes, have this test once every three years after 63 years of age. ? If you are overweight and have a high risk for diabetes, consider being tested at a younger age or more often. Preventing infection Hepatitis B  If you have a higher risk for hepatitis B, you should be screened for this virus. You are considered at high risk for hepatitis B if: ? You were born in a country where hepatitis B is common. Ask your health care provider which countries are considered high risk. ? Your parents were born in a high-risk country, and you have not been immunized against hepatitis B (hepatitis B vaccine). ? You have HIV or AIDS. ? You use needles to inject street drugs. ? You live with someone who has hepatitis B. ? You have had sex with someone who has hepatitis B. ? You get hemodialysis treatment. ? You take certain medicines for conditions, including cancer, organ transplantation, and autoimmune conditions.  Hepatitis C  Blood testing is recommended for: ? Everyone born from [birth year] through 1966. ? Anyone with known risk factors for hepatitis C.  Sexually transmitted infections (STIs)  You should be screened for sexually transmitted infections (STIs) including gonorrhea and chlamydia if: ? You are sexually active and are  younger than 63 years of age. ? You are older than 63 years of age and your health care provider tells you that you are at risk for this type of infection. ? Your sexual activity has changed since you were last screened and you are at an increased risk for chlamydia or gonorrhea. Ask your health care provider if you are at risk.  If you do not have HIV, but are at risk, it may be recommended that you take a prescription medicine daily to prevent HIV infection. This is called pre-exposure prophylaxis (PrEP). You are  considered at risk if: ? You are sexually active and do not regularly use condoms or know the HIV status of your partner(s). ? You take drugs by injection. ? You are sexually active with a partner who has HIV.  Talk with your health care provider about whether you are at high risk of being infected with HIV. If you choose to begin PrEP, you should first be tested for HIV. You should then be tested every 3 months for as long as you are taking PrEP. Pregnancy  If you are premenopausal and you may become pregnant, ask your health care provider about preconception counseling.  If you may become pregnant, take 400 to 800 micrograms (mcg) of folic acid every day.  If you want to prevent pregnancy, talk to your health care provider about birth control (contraception). Osteoporosis and menopause  Osteoporosis is a disease in which the bones lose minerals and strength with aging. This can result in serious bone fractures. Your risk for osteoporosis can be identified using a bone density scan.  If you are 39 years of age or older, or if you are at risk for osteoporosis and fractures, ask your health care provider if you should be screened.  Ask your health care provider whether you should take a calcium or vitamin D supplement to lower your risk for osteoporosis.  Menopause may have certain physical symptoms and risks.  Hormone replacement therapy may reduce some of these symptoms and  risks. Talk to your health care provider about whether hormone replacement therapy is right for you. Follow these instructions at home:  Schedule regular health, dental, and eye exams.  Stay current with your immunizations.  Do not use any tobacco products including cigarettes, chewing tobacco, or electronic cigarettes.  If you are pregnant, do not drink alcohol.  If you are breastfeeding, limit how much and how often you drink alcohol.  Limit alcohol intake to no more than 1 drink per day for nonpregnant women. One drink equals 12 ounces of beer, 5 ounces of wine, or 1 ounces of hard liquor.  Do not use street drugs.  Do not share needles.  Ask your health care provider for help if you need support or information about quitting drugs.  Tell your health care provider if you often feel depressed.  Tell your health care provider if you have ever been abused or do not feel safe at home. This information is not intended to replace advice given to you by your health care provider. Make sure you discuss any questions you have with your health care provider. Document Released: 06/19/2011 Document Revised: 05/11/2016 Document Reviewed: 09/07/2015 Elsevier Interactive Patient Education  [employer].

## 2018-02-21 NOTE — Progress Notes (Signed)
Subjective:    Patient ID: Tiffany Moran, female    DOB: May 07, 1956, 62 y.o.   MRN: [id number]  HPI She is here for a physical exam.   She had diarrhea yesterday.  It was severe and she had to lay on the floor and broke out in a sweat.  The rest of the day she was cold.  She has been ok since then.  This morning around 3 am she started having a "spasm" pain in her RUQ.  It went away just a little while ago, but she has this on occasion and it is mild since being here.  She had a small bowel movement this morning.  She had eaten more spinach salad the past few days.  She was unsure if it was related to the spinach or a bacterial infection.  Her right index dip joint gets very irritatated red and tender.  She thinks it gets irritated when she flosses her teeth because of loss wise on this area.  He had a day it looked worse and she did take amoxicillin she had left over from a dental procedure and that seemed to help.  She denies any pus.  She bruises easily.    Sometimes her calf is sore.    Medications and allergies reviewed with patient and updated if appropriate.  Patient Active Problem List   Diagnosis Date Noted  . Multiple thyroid nodules 12/03/2017  . RUQ abdominal pain 09/26/2017  . Anxiety 09/26/2017  . Liver hemangioma 12/05/2016  . Renal cyst, right 12/05/2016  . 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 Pes anserine bursitis 04/21/2014  . 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 Osteopenia 08/04/2011  . Unspecified vitamin D deficiency 08/04/2011  . History of mitral valve prolapse 07/26/2010  . 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 RAYNAUD'S SYNDROME 01/05/2010  . Hyperlipidemia 08/17/2009  . POLYARTHRITIS 08/17/2009    Current Outpatient Medications on File Prior to Visit  Medication Sig Dispense Refill  . ALPRAZolam (XANAX) 0.5 MG tablet Take 1 tablet (0.5 mg total) by mouth every 8 (eight) hours as needed for anxiety. 15 tablet 0  . aspirin EC 81 MG  tablet Take 1 tablet (81 mg total) by mouth daily. 90 tablet 3  . Cholecalciferol (VITAMIN D) 2000 units CAPS Take 2,000 Units by mouth daily.    Marland Kitchen estradiol (ESTRACE VAGINAL) 0.1 MG/GM vaginal cream Use 1/2 gm intravaginally twice a week. 42.5 g 3  . ibuprofen (ADVIL,MOTRIN) 200 MG tablet Take 400 mg by mouth every 6 (six) hours as needed for headache or mild pain.    . rosuvastatin (CRESTOR) 10 MG tablet Take 1 tablet (10 mg total) by mouth daily. 90 tablet 3   No current facility-administered medications on file prior to visit.     Past Medical History:  Diagnosis Date  . Cardiospasm 1983   felt may be related to MVP  . Hyperlipidemia   . Kidney cysts 11/2015   cyst on one of her kidneys, no treatment required  . Mitral valve prolapse   . PONV (postoperative nausea and vomiting)    teeth chipped  . Raynaud disease   . Retinal hole of right eye   . Shingles 2012   leg  . Tachycardia     Past Surgical History:  Procedure Laterality Date  . COLONOSCOPY  2007   [place], [place] ,[name]  . DILATATION & CURETTAGE/HYSTEROSCOPY WITH MYOSURE N/A 08/29/2016   Procedure: DILATATION & CURETTAGE/HYSTEROSCOPY;  Surgeon: Francesca Jewett  Talbert Nan, MD;  Location: [facility];  Service: Gynecology;  Laterality: N/A;  . DILATION AND CURETTAGE OF UTERUS    . EXCISION OF SKIN TAG Left 08/29/2016   Procedure: EXCISION OF SKIN TAG left labia majora;  Surgeon: Salvadore Dom, MD;  Location: [facility];  Service: Gynecology;  Laterality: Left;  . EXPLORATORY LAPAROTOMY     X2 for pain: fibroid, enlarged oviduct  . RETINAL LASER PROCEDURE Right 08/2015  . SALPINGECTOMY  2007 or 2008   and fibroid removed  . TUBAL LIGATION      Social History   Socioeconomic History  . Marital status: Married    Spouse name: None  . Number of children: None  . Years of education: None  . Highest education level: None  Social Needs  . Financial resource strain: None  . Food insecurity - worry: None  . Food insecurity  - inability: None  . Transportation needs - medical: None  . Transportation needs - non-medical: None  Occupational History  . None  Tobacco Use  . Smoking status: Never Smoker  . Smokeless tobacco: Never Used  Substance and Sexual Activity  . Alcohol use: No  . Drug use: No  . Sexual activity: Yes    Birth control/protection: Other-see comments, Post-menopausal    Comment: BTL  Other Topics Concern  . None  Social History Narrative   Exercises regularly    Family History  Problem Relation Age of Onset  . Heart attack Father 33  . Myasthenia gravis Father   . Hypertension Father   . Hyperlipidemia Father   . Osteoporosis Father        due to steroids for Myasthenia Grvis  . Leukemia Mother 87  . Hyperlipidemia Mother   . Hypertension Mother   . Hypothyroidism Mother   . Prostate cancer Brother        in 49s  . Hyperlipidemia Brother   . Stroke Maternal Grandfather        in 50s  . Diabetes Neg Hx     Review of Systems  Constitutional: Negative for chills and fever.  Eyes: Negative for visual disturbance.  Respiratory: Negative for cough, shortness of breath and wheezing.   Cardiovascular: Positive for palpitations and leg swelling (trace in ankles). Negative for chest pain.  Gastrointestinal: Positive for diarrhea (yesterday). Negative for abdominal pain, blood in stool, constipation and nausea.       No gerd  Genitourinary: Negative for dysuria and hematuria.  Musculoskeletal: Positive for arthralgias (left knee) and back pain.  Skin: Negative for color change and rash.  Neurological: Negative for dizziness, light-headedness, numbness and headaches.  Psychiatric/Behavioral: Negative for dysphoric mood. The patient is not nervous/anxious.        Objective:   Vitals:   02/22/18 0836  BP: 138/86  Pulse: 80  Resp: 16  Temp: 98.6 F (37 C)  SpO2: 99%   Filed Weights   02/22/18 0836  Weight: 143 lb (64.9 kg)   Body mass index is 23.08 kg/m.  Wt  Readings from Last 3 Encounters:  02/22/18 143 lb (64.9 kg)  12/03/17 142 lb 9.6 oz (64.7 kg)  09/26/17 143 lb (64.9 kg)     Physical Exam Constitutional: She appears well-developed and well-nourished. No distress.  HENT:  Head: Normocephalic and atraumatic.  Right Ear: External ear normal. Normal ear canal and TM Left Ear: External ear normal.  Normal ear canal and TM Mouth/Throat: Oropharynx is clear and moist.  Eyes: Conjunctivae and EOM are  normal.  Neck: Neck supple. No tracheal deviation present. No thyromegaly present.  No carotid bruit  Cardiovascular: Normal rate, regular rhythm and normal heart sounds.   No murmur heard.  No edema. Pulmonary/Chest: Effort normal and breath sounds normal. No respiratory distress. She has no wheezes. She has no rales.  Breast: deferred to Gyn Abdominal: Soft. She exhibits no distension. There is no tenderness.  Lymphadenopathy: She has no cervical adenopathy.  Skin: Skin is warm and dry. She is not diaphoretic. Right index PIP joint erythematous, slightly swollen, minimally tender.  No pus, no open wound Psychiatric: She has a normal mood and affect. Her behavior is normal.        Assessment & Plan:   Physical exam: Screening blood work  ordered Immunizations  Discussed shingrix, others up to date or deferred Colonoscopy  Up to date  Mammogram   Up to date  Gyn   Up to date  Dexa  Up to date  Eye exams   Up to date  EKG     Last done 04/2017 Exercise   - walking  Weight   normal BMI Skin  No concerns - just wants to make sure moles are ok Substance abuse   none  See Problem List for Assessment and Plan of chronic medical problems.    FU in one year

## 2018-02-22 ENCOUNTER — Ambulatory Visit (INDEPENDENT_AMBULATORY_CARE_PROVIDER_SITE_OTHER): Payer: BLUE CROSS/BLUE SHIELD | Admitting: Internal Medicine

## 2018-02-22 ENCOUNTER — Other Ambulatory Visit (INDEPENDENT_AMBULATORY_CARE_PROVIDER_SITE_OTHER): Payer: BLUE CROSS/BLUE SHIELD

## 2018-02-22 ENCOUNTER — Encounter: Payer: Self-pay | Admitting: Internal Medicine

## 2018-02-22 VITALS — BP 138/86 | HR 80 | Temp 98.6°F | Resp 16 | Ht 66.0 in | Wt 143.0 lb

## 2018-02-22 DIAGNOSIS — I471 Supraventricular tachycardia: Secondary | ICD-10-CM

## 2018-02-22 DIAGNOSIS — R197 Diarrhea, unspecified: Secondary | ICD-10-CM

## 2018-02-22 DIAGNOSIS — E785 Hyperlipidemia, unspecified: Secondary | ICD-10-CM

## 2018-02-22 DIAGNOSIS — Z0000 Encounter for general adult medical examination without abnormal findings: Secondary | ICD-10-CM

## 2018-02-22 DIAGNOSIS — L03011 Cellulitis of right finger: Secondary | ICD-10-CM | POA: Diagnosis not present

## 2018-02-22 DIAGNOSIS — L0390 Cellulitis, unspecified: Secondary | ICD-10-CM | POA: Insufficient documentation

## 2018-02-22 DIAGNOSIS — E042 Nontoxic multinodular goiter: Secondary | ICD-10-CM

## 2018-02-22 DIAGNOSIS — M8580 Other specified disorders of bone density and structure, unspecified site: Secondary | ICD-10-CM

## 2018-02-22 LAB — COMPREHENSIVE METABOLIC PANEL
ALK PHOS: 65 U/L (ref 39–117)
ALT: 26 U/L (ref 0–35)
AST: 18 U/L (ref 0–37)
Albumin: 4.5 g/dL (ref 3.5–5.2)
BILIRUBIN TOTAL: 0.8 mg/dL (ref 0.2–1.2)
BUN: 11 mg/dL (ref 6–23)
CALCIUM: 9.9 mg/dL (ref 8.4–10.5)
CO2: 29 mEq/L (ref 19–32)
Chloride: 104 mEq/L (ref 96–112)
Creatinine, Ser: 0.69 mg/dL (ref 0.40–1.20)
GFR: 91.53 mL/min (ref >60.00)
Glucose, Bld: 98 mg/dL (ref 70–99)
POTASSIUM: 4 meq/L (ref 3.5–5.1)
Sodium: 141 mEq/L (ref 135–145)
TOTAL PROTEIN: 7.1 g/dL (ref 6.0–8.3)

## 2018-02-22 LAB — CBC WITH DIFFERENTIAL/PLATELET
BASOS ABS: 0 10*3/uL (ref 0.0–0.1)
Basophils Relative: 0.4 % (ref 0.0–3.0)
Eosinophils Absolute: 0 10*3/uL (ref 0.0–0.7)
Eosinophils Relative: 1 % (ref 0.0–5.0)
HEMATOCRIT: 41.5 % (ref 36.0–46.0)
HEMOGLOBIN: 14 g/dL (ref 12.0–15.0)
LYMPHS PCT: 24.1 % (ref 12.0–46.0)
Lymphs Abs: 1.1 10*3/uL (ref 0.7–4.0)
MCHC: 33.7 g/dL (ref 30.0–36.0)
MCV: 90.2 fl (ref 78.0–100.0)
MONOS PCT: 4.5 % (ref 3.0–12.0)
Monocytes Absolute: 0.2 10*3/uL (ref 0.1–1.0)
NEUTROS PCT: 70 % (ref 43.0–77.0)
Neutro Abs: 3.1 10*3/uL (ref 1.4–7.7)
Platelets: 160 10*3/uL (ref 150.0–400.0)
RBC: 4.6 Mil/uL (ref 3.87–5.11)
RDW: 13.3 % (ref 11.5–15.5)
WBC: 4.5 10*3/uL (ref 4.0–10.5)

## 2018-02-22 LAB — LIPID PANEL
CHOL/HDL RATIO: 2
Cholesterol: 166 mg/dL (ref 0–200)
HDL: 75.8 mg/dL (ref >39.00)
LDL Cholesterol: 69 mg/dL (ref 0–99)
NonHDL: 89.97
TRIGLYCERIDES: 105 mg/dL (ref 0.0–149.0)
VLDL: 21 mg/dL (ref 0.0–40.0)

## 2018-02-22 LAB — TSH: TSH: 1.89 u[IU]/mL (ref 0.35–4.50)

## 2018-02-22 MED ORDER — CEPHALEXIN 500 MG PO CAPS: 500.0000 mg | ORAL_CAPSULE | Freq: Three times a day (TID) | ORAL | 0 refills | Status: DC

## 2018-02-22 MED ORDER — METOPROLOL SUCCINATE ER 25 MG PO TB24: 25.0000 mg | ORAL_TABLET | Freq: Every day | ORAL | 3 refills | Status: DC

## 2018-02-22 NOTE — Assessment & Plan Note (Signed)
dexa up to date Taking vitamin d Walking for exercise

## 2018-02-22 NOTE — Assessment & Plan Note (Signed)
Follows with Dr Dorris Carnes Taking metoprolol daily - still has palpitations and it does cause some anxiety Fairly controlled

## 2018-02-22 NOTE — Assessment & Plan Note (Signed)
Monitored by Dr Cruzita Lederer

## 2018-02-22 NOTE — Assessment & Plan Note (Signed)
Yesterday after eating spinach Symptoms improving.  Has some mild spasm in RUQ and back -improved Likely related to GI bug or eating too much spinach Symptoms improving so no need for additional testing Symptomatic treatment

## 2018-02-22 NOTE — Assessment & Plan Note (Signed)
Check lipid panel  Continue daily statin Regular exercise and healthy diet encouraged  

## 2018-02-22 NOTE — Assessment & Plan Note (Signed)
Right index finger - mild She partially treated it with amoxicillin she had at home Keflex 500 mg TID x 5 days

## 2018-03-20 ENCOUNTER — Telehealth: Payer: Self-pay | Admitting: Internal Medicine

## 2018-03-20 ENCOUNTER — Telehealth: Payer: Self-pay | Admitting: Emergency Medicine

## 2018-03-20 MED ORDER — CEPHALEXIN 500 MG PO CAPS: 500.0000 mg | ORAL_CAPSULE | Freq: Three times a day (TID) | ORAL | 0 refills | Status: DC

## 2018-03-20 NOTE — Telephone Encounter (Signed)
Copied from [facility] [id number]. Topic: Inquiry >> Mar 20, 2018  8:25 AM Scherrie Gerlach wrote: Reason for CRM: pt states Dr Quay Burow put her on cephALEXin (KEFLEX) 500 MG capsule Pt states her finger is improved, but it is still red and not as sore. Pt states she finished Rx yesterday, but it was only a 5 day and she thinks typically 7-10 days Pt wants to know if the dr thinks she could benefit from another round of abx? Pt would like a call back [facility] [address] [place]

## 2018-03-20 NOTE — Telephone Encounter (Signed)
Forwarded labs to Dr Harrington Challenger per patients request. She was concerned with her cholesterol and her recent decrease in her statin.

## 2018-03-20 NOTE — Telephone Encounter (Signed)
New message  Pt verbalized that she is calling for RN  Pt stated that in March pt had her labs done by PCP and   She wants Dr.ross to review to see if any medications need to be changed

## 2018-03-20 NOTE — Telephone Encounter (Signed)
LVM informing pt

## 2018-03-20 NOTE — Telephone Encounter (Signed)
Reviewed labs drawn on 3/8. They look good   Lipids are excellent    Keep on same meds

## 2018-03-20 NOTE — Telephone Encounter (Signed)
Please advise 

## 2018-03-20 NOTE — Telephone Encounter (Signed)
Her infection was very mild so typically it does not require a long course.  We can try another 5 days (sent) if not better she may need to see ortho.

## 2018-03-21 MED ORDER — ROSUVASTATIN CALCIUM 10 MG PO TABS: 5.0000 mg | ORAL_TABLET | Freq: Every day | ORAL | 3 refills | Status: DC

## 2018-03-21 NOTE — Telephone Encounter (Signed)
From Dr. Harrington Challenger:  Reviewed labs drawn on 3/8. They look good   Lipids are excellent    Keep on same meds     I called patient and informed.  She currently is taking rosuvastatin 5 mg every day.  Medicine list updated to reflect this. Pt has annual visit with PR on 04/26/18.

## 2018-03-21 NOTE — Addendum Note (Signed)
Addended by: Rodman Key on: 03/21/2018 12:51 PM   Modules accepted: Orders

## 2018-04-09 ENCOUNTER — Telehealth: Payer: Self-pay | Admitting: Internal Medicine

## 2018-04-09 ENCOUNTER — Ambulatory Visit: Payer: Self-pay | Admitting: *Deleted

## 2018-04-09 NOTE — Telephone Encounter (Signed)
Spoke to patient. Scheduled appt for 04/10/18.

## 2018-04-09 NOTE — Telephone Encounter (Signed)
Patient stated she had an ultrasound done in Dec and her thyroid was down. She stated that her symptoms have started again Tenderness around the front of neck, and pain.  She has been taking Advil but would like to know what she should do.   Please advise

## 2018-04-09 NOTE — Telephone Encounter (Signed)
Appt  Made  For tommorow  With  Dr Jenny Reichmann   Pt  Advised  To  Call back if  Any throat  Swelling or  resp  Distress

## 2018-04-09 NOTE — Telephone Encounter (Signed)
Pt  Reports  Neck  Tenderness  Tender  To   Touch  r  Side  Pt feels slight  Discomfort  When  Swallowing   Pt  Reports   No  Problems   Taking  Liquids  Or  Foods    No  Swelling   No fatigue   No  Usual   Tachycardia  The se  Symptoms  Similar  To  The  Symptoms  She  Had  When  She  Was  Diagnosed  With  thyrioditis

## 2018-04-10 ENCOUNTER — Encounter: Payer: Self-pay | Admitting: Internal Medicine

## 2018-04-10 ENCOUNTER — Ambulatory Visit: Payer: BLUE CROSS/BLUE SHIELD | Admitting: Internal Medicine

## 2018-04-10 ENCOUNTER — Ambulatory Visit (INDEPENDENT_AMBULATORY_CARE_PROVIDER_SITE_OTHER): Payer: BLUE CROSS/BLUE SHIELD | Admitting: Internal Medicine

## 2018-04-10 VITALS — BP 132/80 | HR 58 | Ht 66.0 in | Wt 143.2 lb

## 2018-04-10 DIAGNOSIS — E042 Nontoxic multinodular goiter: Secondary | ICD-10-CM | POA: Diagnosis not present

## 2018-04-10 DIAGNOSIS — M542 Cervicalgia: Secondary | ICD-10-CM | POA: Insufficient documentation

## 2018-04-10 NOTE — Progress Notes (Signed)
Patient ID: Tiffany Moran, female   DOB: 19-Dec-1955, 62 y.o.   MRN: [id number]   HPI  Tiffany Moran is a 62 y.o.-year-old female, returning for follow-up for thyroid nodules.  Last visit 3 months ago.    She scheduled this appointment yesterday, and she complains of tenderness in the R anterior neck, not resolved by Advil.  The symptoms started again 4 days ago, but she had similar sxs in L ant neck at the beginning of the mo, resolved in few days. She was recently on ABx (Cephalexin) for a rash.  Otherwise, she denies: - feeling nodules in neck - hoarseness - dysphagia - choking - SOB with lying down  Reviewed and addended history She describes that she had a "thyroid storm" episode >20 years ago.  In 10/2016, she noticed pain with swallowing. She took Advil >> not helping >> worse >> saw PCP >> TFTs normal. She also had a thyroid U/S >> small thyroid nodules, possibly related to her previous episode of thyroiditis.  Thyroid U/S (11/20/2016):  Isthmus: Measures 0.4 cm in thickness. Small hypoechoic structure just superior to the isthmus measures 0.3 cm. This may represent a small node.  Right lobe: Measures 4.1 x 1.7 x 1.7 cm. Right thyroid tissue is diffusely heterogeneous without a discrete nodule. Small lymph nodes on the right side of the neck.  Left lobe: Measures 5.0 x 1.3 x 1.7 cm. Nodule # 1: Location: Left; Superior Size: 0.7 x 0.4 x 0.6 cm. Composition: solid/almost completely solid (2) Echogenicity: cannot determine (1) ACR TI-RADS total points: 3.  Nodule # 2: Location: Left; Mid Size: 1.2 x 0.6 x 0.7 cm. Composition: solid/almost completely solid (2) Echogenic foci: punctate echogenic foci (3), questionable ACR TI-RADS total points: 6.  Nodule # 3: Location: Left; Inferior Size: 0.6 x 0.4 x 0.3 cm. Composition: solid/almost completely solid (2) Echogenicity: hypoechoic (2) Margins: extra-thyroidal extension (3) Echogenic foci: punctate echogenic foci (3) ACR  TI-RADS total points: 10.  Small lymph nodes on L side of the neck.  Thyroid U/S (12/04/2017):  Isthmus: 0.2 cm, previously 0.4 cm  Right lobe: 3.8 x 1.0 x 1.2 cm, previously 4.1 x 1.7 x 1.7 cm  Left lobe: 4.9 x 1.2 x 1.6 cm, previously 4.9 x 1.3 x 1.7 cm  Nodule # 1: Prior biopsy: No Location: Left; Superior Maximum size: 0.6 cm; Other 2 dimensions: 0.6 x 0.5 cm, previously, 0.7 x 0.4 x 0.6 cm Composition: solid/almost completely solid (2) Echogenicity: hypoechoic (2) Echogenic foci: punctate echogenic foci (3) ACR TI-RADS total points: 7.  Nodule # 2: Prior biopsy: No Location: Left; Mid Maximum size: 1.3 cm; Other 2 dimensions: 0.9 x 0.6 cm, previously, 1.2 x 0.7 x 0.6 cm Composition: solid/almost completely solid (2) Echogenicity: isoechoic (1) Echogenic foci: punctate echogenic foci (3) ACR TI-RADS total points: 6.  IMPRESSION: Left nodules 1 and 2 are not significantly changed and meet criteria for annual follow-up. Left nodule 3 seen previously was not appreciated today.  No significant change in the left superior and mid nodules; left inferior nodule not seen this time.   One of the nodules was described as possible parathyroid enlargement, however, patient's calcium levels have been normal: Lab Results  Component Value Date   CALCIUM 9.9 02/22/2018   CALCIUM 9.8 02/21/2017   CALCIUM 9.7 08/28/2016   CALCIUM 9.8 02/14/2016   CALCIUM 9.8 05/07/2015   I reviewed pt's thyroid tests - normal: Lab Results  Component Value Date   TSH 1.89 02/22/2018  TSH 2.32 02/21/2017   TSH 1.09 11/08/2016   TSH 1.65 02/14/2016   TSH 1.823 05/25/2015   TSH 2.309 05/19/2014   TSH 1.79 01/30/2014   TSH 1.63 12/25/2012   TSH 1.87 12/11/2011   TSH 1.35 01/05/2010   FREET4 1.01 11/08/2016   FREET4 0.9 01/05/2010    Thyroid Ab's negative: Component     Latest Ref Rng & Units 12/01/2016  Thyroperoxidase Ab SerPl-aCnc     <9 IU/mL 2  Thyroglobulin Ab     <2 IU/mL  <1   + FH of thyroid ds.: both parents - hypothyroidism. No FH of thyroid cancer. No h/o radiation tx to head or neck.  No seaweed or kelp. No recent contrast studies. No herbal supplements. No Biotin use. No recent steroids use.   FH of MG in father.  ROS: Constitutional: no weight gain/no weight loss, no fatigue, no subjective hyperthermia, no subjective hypothermia Eyes: no blurry vision, no xerophthalmia ENT: no sore throat, + see HPI Cardiovascular: no CP/no SOB/no palpitations/no leg swelling Respiratory: no cough/no SOB/no wheezing Gastrointestinal: no N/no V/no D/no C/no acid reflux Musculoskeletal: no muscle aches/no joint aches, + see HPI Skin: no rashes, no hair loss Neurological: + tremors/no numbness/no tingling/no dizziness  I reviewed pt's medications, allergies, PMH, social hx, family hx, and changes were documented in the history of present illness. Otherwise, unchanged from my initial visit note.  Past Medical History:  Diagnosis Date  . Cardiospasm 1983   felt may be related to MVP  . Hyperlipidemia   . Kidney cysts 11/2015   cyst on one of her kidneys, no treatment required  . Mitral valve prolapse   . PONV (postoperative nausea and vomiting)    teeth chipped  . Raynaud disease   . Retinal hole of right eye   . Shingles 2012   leg  . Tachycardia    Past Surgical History:  Procedure Laterality Date  . COLONOSCOPY  2007   [place], [place] ,[name]  . DILATATION & CURETTAGE/HYSTEROSCOPY WITH MYOSURE N/A 08/29/2016   Procedure: DILATATION & CURETTAGE/HYSTEROSCOPY;  Surgeon: Salvadore Dom, MD;  Location: [facility];  Service: Gynecology;  Laterality: N/A;  . DILATION AND CURETTAGE OF UTERUS    . EXCISION OF SKIN TAG Left 08/29/2016   Procedure: EXCISION OF SKIN TAG left labia majora;  Surgeon: Salvadore Dom, MD;  Location: [facility];  Service: Gynecology;  Laterality: Left;  . EXPLORATORY LAPAROTOMY     X2 for pain: fibroid, enlarged oviduct  . RETINAL  LASER PROCEDURE Right 08/2015  . SALPINGECTOMY  2007 or 2008   and fibroid removed  . TUBAL LIGATION     Social History   Social History  . Marital status: Married    Spouse name: N/A  . Number of children: 2   Occupational History  . Retired Therapist, sports   Social History Main Topics  . Smoking status: Never Smoker  . Smokeless tobacco: Never Used  . Alcohol use No  . Drug use: No  . Sexual activity: Yes    Birth control/ protection: Other-see comments, Post-menopausal     Comment: BTL   Current Outpatient Medications on File Prior to Visit  Medication Sig Dispense Refill  . ALPRAZolam (XANAX) 0.5 MG tablet Take 1 tablet (0.5 mg total) by mouth every 8 (eight) hours as needed for anxiety. 15 tablet 0  . aspirin EC 81 MG tablet Take 1 tablet (81 mg total) by mouth daily. 90 tablet 3  . cephALEXin (KEFLEX) 500  MG capsule Take 1 capsule (500 mg total) by mouth 3 (three) times daily. 15 capsule 0  . Cholecalciferol (VITAMIN D) 2000 units CAPS Take 2,000 Units by mouth daily.    Marland Kitchen estradiol (ESTRACE VAGINAL) 0.1 MG/GM vaginal cream Use 1/2 gm intravaginally twice a week. 42.5 g 3  . metoprolol succinate (TOPROL-XL) 25 MG 24 hr tablet Take 1 tablet (25 mg total) by mouth daily. 90 tablet 3  . rosuvastatin (CRESTOR) 10 MG tablet Take 0.5 tablets (5 mg total) by mouth daily. 90 tablet 3   No current facility-administered medications on file prior to visit.    Allergies  Allergen Reactions  . Lipitor [Atorvastatin] Other (See Comments)    Did not tolerate   Family History  Problem Relation Age of Onset  . Heart attack Father 58  . Myasthenia gravis Father   . Hypertension Father   . Hyperlipidemia Father   . Osteoporosis Father        due to steroids for Myasthenia Grvis  . Leukemia Mother 67  . Hyperlipidemia Mother   . Hypertension Mother   . Hypothyroidism Mother   . Prostate cancer Brother        in 62s  . Hyperlipidemia Brother   . Stroke Maternal Grandfather        in 4s   . Diabetes Neg Hx    PE: BP 132/80   Pulse (!) 58   Ht 5\' 6"  (1.676 m)   Wt 143 lb 3.2 oz (65 kg)   LMP 06/17/2009 (Approximate)   SpO2 99%   BMI 23.11 kg/m  Wt Readings from Last 3 Encounters:  04/10/18 143 lb 3.2 oz (65 kg)  02/22/18 143 lb (64.9 kg)  12/03/17 142 lb 9.6 oz (64.7 kg)   Constitutional:  Normal weight, in NAD Eyes: PERRLA, EOMI, no exophthalmos ENT: moist mucous membranes, no thyromegaly, no cervical lymphadenopathy; discomfort at palpation of low lateral neck, no mass palpated: R ear canal erythematous, tympanic mb clear, no fluid visible in middle ear Cardiovascular: RRR, No MRG Respiratory: CTA B Gastrointestinal: abdomen soft, NT, ND, BS+ Musculoskeletal: no deformities, strength intact in all 4 Skin: moist, warm, + periungual rash B Neurological: + mild tremor with outstretched hands, DTR normal in all 4  ASSESSMENT: 1. Multiple thyroid nodules  - she does not have a family history of thyroid cancer history of radiation therapy to her head or neck.  Therefore, her risk of thyroid cancer is not increased.  2. Low neck pain  PLAN: 1. Multiple thyroid nodules - we reviewed the reports of her 2017 in 2018 thyroid ultrasounds.  The dominant nodules are small and with a pseudo-nodule appearance (inflammatory nodules).  They are not hypoechoic, without microcalcifications, and with colloid granules, without internal blood flow and they are more wide than tall.  They were stable in size between the 2 ultrasounds, except one of the nodules seen on the 2017 U/S was not visible in 2018. - Reviewed thyroid antibodies obtained in 11/2016 and these were not elevated to indicate Hashimoto's thyroiditis. - reviewed previous TFTs and they were normal since 2011 including the last check in 02/2018.  I do not feel we need to repeat these now - At last visit, we discussed to let me know if she develops neck compression symptoms  2. Low neck pain - She called [place] yesterday  about anterior neck pain, unrelieved by Advil. - I cannot feel a mass on palpation of her neck and the location of the  pain appears to be lateral from the thyroid. She mentions that the pain can also be felt higher in the neck >> ? SCM pain or pain referred from ear.  - discussed to continue Advil for now if sxs bothering her, but if not improved in another week, may need to see PCP for this.  - time spent with the patient: 15 min, of which >50% was spent in obtaining information about her symptoms, reviewing her previous labs, evaluations, and treatments, counseling her about her condition (please see the discussed topics above)  Philemon Kingdom, MD PhD [facility]

## 2018-04-10 NOTE — Patient Instructions (Signed)
Please come back for a follow-up appointment in 1 year.  

## 2018-04-16 ENCOUNTER — Encounter: Payer: Self-pay | Admitting: Internal Medicine

## 2018-04-17 ENCOUNTER — Encounter: Payer: Self-pay | Admitting: Internal Medicine

## 2018-04-17 ENCOUNTER — Ambulatory Visit (INDEPENDENT_AMBULATORY_CARE_PROVIDER_SITE_OTHER): Payer: BLUE CROSS/BLUE SHIELD | Admitting: Internal Medicine

## 2018-04-17 VITALS — BP 126/88 | HR 93 | Temp 98.1°F | Ht 66.0 in | Wt 142.0 lb

## 2018-04-17 DIAGNOSIS — J069 Acute upper respiratory infection, unspecified: Secondary | ICD-10-CM | POA: Diagnosis not present

## 2018-04-17 DIAGNOSIS — M19041 Primary osteoarthritis, right hand: Secondary | ICD-10-CM | POA: Diagnosis not present

## 2018-04-17 DIAGNOSIS — H6981 Other specified disorders of Eustachian tube, right ear: Secondary | ICD-10-CM | POA: Diagnosis not present

## 2018-04-17 DIAGNOSIS — M19049 Primary osteoarthritis, unspecified hand: Secondary | ICD-10-CM | POA: Insufficient documentation

## 2018-04-17 MED ORDER — DOXYCYCLINE HYCLATE 100 MG PO TABS: 100.0000 mg | ORAL_TABLET | Freq: Two times a day (BID) | ORAL | 0 refills | Status: DC

## 2018-04-17 NOTE — Progress Notes (Addendum)
Subjective:    Patient ID: Tiffany Moran, female    DOB: 12-08-1956, 62 y.o.   MRN: [id number]  HPI  Here with 4-5 days acute onset fever, facial pain, pressure, headache, general weakness and malaise, and greenish d/c, with mild ST and severe cough that keeps her up at night as well as bilat right > left eustachian tube symptoms with popping and crackling, but pt denies chest pain, wheezing, increased sob or doe, orthopnea, PND, increased LE swelling, palpitations, dizziness or syncope.  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  Prior index finger cellulitis resolved  No other new complaints or interval hx  Addendum 08/14/18:  Pt now adamantly states she did not have fever, URI symptoms HA, weakness, malaise or discharge, ST or cough.  Pt would like hx amended to state she complained of right ear and neck discomfort. Past Medical History:  Diagnosis Date  . Cardiospasm 1983   felt may be related to MVP  . Hyperlipidemia   . Kidney cysts 11/2015   cyst on one of her kidneys, no treatment required  . Mitral valve prolapse   . PONV (postoperative nausea and vomiting)    teeth chipped  . Raynaud disease   . Retinal hole of right eye   . Shingles 2012   leg  . Tachycardia    Past Surgical History:  Procedure Laterality Date  . COLONOSCOPY  2007   [place], [place] ,[name]  . DILATATION & CURETTAGE/HYSTEROSCOPY WITH MYOSURE N/A 08/29/2016   Procedure: DILATATION & CURETTAGE/HYSTEROSCOPY;  Surgeon: Salvadore Dom, MD;  Location: [facility];  Service: Gynecology;  Laterality: N/A;  . DILATION AND CURETTAGE OF UTERUS    . EXCISION OF SKIN TAG Left 08/29/2016   Procedure: EXCISION OF SKIN TAG left labia majora;  Surgeon: Salvadore Dom, MD;  Location: [facility];  Service: Gynecology;  Laterality: Left;  . EXPLORATORY LAPAROTOMY     X2 for pain: fibroid, enlarged oviduct  . RETINAL LASER PROCEDURE Right  08/2015  . SALPINGECTOMY  2007 or 2008   and fibroid removed  . TUBAL LIGATION      reports that she has never smoked. She has never used smokeless tobacco. She reports that she does not drink alcohol or use drugs. family history includes Heart attack (age of onset: 8) in her father; Hyperlipidemia in her brother, father, and mother; Hypertension in her father and mother; Hypothyroidism in her mother; Leukemia (age of onset: 33) in her mother; Myasthenia gravis in her father; Osteoporosis in her father; Prostate cancer in her brother; Stroke in her maternal grandfather. Allergies  Allergen Reactions  . Lipitor [Atorvastatin] Other (See Comments)    Did not tolerate   Current Outpatient Medications on File Prior to Visit  Medication Sig Dispense Refill  . ALPRAZolam (XANAX) 0.5 MG tablet Take 0.5 mg by mouth at bedtime as needed for anxiety.    Marland Kitchen aspirin EC 81 MG tablet Take 1 tablet (81 mg total) by mouth daily. 90 tablet 3  . Cholecalciferol (VITAMIN D) 2000 units CAPS Take 2,000 Units by mouth daily.    . metoprolol succinate (TOPROL-XL) 25 MG 24 hr tablet Take 1 tablet (25 mg total) by mouth daily. 90 tablet 3  . Multiple Vitamins-Minerals (EYE VITAMINS PO) Take 1 tablet by mouth daily.    . rosuvastatin (CRESTOR) 5 MG tablet Take 5 mg by mouth daily.  No current facility-administered medications on file prior to visit.    Review of Systems  Constitutional: Negative for other unusual diaphoresis or sweats HENT: Negative for ear discharge or swelling Eyes: Negative for other worsening visual disturbances Respiratory: Negative for stridor or other swelling  Gastrointestinal: Negative for worsening distension or other blood Genitourinary: Negative for retention or other urinary change Musculoskeletal: Negative for other MSK pain or swelling Skin: Negative for color change or other new lesions Neurological: Negative for worsening tremors and other numbness  Psychiatric/Behavioral:  Negative for worsening agitation or other fatigue All other system neg per pt    Objective:   Physical Exam BP 126/88   Pulse 93   Temp 98.1 F (36.7 C) (Oral)   Ht 5\' 6"  (1.676 m)   Wt 142 lb (64.4 kg)   LMP 06/17/2009 (Approximate)   SpO2 99%   BMI 22.92 kg/m  VS noted, mild ill appearing Constitutional: Pt appears in NAD HENT: Head: NCAT.  Right Ear: External ear normal.  Left Ear: External ear normal.  Bilat tm's with mild erythema.  Max sinus areas mild tender.  Pharynx with mild erythema, no exudate Eyes: . Pupils are equal, round, and reactive to light. Conjunctivae and EOM are normal Nose: without d/c or deformity Neck: Neck supple. Gross normal ROM Cardiovascular: Normal rate and regular rhythm.   Pulmonary/Chest: Effort normal and breath sounds without rales or wheezing.  Right hand with mild OA changes to DIPs of index and middle fingers with slight bony enlargment, NT, no erythema, Neurological: Pt is alert. At baseline orientation, motor grossly intact Skin: Skin is warm. No rashes, other new lesions, no LE edema Psychiatric: Pt behavior is normal without agitation , mild nervous No other exam findings       Assessment & Plan:  Addendum:

## 2018-04-17 NOTE — Patient Instructions (Signed)
Please take all new medication as prescribed - the antibiotic  You can also take Mucinex (or it's generic off brand) for congestion, and tylenol as needed for pain.  Please continue all other medications as before, and refills have been done if requested.  Please have the pharmacy call with any other refills you may need.  Please keep your appointments with your specialists as you may have planned   

## 2018-04-17 NOTE — Assessment & Plan Note (Signed)
Helen for mucinex otc prn,  to f/u any worsening symptoms or concerns

## 2018-04-17 NOTE — Assessment & Plan Note (Signed)
Mild to mod, for antibx course,  to f/u any worsening symptoms or concerns 

## 2018-04-17 NOTE — Assessment & Plan Note (Signed)
D/w pt, very mild, declines volt gel prn,  to f/u any worsening symptoms or concerns

## 2018-04-26 ENCOUNTER — Ambulatory Visit (INDEPENDENT_AMBULATORY_CARE_PROVIDER_SITE_OTHER): Payer: BLUE CROSS/BLUE SHIELD | Admitting: Internal Medicine

## 2018-04-26 ENCOUNTER — Encounter: Payer: Self-pay | Admitting: Internal Medicine

## 2018-04-26 VITALS — BP 122/68 | HR 71 | Ht 66.0 in | Wt 144.8 lb

## 2018-04-26 DIAGNOSIS — I2510 Atherosclerotic heart disease of native coronary artery without angina pectoris: Secondary | ICD-10-CM

## 2018-04-26 DIAGNOSIS — E785 Hyperlipidemia, unspecified: Secondary | ICD-10-CM

## 2018-04-26 DIAGNOSIS — R002 Palpitations: Secondary | ICD-10-CM | POA: Diagnosis not present

## 2018-04-26 NOTE — Patient Instructions (Signed)
Your physician recommends that you continue on your current medications as directed. Please refer to the Current Medication list given to you today. Your physician wants you to follow-up in: 1 year with Dr. Ross.  You will receive a reminder letter in the mail two months in advance. If you don't receive a letter, please call our office to schedule the follow-up appointment.  

## 2018-04-26 NOTE — Progress Notes (Signed)
Cardiology Office Note   Date:  04/26/2018   ID:  Tiffany Moran, DOB 09-06-1956, MRN [id number]  PCP:  Binnie Rail, MD  Cardiologist:   Dorris Carnes, MD   F/U of palpitations and mild CAD     History of Present Illness: Tiffany Moran is a 62 y.o. female with a history of palpitations  Also a history of abnormal myoview  Cardiac CT showed mild nonobstructive CAD  I saw her in clinic 1 year ago  (May 2018)  Since seen she notes occasional palpitations They are isolated skips   Coughs or drinks ice water and goes away   Very worried that she wont be able to control   Has some cramping in L Leg   Asks if it is Homan's sign  Complains of being tired a lot  QUestions if due to toprol  Does not exercise       Outpatient Medications Prior to Visit  Medication Sig Dispense Refill  . ALPRAZolam (XANAX) 0.5 MG tablet Take 0.5 mg by mouth at bedtime as needed for anxiety.    Marland Kitchen aspirin EC 81 MG tablet Take 1 tablet (81 mg total) by mouth daily. 90 tablet 3  . Cholecalciferol (VITAMIN D) 2000 units CAPS Take 2,000 Units by mouth daily.    Marland Kitchen doxycycline (VIBRA-TABS) 100 MG tablet Take 1 tablet (100 mg total) by mouth 2 (two) times daily. 20 tablet 0  . metoprolol succinate (TOPROL-XL) 25 MG 24 hr tablet Take 1 tablet (25 mg total) by mouth daily. 90 tablet 3  . Multiple Vitamins-Minerals (EYE VITAMINS PO) Take 1 tablet by mouth daily.    . rosuvastatin (CRESTOR) 5 MG tablet Take 5 mg by mouth daily.     No facility-administered medications prior to visit.      Allergies:   Lipitor [atorvastatin]   Past Medical History:  Diagnosis Date  . Cardiospasm 1983   felt may be related to MVP  . Hyperlipidemia   . Kidney cysts 11/2015   cyst on one of her kidneys, no treatment required  . Mitral valve prolapse   . PONV (postoperative nausea and vomiting)    teeth chipped  . Raynaud disease   . Retinal hole of right eye   . Shingles 2012   leg  . Tachycardia     Past Surgical  History:  Procedure Laterality Date  . COLONOSCOPY  2007   [place], [place] ,[name]  . DILATATION & CURETTAGE/HYSTEROSCOPY WITH MYOSURE N/A 08/29/2016   Procedure: DILATATION & CURETTAGE/HYSTEROSCOPY;  Surgeon: Salvadore Dom, MD;  Location: [facility];  Service: Gynecology;  Laterality: N/A;  . DILATION AND CURETTAGE OF UTERUS    . EXCISION OF SKIN TAG Left 08/29/2016   Procedure: EXCISION OF SKIN TAG left labia majora;  Surgeon: Salvadore Dom, MD;  Location: [facility];  Service: Gynecology;  Laterality: Left;  . EXPLORATORY LAPAROTOMY     X2 for pain: fibroid, enlarged oviduct  . RETINAL LASER PROCEDURE Right 08/2015  . SALPINGECTOMY  2007 or 2008   and fibroid removed  . TUBAL LIGATION       Social History:  The patient  reports that she has never smoked. She has never used smokeless tobacco. She reports that she does not drink alcohol or use drugs.   Family History:  The patient's family history includes Heart attack (age of onset: 90) in her father; Hyperlipidemia in her brother, father, and mother; Hypertension in her father and mother; Hypothyroidism in her mother;  Leukemia (age of onset: 110) in her mother; Myasthenia gravis in her father; Osteoporosis in her father; Prostate cancer in her brother; Stroke in her maternal grandfather.    ROS:  Please see the history of present illness. All other systems are reviewed and  Negative to the above problem except as noted.    PHYSICAL EXAM: VS:  BP 122/68   Pulse 71   Ht 5\' 6"  (1.676 m)   Wt 144 lb 12.8 oz (65.7 kg)   LMP 06/17/2009 (Approximate)   BMI 23.37 kg/m   GEN: Well nourished, well developed, in no acute distress  HEENT: normal  Neck: JVP is norrmal carotid bruits, or masses Cardiac: RRR; no murmurs, rubs, or gallops,no edema  Respiratory:  clear to auscultation bilaterally, normal work of breathing GI: soft, nontender, nondistended, + BS  No hepatomegaly  MS: no deformity Moving all extremities   Skin: warm and dry,  no rash Neuro:  Strength and sensation are intact Psych: euthymic mood, full affect   EKG:  EKG is ordered today.  SR 71 bpm    Lipid Panel    Component Value Date/Time   CHOL 166 02/22/2018 0925   CHOL 168 09/25/2017 0806   TRIG 105.0 02/22/2018 0925   HDL 75.80 02/22/2018 0925   HDL 72 09/25/2017 0806   CHOLHDL 2 02/22/2018 0925   VLDL 21.0 02/22/2018 0925   LDLCALC 69 02/22/2018 0925   LDLCALC 79 09/25/2017 0806      Wt Readings from Last 3 Encounters:  04/26/18 144 lb 12.8 oz (65.7 kg)  04/17/18 142 lb (64.4 kg)  04/10/18 143 lb 3.2 oz (65 kg)      ASSESSMENT AND PLAN:  Pt very anxious overall  1  CAD  Very mild on CT  No symptoms of angina  Encouraged her to start walking some   I do not think she needs to take ASA      2.  HL LIpids are excellent on current regimen  LDL 49  HDL 75    3  Hx SVT   I do not think what she is experiencing is SVT   Follow    4  L Leg swelling  Legs ae OK   F/U in1 eyr    Signed, Dorris Carnes, MD  04/26/2018 8:23 AM    [facility] [place], [place],   [address] Phone: [phone]; Fax: [phone]

## 2018-05-30 DIAGNOSIS — H35371 Puckering of macula, right eye: Secondary | ICD-10-CM | POA: Diagnosis not present

## 2018-05-30 DIAGNOSIS — H40013 Open angle with borderline findings, low risk, bilateral: Secondary | ICD-10-CM | POA: Diagnosis not present

## 2018-05-30 DIAGNOSIS — H2513 Age-related nuclear cataract, bilateral: Secondary | ICD-10-CM | POA: Diagnosis not present

## 2018-05-30 DIAGNOSIS — H04123 Dry eye syndrome of bilateral lacrimal glands: Secondary | ICD-10-CM | POA: Diagnosis not present

## 2018-06-11 ENCOUNTER — Ambulatory Visit: Payer: BLUE CROSS/BLUE SHIELD | Admitting: Nurse Practitioner

## 2018-06-13 ENCOUNTER — Other Ambulatory Visit (HOSPITAL_COMMUNITY)
Admission: RE | Admit: 2018-06-13 | Discharge: 2018-06-13 | Disposition: A | Discharge status: Home / Self Care | Payer: BLUE CROSS/BLUE SHIELD | Source: Ambulatory Visit | Attending: Obstetrics & Gynecology | Admitting: Obstetrics & Gynecology

## 2018-06-13 ENCOUNTER — Encounter: Payer: Self-pay | Admitting: Obstetrics & Gynecology

## 2018-06-13 ENCOUNTER — Other Ambulatory Visit: Payer: Self-pay

## 2018-06-13 ENCOUNTER — Ambulatory Visit (INDEPENDENT_AMBULATORY_CARE_PROVIDER_SITE_OTHER): Payer: BLUE CROSS/BLUE SHIELD | Admitting: Obstetrics & Gynecology

## 2018-06-13 VITALS — BP 122/64 | HR 72 | Resp 14 | Ht 65.75 in | Wt 141.0 lb

## 2018-06-13 DIAGNOSIS — Z124 Encounter for screening for malignant neoplasm of cervix: Secondary | ICD-10-CM

## 2018-06-13 DIAGNOSIS — Z01419 Encounter for gynecological examination (general) (routine) without abnormal findings: Secondary | ICD-10-CM | POA: Diagnosis not present

## 2018-06-13 DIAGNOSIS — Z8679 Personal history of other diseases of the circulatory system: Secondary | ICD-10-CM

## 2018-06-13 MED ORDER — NONFORMULARY OR COMPOUNDED ITEM: 3 refills | Status: DC

## 2018-06-13 NOTE — Progress Notes (Signed)
62 y.o. [id number] MarriedCaucasianF here for annual exam.  Denies vaginal bleeding.  Son moved back from [place].  He worked with [employer].    Endocrinology:  Dr Cruzita Lederer PCP:  Dr. Quay Burow  Patient's last menstrual period was 06/17/2009 (approximate).          Sexually active: Yes.    The current method of family planning is post menopausal status.    Exercising: Yes.    occ Smoker:  no  Health Maintenance: Pap:  06/04/17 Neg. HR HPV:Neg   05/29/16 ASCUS. HR HPV:Neg  History of abnormal Pap:  yes MMG:  06/15/17 BIRADS1:Neg  Colonoscopy:  12/10/15 Normal. F/u 10 years  BMD:   06/15/17, ostoepenia TDaP:  2012 Pneumonia vaccine(s):  n/a Shingrix: declines considering this Hep C testing: 05/29/16 neg  Screening Labs: done with PCP   reports that she has never smoked. She has never used smokeless tobacco. She reports that she does not drink alcohol or use drugs.  Past Medical History:  Diagnosis Date  . Cardiospasm 1983   felt may be related to MVP  . Hyperlipidemia   . Kidney cysts 11/2015   cyst on one of her kidneys, no treatment required  . Mitral valve prolapse   . PONV (postoperative nausea and vomiting)    teeth chipped  . Raynaud disease   . Retinal hole of right eye   . Shingles 2012   leg  . Tachycardia     Past Surgical History:  Procedure Laterality Date  . COLONOSCOPY  2007   [place], [place] ,[name]  . DILATATION & CURETTAGE/HYSTEROSCOPY WITH MYOSURE N/A 08/29/2016   Procedure: DILATATION & CURETTAGE/HYSTEROSCOPY;  Surgeon: Salvadore Dom, MD;  Location: [facility];  Service: Gynecology;  Laterality: N/A;  . DILATION AND CURETTAGE OF UTERUS    . EXCISION OF SKIN TAG Left 08/29/2016   Procedure: EXCISION OF SKIN TAG left labia majora;  Surgeon: Salvadore Dom, MD;  Location: [facility];  Service: Gynecology;  Laterality: Left;  . EXPLORATORY LAPAROTOMY     X2 for pain: fibroid, enlarged oviduct  . RETINAL LASER PROCEDURE Right 08/2015  . SALPINGECTOMY  2007  or 2008   and fibroid removed  . TUBAL LIGATION      Current Outpatient Medications  Medication Sig Dispense Refill  . Cholecalciferol (VITAMIN D) 2000 units CAPS Take 2,000 Units by mouth daily.    . metoprolol succinate (TOPROL-XL) 25 MG 24 hr tablet Take 1 tablet (25 mg total) by mouth daily. 90 tablet 3  . Multiple Vitamins-Minerals (EYE VITAMINS PO) Take 1 tablet by mouth daily.    . rosuvastatin (CRESTOR) 5 MG tablet Take 5 mg by mouth daily.    Marland Kitchen ALPRAZolam (XANAX) 0.5 MG tablet Take 0.5 mg by mouth at bedtime as needed for anxiety.     No current facility-administered medications for this visit.     Family History  Problem Relation Age of Onset  . Heart attack Father 29  . Myasthenia gravis Father   . Hypertension Father   . Hyperlipidemia Father   . Osteoporosis Father        due to steroids for Myasthenia Grvis  . Leukemia Mother 28  . Hyperlipidemia Mother   . Hypertension Mother   . Hypothyroidism Mother   . Prostate cancer Brother        in 46s  . Hyperlipidemia Brother   . Stroke Maternal Grandfather        in 3s  . Diabetes Neg Hx  Review of Systems  Constitutional: Negative.   HENT: Positive for ear pain.   Eyes: Negative.   Respiratory: Negative.   Cardiovascular: Negative.   Gastrointestinal: Negative.   Genitourinary: Negative.   Musculoskeletal: Negative.   Skin: Negative.   Neurological: Negative.   Psychiatric/Behavioral: Negative.   All other systems reviewed and are negative.   Exam:   BP 122/64 (BP Location: Right Arm, Patient Position: Sitting, Cuff Size: Normal)   Pulse 72   Resp 14   Ht 5' 5.75" (1.67 m)   Wt 141 lb (64 kg)   LMP 06/17/2009 (Approximate)   BMI 22.93 kg/m    Height: 5' 5.75" (167 cm)  Ht Readings from Last 3 Encounters:  06/13/18 5' 5.75" (1.67 m)  04/26/18 5\' 6"  (1.676 m)  04/17/18 5\' 6"  (1.676 m)    General appearance: alert, cooperative and appears stated age Head: Normocephalic, without obvious  abnormality, atraumatic Neck: no adenopathy, supple, symmetrical, trachea midline and thyroid normal to inspection and palpation Lungs: clear to auscultation bilaterally Breasts: normal appearance, no masses or tenderness Heart: regular rate and rhythm Abdomen: soft, non-tender; bowel sounds normal; no masses,  no organomegaly Extremities: extremities normal, atraumatic, no cyanosis or edema Skin: Skin color, texture, turgor normal. No rashes or lesions Lymph nodes: Cervical, supraclavicular, and axillary nodes normal. No abnormal inguinal nodes palpated Neurologic: Grossly normal   Pelvic: External genitalia:  no lesions              Urethra:  normal appearing urethra with no masses, tenderness or lesions              Bartholins and Skenes: normal                 Vagina: normal appearing vagina with normal color and discharge, no lesions              Cervix: no lesions              Pap taken: No. Bimanual Exam:  Uterus:  normal size, contour, position, consistency, mobility, non-tender              Adnexa: normal adnexa and no mass, fullness, tenderness               Rectovaginal: Confirms               Anus:  normal sphincter tone, no lesions  Chaperone was present for exam.  A:  Well Woman with normal exam PMP, no HRT Atrophic vaginitis H/o borderline MVP Elevated lipids Hypertension  P:   Mammogram guidelines reviewed.  Doing 3D MMG due to breast density pap smear obtained 2018 with neg HR HPV Lab work done with Dr. Quay Burow earlier this year Declines vaccinations today--shingles and does not get flu shots Trial of Vit E vaginal suppositories pv twice weekly.  #36/4RF Return annually or prn

## 2018-06-13 NOTE — Progress Notes (Signed)
Rx for vit E suppos[facility]

## 2018-06-14 LAB — CYTOLOGY - PAP: Diagnosis: NEGATIVE

## 2018-06-17 DIAGNOSIS — Z1231 Encounter for screening mammogram for malignant neoplasm of breast: Secondary | ICD-10-CM | POA: Diagnosis not present

## 2018-06-17 LAB — HM MAMMOGRAPHY

## 2018-06-24 ENCOUNTER — Other Ambulatory Visit: Payer: Self-pay | Admitting: Internal Medicine

## 2018-07-03 ENCOUNTER — Encounter: Payer: Self-pay | Admitting: Internal Medicine

## 2018-07-09 ENCOUNTER — Encounter: Payer: Self-pay | Admitting: Internal Medicine

## 2018-07-25 ENCOUNTER — Other Ambulatory Visit: Payer: Self-pay | Admitting: Internal Medicine

## 2018-07-25 NOTE — Telephone Encounter (Signed)
[place] Controlled Substance Database checked. Last filled on 10/01/18

## 2018-07-31 ENCOUNTER — Telehealth: Payer: Self-pay | Admitting: Internal Medicine

## 2018-07-31 NOTE — Telephone Encounter (Deleted)
New Message        Are you having a reaction (difficulty breathing--STAT)  4. What is your medication issue? ***

## 2018-07-31 NOTE — Telephone Encounter (Signed)
New Message        Pt c/o medication issue:  1. Name of Medication: Metoprolol 25 mg  2. How are you currently taking this medication (dosage and times per day)? Once a day  3. Are you having a reaction (difficulty breathing--STAT)? NO  4. What is your medication issue? Patient wants to know if she can increase medication or not because she is traveling. Pls advise

## 2018-07-31 NOTE — Telephone Encounter (Signed)
Patient will be going out of town and that usually makes her more anxious than normal and her PVCs worsen. Usually at home notices about 4 episodes a day at most.  Wants to know if ok to take 2nd Toprol XL 25 mg if starts to experience more PVCs while she is travelling. Advised that per Dr. Alan Ripper notes when pt wore EVENT MONITOR, that she recommended increasing Toprol XL 25 to BID but patient did not want to because she thought 1 Toprol XL was making her sleepy and 2 would make it worse.  She takes the daily dose at night. I advised if in the morning or during the day she senses more symptoms than normal she could take extra 1/2 tablet to see if helps. Also advised that it is ok to take whole tablet twice a day as was recommended previously.  Advised to monitor BP intermittently if goes up to BID.  Advised to call back or use MyChart to let Dr. Harrington Challenger know if she makes the change and how she is doing.  Dr. Linna Darner told her years ago to carry a copy of her EKG with her and so pt will come by to request copy of most recent EKG.

## 2018-08-08 ENCOUNTER — Telehealth: Payer: Self-pay | Admitting: Internal Medicine

## 2018-08-08 NOTE — Telephone Encounter (Signed)
Request for Amendment of Health Information received on August 08, 2018. Forwarded to Cathlean Cower MD to review.  daj

## 2018-08-16 DIAGNOSIS — H40013 Open angle with borderline findings, low risk, bilateral: Secondary | ICD-10-CM | POA: Diagnosis not present

## 2018-08-16 DIAGNOSIS — H2513 Age-related nuclear cataract, bilateral: Secondary | ICD-10-CM | POA: Diagnosis not present

## 2018-08-16 DIAGNOSIS — H04123 Dry eye syndrome of bilateral lacrimal glands: Secondary | ICD-10-CM | POA: Diagnosis not present

## 2018-08-16 DIAGNOSIS — H35371 Puckering of macula, right eye: Secondary | ICD-10-CM | POA: Diagnosis not present

## 2018-08-20 DIAGNOSIS — H04123 Dry eye syndrome of bilateral lacrimal glands: Secondary | ICD-10-CM | POA: Diagnosis not present

## 2018-08-31 DIAGNOSIS — J029 Acute pharyngitis, unspecified: Secondary | ICD-10-CM | POA: Diagnosis not present

## 2018-08-31 DIAGNOSIS — B9689 Other specified bacterial agents as the cause of diseases classified elsewhere: Secondary | ICD-10-CM | POA: Diagnosis not present

## 2018-08-31 DIAGNOSIS — J019 Acute sinusitis, unspecified: Secondary | ICD-10-CM | POA: Diagnosis not present

## 2018-09-09 ENCOUNTER — Telehealth: Payer: Self-pay | Admitting: Internal Medicine

## 2018-09-09 NOTE — Addendum Note (Signed)
Addended by: Biagio Borg on: 09/09/2018 07:38 PM   Modules accepted: Level of Service

## 2018-09-11 DIAGNOSIS — J329 Chronic sinusitis, unspecified: Secondary | ICD-10-CM | POA: Diagnosis not present

## 2018-09-11 DIAGNOSIS — J45909 Unspecified asthma, uncomplicated: Secondary | ICD-10-CM

## 2018-09-11 DIAGNOSIS — J209 Acute bronchitis, unspecified: Secondary | ICD-10-CM | POA: Diagnosis not present

## 2018-10-03 DIAGNOSIS — J329 Chronic sinusitis, unspecified: Secondary | ICD-10-CM | POA: Diagnosis not present

## 2018-10-06 DIAGNOSIS — N3001 Acute cystitis with hematuria: Secondary | ICD-10-CM | POA: Diagnosis not present

## 2018-10-06 DIAGNOSIS — R319 Hematuria, unspecified: Secondary | ICD-10-CM | POA: Diagnosis not present

## 2018-10-07 ENCOUNTER — Ambulatory Visit: Payer: BLUE CROSS/BLUE SHIELD | Admitting: Internal Medicine

## 2018-10-09 ENCOUNTER — Telehealth: Payer: Self-pay | Admitting: Obstetrics & Gynecology

## 2018-10-09 NOTE — Telephone Encounter (Signed)
Spoke with patient. Patient states she is taking Macrobid 100 mg bid for UTI symptoms, seen at [facility] on 10/20. Was advised today UC negative, was not advised to either to stop or continue abx. Patient reports urethra is bloody and irritated, urinary symptoms have improved, but not resolved. Recently completed Augmentin for Sinus infection.   Denies vaginal d/c, itching, odor, N/V, fever/chills, lower back pain.   Recommended OV with Dr. Sabra Heck, scheduled for 10/24 at 2pm. Advised to continue abx until OV, Dr. Sabra Heck can advise at that time.    Routing to provider for final review. Patient is agreeable to disposition. Will close encounter.

## 2018-10-09 NOTE — Telephone Encounter (Signed)
Left message to call Sharee Pimple, RN at [facility].    Per review of Care Everywhere Patient seen at [facility] On 10/06/18. Started on Macrobid 100mg  bid x7 days

## 2018-10-09 NOTE — Telephone Encounter (Signed)
Patient is taking antibiotics for a uti and want to come in to have urine tested to make sure there's no more blood in her urine.

## 2018-10-10 ENCOUNTER — Ambulatory Visit (INDEPENDENT_AMBULATORY_CARE_PROVIDER_SITE_OTHER): Payer: BLUE CROSS/BLUE SHIELD | Admitting: Obstetrics & Gynecology

## 2018-10-10 ENCOUNTER — Other Ambulatory Visit: Payer: Self-pay

## 2018-10-10 ENCOUNTER — Encounter: Payer: Self-pay | Admitting: Obstetrics & Gynecology

## 2018-10-10 VITALS — BP 138/80 | HR 80 | Temp 98.0°F | Resp 16 | Ht 65.75 in | Wt 145.4 lb

## 2018-10-10 DIAGNOSIS — R300 Dysuria: Secondary | ICD-10-CM

## 2018-10-10 DIAGNOSIS — N898 Other specified noninflammatory disorders of vagina: Secondary | ICD-10-CM | POA: Diagnosis not present

## 2018-10-10 DIAGNOSIS — R310 Gross hematuria: Secondary | ICD-10-CM

## 2018-10-10 LAB — POCT URINALYSIS DIPSTICK
BILIRUBIN UA: NEGATIVE
GLUCOSE UA: NEGATIVE
KETONES UA: NEGATIVE
Leukocytes, UA: NEGATIVE
Nitrite, UA: NEGATIVE
Protein, UA: NEGATIVE
RBC UA: NEGATIVE
Urobilinogen, UA: 0.2 E.U./dL
pH, UA: 5 (ref 5.0–8.0)

## 2018-10-10 NOTE — Progress Notes (Signed)
GYNECOLOGY  VISIT  CC:   Urinary urgency  HPI: 62 y.o. [id number] Married White or Caucasian female here for complaint of dysuria and hematuria that she was seen at urgent care for over the weekend.  She is currently on macrobid.    Reports she has been treated for sinus issues since the middle of September.  She was ultimately on this for 20 days.  She fininshed this on October 16th.  She's been advised there is still fluid in her sinus and a CT is scheduled for follow-up.  Then on Saturday, she woke up with urinary pressure, dysuria and also noted blood in her urine at the end of the stream.  She increased her water in take and this helped her symptoms.  The symptoms improved but did not fully go away.  Then on Sunday, the urgency and dysuria were worse.  As well, there was increased blood in her urine.  She decided to go to Urgent Care.  She looked at herself with a mirror and her urethral looked very red and bloody.  She was treated with macrobid bid x days.  She's taken four full days. Reports the visible blood is gone.    GYNECOLOGIC HISTORY: Patient's last menstrual period was 06/17/2009 (approximate). Contraception: post menopausal  Menopausal hormone therapy: none  Patient Active Problem List   Diagnosis Date Noted  . 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 Eustachian tube dysfunction, right 04/17/2018  . Anterior neck pain 04/10/2018  . Multiple thyroid nodules 12/03/2017  . Anxiety 09/26/2017  . Liver hemangioma 12/05/2016  . Renal cyst, right 12/05/2016  . 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 Pes anserine bursitis 04/21/2014  . 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 Osteopenia 08/04/2011  . Unspecified vitamin D deficiency 08/04/2011  . History of mitral valve prolapse 07/26/2010  . 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 Raynaud's syndrome 01/05/2010  . Hyperlipidemia 08/17/2009  . POLYARTHRITIS 08/17/2009    Past Medical History:  Diagnosis Date   . Cardiospasm 1983   felt may be related to MVP  . Hyperlipidemia   . Kidney cysts 11/2015   cyst on one of her kidneys, no treatment required  . Mitral valve prolapse   . PONV (postoperative nausea and vomiting)    teeth chipped  . Raynaud disease   . Retinal hole of right eye   . Shingles 2012   leg  . Tachycardia     Past Surgical History:  Procedure Laterality Date  . COLONOSCOPY  2007   [place], [place] ,[name]  . DILATATION & CURETTAGE/HYSTEROSCOPY WITH MYOSURE N/A 08/29/2016   Procedure: DILATATION & CURETTAGE/HYSTEROSCOPY;  Surgeon: Salvadore Dom, MD;  Location: [facility];  Service: Gynecology;  Laterality: N/A;  . DILATION AND CURETTAGE OF UTERUS    . EXCISION OF SKIN TAG Left 08/29/2016   Procedure: EXCISION OF SKIN TAG left labia majora;  Surgeon: Salvadore Dom, MD;  Location: [facility];  Service: Gynecology;  Laterality: Left;  . EXPLORATORY LAPAROTOMY     X2 for pain: fibroid, enlarged oviduct  . RETINAL LASER PROCEDURE Right 08/2015  . SALPINGECTOMY  2007 or 2008   and fibroid removed  . TUBAL LIGATION      MEDS:   Current Outpatient Medications on File Prior to Visit  Medication Sig Dispense Refill  . ALPRAZolam (XANAX) 0.5 MG tablet TAKE ONE TABLET BY MOUTH EVERY 8 HOURS AS NEEDED FOR ANXIETY 15 tablet 0  . Cholecalciferol (VITAMIN D) 2000 units CAPS  Take 2,000 Units by mouth daily.    . metoprolol succinate (TOPROL-XL) 25 MG 24 hr tablet Take 1 tablet (25 mg total) by mouth daily. 90 tablet 3  . nitrofurantoin, macrocrystal-monohydrate, (MACROBID) 100 MG capsule Take 1 capsule by mouth 2 (two) times daily.    . rosuvastatin (CRESTOR) 5 MG tablet Take 5 mg by mouth daily.     No current facility-administered medications on file prior to visit.     ALLERGIES: Lipitor [atorvastatin]  Family History  Problem Relation Age of Onset  . Heart attack Father 20  . Myasthenia gravis Father   . Hypertension Father   . Hyperlipidemia Father   . Osteoporosis  Father        due to steroids for Myasthenia Grvis  . Leukemia Mother 25  . Hyperlipidemia Mother   . Hypertension Mother   . Hypothyroidism Mother   . Prostate cancer Brother        in 65s  . Hyperlipidemia Brother   . Stroke Maternal Grandfather        in 29s  . Diabetes Neg Hx     SH:  Married, non smoker  Review of Systems  HENT: Positive for congestion.   Genitourinary: Positive for dysuria and hematuria.  All other systems reviewed and are negative.   PHYSICAL EXAMINATION:    BP 138/80 (BP Location: Right Arm, Patient Position: Sitting, Cuff Size: Large)   Pulse 80   Temp 98 F (36.7 C) (Oral)   Resp 16   Ht 5' 5.75" (1.67 m)   Wt 145 lb 6.4 oz (66 kg)   LMP 06/17/2009 (Approximate)   BMI 23.65 kg/m     General appearance: alert, cooperative and appears stated age Abdomen: soft, non-tender; bowel sounds normal; no masses,  no organomegaly Lymph:  no inguinal LAD noted  Pelvic: External genitalia:  no lesions              Urethra:  normal appearing urethra with no masses, tenderness or lesions              Bartholins and Skenes: normal                 Vagina: normal appearing vagina with normal color and discharge, no lesions               Chaperone was present for exam.  Assessment: Possible cystitis earlier this week with gross hematuria that has resolved (negative hematuria no testing today)  Plan: Vaginitis testing completed.   Advised stopping macrobid after 5 days. Should call with any new changes/concerns.

## 2018-10-10 NOTE — Patient Instructions (Signed)
Probiotic

## 2018-10-11 ENCOUNTER — Telehealth: Payer: Self-pay | Admitting: Obstetrics & Gynecology

## 2018-10-11 LAB — VAGINITIS/VAGINOSIS, DNA PROBE
Candida Species: NEGATIVE
Gardnerella vaginalis: NEGATIVE
TRICHOMONAS VAG: NEGATIVE

## 2018-10-11 MED ORDER — PHENAZOPYRIDINE HCL 100 MG PO TABS: 100.0000 mg | ORAL_TABLET | Freq: Three times a day (TID) | ORAL | 0 refills | Status: DC

## 2018-10-11 MED ORDER — ESTRADIOL 0.1 MG/GM VA CREA: TOPICAL_CREAM | VAGINAL | 0 refills | Status: DC

## 2018-10-11 NOTE — Telephone Encounter (Signed)
Call placed to home number, number busy.   Call placed to mobile number, phone did not ring, automated message, no voicemail.

## 2018-10-11 NOTE — Telephone Encounter (Signed)
Spoke with patient. Advised as seen below per Dr. Sabra Heck. Rx for pyridium and estrace vaginal cream to verified pharmacy. Patient request to return call to office on Monday to provide update and schedule OV. Patient verbalizes understanding and is agreeable.

## 2018-10-11 NOTE — Telephone Encounter (Signed)
Spoke with patient. Patient states she was feeling better,  pressure in bladder, urgency and burning sensation started again today about mid-day. Only feels relief when sitting in warm water. Currently on Macrobid. Asking for recommendations for symptom relief.   Denies lower back pain, fever/chills, N/V.  Advised 10/10/18 vaginitis testing negative for yeast, BV and trich.   Will review with Dr. Sabra Heck and return call. Patient agreeable.   Dr. Sabra Heck -please advise.

## 2018-10-11 NOTE — Telephone Encounter (Signed)
Ok to try pyridium 100mg  tid.  It will make urine very orange.  If she is ok trying some topical estrogen cream, she use a pea sized amt to external skin daily.  Ok to send in rx for estrace or premarin.  I reviewed chart for contraindications.  Probably needs follow up again and repeat urine culture but needs to be off antibiotics for this.

## 2018-10-11 NOTE — Telephone Encounter (Signed)
Patient was seen yesterday 10/10/18 for urinary symptoms. States she is still having symptoms and would like to speak with a nurse.

## 2018-10-14 DIAGNOSIS — J329 Chronic sinusitis, unspecified: Secondary | ICD-10-CM | POA: Diagnosis not present

## 2018-10-14 DIAGNOSIS — J343 Hypertrophy of nasal turbinates: Secondary | ICD-10-CM | POA: Diagnosis not present

## 2018-10-14 DIAGNOSIS — J342 Deviated nasal septum: Secondary | ICD-10-CM | POA: Diagnosis not present

## 2018-10-14 DIAGNOSIS — J3489 Other specified disorders of nose and nasal sinuses: Secondary | ICD-10-CM | POA: Diagnosis not present

## 2018-10-16 NOTE — Telephone Encounter (Signed)
Call to patient, phone rang and rang, no answer, voicemail did not pick up.

## 2018-10-17 NOTE — Telephone Encounter (Signed)
Patient returning Jill's call.  °

## 2018-10-17 NOTE — Telephone Encounter (Signed)
Spoke with patient. Completed abx on Sunday and pyridum 1.5 days ago. Patient states she feels like she has a "heighten sensitivity and pressure in bladder". No back to her "normal" yet, but no pain with urination. Sits to paint a lot, feels pressure more when leaning forward, feels better when walking. Denies frequency, urgency, lower back pain or blood in urine. Has started vaginal estrogen cream.    Patient declines to schedule nurse visit for repeat urine culture at this time. Patient states she would like to think about things more, will return call tomorrow to schedule. Instructed patient to return call to office if symptoms do not resolve or new symptoms develop.   Routing to provider for final review. Patient is agreeable to disposition. Will close encounter.

## 2018-10-18 ENCOUNTER — Ambulatory Visit (INDEPENDENT_AMBULATORY_CARE_PROVIDER_SITE_OTHER): Payer: BLUE CROSS/BLUE SHIELD

## 2018-10-18 VITALS — BP 140/64 | HR 66 | Resp 14 | Ht 65.75 in | Wt 147.0 lb

## 2018-10-18 DIAGNOSIS — R300 Dysuria: Secondary | ICD-10-CM | POA: Diagnosis not present

## 2018-10-18 NOTE — Progress Notes (Signed)
Patient is here for a urine culture states she is still having pressure, denies all other symptoms.

## 2018-10-19 LAB — URINE CULTURE: Organism ID, Bacteria: NO GROWTH

## 2018-10-21 ENCOUNTER — Telehealth: Payer: Self-pay | Admitting: *Deleted

## 2018-10-21 NOTE — Telephone Encounter (Signed)
LM for pt to call back.

## 2018-10-21 NOTE — Telephone Encounter (Signed)
-----   Message from Megan Salon, MD sent at 10/20/2018 11:06 PM EST ----- Please let pt know her her urine culture is negative.  May need to consider other medications if still have any urinary symptoms.  She does not need antibiotics again, however.  Please get update on symptoms.  Thanks.

## 2018-10-21 NOTE — Telephone Encounter (Signed)
Patient notified

## 2018-12-03 ENCOUNTER — Ambulatory Visit: Payer: BLUE CROSS/BLUE SHIELD | Admitting: Internal Medicine

## 2018-12-05 DIAGNOSIS — H2513 Age-related nuclear cataract, bilateral: Secondary | ICD-10-CM | POA: Diagnosis not present

## 2018-12-05 DIAGNOSIS — H40013 Open angle with borderline findings, low risk, bilateral: Secondary | ICD-10-CM | POA: Diagnosis not present

## 2018-12-05 DIAGNOSIS — H04123 Dry eye syndrome of bilateral lacrimal glands: Secondary | ICD-10-CM | POA: Diagnosis not present

## 2018-12-05 DIAGNOSIS — H35371 Puckering of macula, right eye: Secondary | ICD-10-CM | POA: Diagnosis not present

## 2018-12-19 ENCOUNTER — Telehealth: Payer: Self-pay | Admitting: Internal Medicine

## 2018-12-19 DIAGNOSIS — I2510 Atherosclerotic heart disease of native coronary artery without angina pectoris: Secondary | ICD-10-CM

## 2018-12-19 DIAGNOSIS — E785 Hyperlipidemia, unspecified: Secondary | ICD-10-CM

## 2018-12-19 NOTE — Telephone Encounter (Signed)
New Message:     Pt has an appt with Dr Harrington Challenger on 04-14-19. She wants to know if she needs lab work before her appt? If so, please let her know.

## 2018-12-19 NOTE — Telephone Encounter (Signed)
Will route to Dr. Harrington Challenger to see what she may want drawn.  Pt is overdue for lipid and liver check

## 2018-12-21 NOTE — Telephone Encounter (Signed)
Set patient up for CBC, BMET and Lipid panel

## 2018-12-24 NOTE — Telephone Encounter (Signed)
Lab orders placed to be drawn prior to next visit in April.  Message to scheduling to arrange appointment with patient.

## 2019-01-14 NOTE — Telephone Encounter (Signed)
Amendment request approved

## 2019-01-17 ENCOUNTER — Telehealth: Payer: Self-pay | Admitting: Internal Medicine

## 2019-01-17 NOTE — Telephone Encounter (Signed)
Pt walked in asking for Dr. Dionicio Stall but advised that they were out of the office... she asked if someone could just bring her back for an EKG and BP check but I advised her that if she was not feeling well she should go over to the ER or Urgent Care for assessment.. 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 I explained that the office was about to close and I would need to have an available physician here to evaluate her and her BP/EKG and she could get in to see someone at one of those locations. I also offered to make her an appt for early next week but her and her husband declined and left.

## 2019-01-17 NOTE — Telephone Encounter (Signed)
Left message for patient to call back  

## 2019-01-17 NOTE — Telephone Encounter (Signed)
New message     Pt wants to be sooner than 4/27.     Patient c/o Palpitations:  High priority if patient c/o lightheadedness, shortness of breath, or chest pain  1) How long have you had palpitations/irregular HR/ Afib? Are you having the symptoms now?  within a week or 2 / no  2) Are you currently experiencing lightheadedness, SOB or CP? no  3) Do you have a history of afib (atrial fibrillation) or irregular heart rhythm? Yes   4) Have you checked your BP or HR? (document readings if available): pulse 88   5) Are you experiencing any other symptoms? No

## 2019-01-20 ENCOUNTER — Ambulatory Visit (INDEPENDENT_AMBULATORY_CARE_PROVIDER_SITE_OTHER): Payer: BLUE CROSS/BLUE SHIELD | Admitting: *Deleted

## 2019-01-20 VITALS — BP 158/104 | HR 82 | Ht 65.5 in | Wt 143.5 lb

## 2019-01-20 DIAGNOSIS — R002 Palpitations: Secondary | ICD-10-CM

## 2019-01-20 DIAGNOSIS — I10 Essential (primary) hypertension: Secondary | ICD-10-CM

## 2019-01-20 MED ORDER — METOPROLOL SUCCINATE ER 25 MG PO TB24: 25.0000 mg | ORAL_TABLET | Freq: Two times a day (BID) | ORAL | 1 refills | Status: DC

## 2019-01-20 NOTE — Patient Instructions (Signed)
Medication Instructions:  Please increase Metoprolol to 25 mg twice daily. Continue all other medications as listed.  If you need a refill on your cardiac medications before your next appointment, please call your pharmacy.   Check BP and keep a diary to bring to your next appointment.  You should take BP at least 1 hour after taking your medication, once daily at a random time.   Please follow a low Sodium diet.  Follow-Up: Follow up in 2 to 3 weeks.  Thank you for choosing [facility]!!

## 2019-01-20 NOTE — Telephone Encounter (Signed)
° ° °  Patient calling to request appt for today with Dr Harrington Challenger.   Patient states her  BP was 160/100 on Friday. She says she did not take her BP any more because she was afraid of what it may read.   Requesting call from nurse.

## 2019-01-20 NOTE — Telephone Encounter (Signed)
Reviewed with Dr. Harrington Challenger and with triage nurse. Added patient to nurse room schedule today at 2pm. Left this detailed information patient's VM.  Reached pt on mobile number. She is on way to our office again.  Asking for someone to check BP and EKG. Adv that she has been scheduled for 2pm and if needs seen sooner and if the nurse is unavailable to see her sooner she would need to proceed to the ER for urgent evaluation.  Her symptoms are that since last Thurs her BP has been elevated.  Heartbeat is going fast.  No chest pain.  SOB climbing stairs.   Taken xanax over weekend which did not help.  Will review with triage nurse.

## 2019-01-20 NOTE — Progress Notes (Signed)
Pt here today after demanding to be seen for palpitations and hypertension.  Per pt report - she felt "bad" Friday and had a h/a which is not normal for her.  She checked her BP and it was 160/100.  She did not check her BP over the weekend because she was "afraid of what it would be" despite taking her normal dose of Metoprolol 25 mg QHS and an extra 1/2 tablet in the AM on Saturday and Sunday. She reports feeling as though her heart has been skipping more than normal though she reports having this issue since she was pregnant last (now 63 yrs old).  She reports feeling like she has a knot in her stomach, more SOB with walking, some dizziness and having the taste of blood in her mouth.  Of note - she has been taking Doxycycline 100 mg BID as RXed by her eye doctor for "dry eyes."  The RX is for a total of 30 days and she has taken it now for 18 days.   Reviewed the above information and EKG with Dr Harrington Challenger who gives verbal orders to increase Metoprolol to 25 mg BID, monitor BP and f/u in 2 to 3 weeks.  Pt is aware.  Reassurance given.  Instructed her to take medication about 12 hours apart, check BP at least 1 to 2 hours after taking medication and to maintain a low NA+ diet.  Pt states understanding.  Rx to be sent into pharmacy requested.  She is to f/u in 2 to 3 weeks.

## 2019-01-20 NOTE — Telephone Encounter (Signed)
Follow up    Patient is calling to see if she can get an appointment today due to he bp being elevated per the previous message. Please advise.

## 2019-01-21 ENCOUNTER — Other Ambulatory Visit: Payer: Self-pay | Admitting: Internal Medicine

## 2019-01-21 NOTE — Telephone Encounter (Signed)
Last routine OV 02/22/18 Last refill 07/30/18 Next OV 02/24/19

## 2019-01-22 ENCOUNTER — Encounter: Payer: Self-pay | Admitting: Internal Medicine

## 2019-01-22 ENCOUNTER — Ambulatory Visit (INDEPENDENT_AMBULATORY_CARE_PROVIDER_SITE_OTHER): Payer: BLUE CROSS/BLUE SHIELD | Admitting: Internal Medicine

## 2019-01-22 ENCOUNTER — Other Ambulatory Visit (INDEPENDENT_AMBULATORY_CARE_PROVIDER_SITE_OTHER): Payer: BLUE CROSS/BLUE SHIELD

## 2019-01-22 VITALS — BP 162/70 | HR 88 | Temp 98.5°F | Resp 16 | Ht 65.5 in | Wt 145.8 lb

## 2019-01-22 DIAGNOSIS — I471 Supraventricular tachycardia: Secondary | ICD-10-CM

## 2019-01-22 DIAGNOSIS — R030 Elevated blood-pressure reading, without diagnosis of hypertension: Secondary | ICD-10-CM | POA: Diagnosis not present

## 2019-01-22 DIAGNOSIS — I10 Essential (primary) hypertension: Secondary | ICD-10-CM | POA: Insufficient documentation

## 2019-01-22 DIAGNOSIS — E785 Hyperlipidemia, unspecified: Secondary | ICD-10-CM

## 2019-01-22 DIAGNOSIS — F419 Anxiety disorder, unspecified: Secondary | ICD-10-CM | POA: Diagnosis not present

## 2019-01-22 LAB — CBC WITH DIFFERENTIAL/PLATELET
BASOS ABS: 0 10*3/uL (ref 0.0–0.1)
Basophils Relative: 0.6 % (ref 0.0–3.0)
Eosinophils Absolute: 0.1 10*3/uL (ref 0.0–0.7)
Eosinophils Relative: 1.5 % (ref 0.0–5.0)
HEMATOCRIT: 42.4 % (ref 36.0–46.0)
Hemoglobin: 14.4 g/dL (ref 12.0–15.0)
LYMPHS PCT: 29.7 % (ref 12.0–46.0)
Lymphs Abs: 1.5 10*3/uL (ref 0.7–4.0)
MCHC: 33.9 g/dL (ref 30.0–36.0)
MCV: 89.3 fl (ref 78.0–100.0)
Monocytes Absolute: 0.3 10*3/uL (ref 0.1–1.0)
Monocytes Relative: 5.4 % (ref 3.0–12.0)
Neutro Abs: 3.1 10*3/uL (ref 1.4–7.7)
Neutrophils Relative %: 62.8 % (ref 43.0–77.0)
Platelets: 170 10*3/uL (ref 150.0–400.0)
RBC: 4.75 Mil/uL (ref 3.87–5.11)
RDW: 12.8 % (ref 11.5–15.5)
WBC: 5 10*3/uL (ref 4.0–10.5)

## 2019-01-22 LAB — LIPID PANEL
Cholesterol: 193 mg/dL (ref 0–200)
HDL: 66 mg/dL (ref >39.00)
LDL Cholesterol: 106 mg/dL — ABNORMAL HIGH (ref 0–99)
NonHDL: 126.79
TRIGLYCERIDES: 106 mg/dL (ref 0.0–149.0)
Total CHOL/HDL Ratio: 3
VLDL: 21.2 mg/dL (ref 0.0–40.0)

## 2019-01-22 LAB — COMPREHENSIVE METABOLIC PANEL
ALT: 28 U/L (ref 0–35)
AST: 20 U/L (ref 0–37)
Albumin: 4.7 g/dL (ref 3.5–5.2)
Alkaline Phosphatase: 81 U/L (ref 39–117)
BUN: 19 mg/dL (ref 6–23)
CALCIUM: 9.8 mg/dL (ref 8.4–10.5)
CO2: 31 mEq/L (ref 19–32)
Chloride: 103 mEq/L (ref 96–112)
Creatinine, Ser: 0.75 mg/dL (ref 0.40–1.20)
GFR: 77.98 mL/min (ref >60.00)
Glucose, Bld: 96 mg/dL (ref 70–99)
Potassium: 4 mEq/L (ref 3.5–5.1)
Sodium: 141 mEq/L (ref 135–145)
TOTAL PROTEIN: 7.3 g/dL (ref 6.0–8.3)
Total Bilirubin: 0.4 mg/dL (ref 0.2–1.2)

## 2019-01-22 LAB — TSH: TSH: 2.66 u[IU]/mL (ref 0.35–4.50)

## 2019-01-22 MED ORDER — ESCITALOPRAM OXALATE 10 MG PO TABS: 10.0000 mg | ORAL_TABLET | Freq: Every day | ORAL | 5 refills | Status: DC

## 2019-01-22 NOTE — Assessment & Plan Note (Signed)
Check lipid panel  Continue daily statin Regular exercise and healthy diet encouraged  

## 2019-01-22 NOTE — Assessment & Plan Note (Signed)
Blood pressure elevated She has had occasional elevated blood pressure and she may have developed hypertension, but anxiety is also playing a role Will be started on Lexapro 10 mg daily Metoprolol recently increased so we will hold off on making any other changes at this time Advised her to monitor her blood pressure and call myself or cardiology if elevated Has cardiology follow-up Can see me next week if needed, if not we will follow-up at her CPE

## 2019-01-22 NOTE — Patient Instructions (Signed)
  Have blood work done today.     Start lexapro 10 mg daily.   This was sent to your pharmacy.    Keep walking regularly.   Monitor your BP.     Call us or cardiology if you have any concerns.

## 2019-01-22 NOTE — Progress Notes (Signed)
Subjective:    Patient ID: Tiffany Moran, female    DOB: 02-Aug-1956, 63 y.o.   MRN: [id number]  HPI The patient is here for an acute visit.  She has paroxysmal atrial tachycardia.  She has had extra beats and typically have them one a day or a few a day, but they have increased.  She has had more in the past three weeks and that has caused increased anxiety.  She has been taking xanax more and when she takes that that helps, but it often comes back.  She sometimes feels a quivering inside of her.  She does not typically take the alprazolam on a regular basis-only takes it for flying.  She did not feel well a few days ago-she had a headache and she took her blood pressure and it was elevated.  She saw cardiology two days ago. She had an EKG done which was normal.  Her metoprolol was increased.  She has not taken her blood pressure since then because she is concerned that will still be high.   She denies regular exercise.   She walks irregularly - usually walks an hour.      Her dad died two years ago and her daughter is stressed out and everything is falling on her.    She denies any changes in medication, supplements, diet, caffeine intake that could cause the increase palpitations/premature beats.  She knows that they are causing her to be more anxious, but she is not sure if anxiety is causing them or not.  Hyperlipidemia: She is taking her medication daily. She is compliant with a low fat/cholesterol diet. She is exercising fairly regularly until recently. She denies myalgias.     Medications and allergies reviewed with patient and updated if appropriate.  Patient Active Problem List   Diagnosis Date Noted  . Bronchitis with asthma, subacute 09/11/2018  . Chronic sinusitis 09/11/2018  . 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 Eustachian tube dysfunction, right 04/17/2018  . Anterior neck pain 04/10/2018  . Multiple thyroid nodules 12/03/2017  . Anxiety 09/26/2017  .  Liver hemangioma 12/05/2016  . Renal cyst, right 12/05/2016  . 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 Pes anserine bursitis 04/21/2014  . 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 Osteopenia 08/04/2011  . Unspecified vitamin D deficiency 08/04/2011  . History of mitral valve prolapse 07/26/2010  . 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 Raynaud's syndrome 01/05/2010  . Hyperlipidemia 08/17/2009  . POLYARTHRITIS 08/17/2009    Current Outpatient Medications on File Prior to Visit  Medication Sig Dispense Refill  . ALPRAZolam (XANAX) 0.5 MG tablet TAKE ONE TABLET BY MOUTH EVERY 8 HOURS AS NEEDED FOR ANXIETY 15 tablet 0  . Cholecalciferol (VITAMIN D) 2000 units CAPS Take 2,000 Units by mouth daily.    . metoprolol succinate (TOPROL-XL) 25 MG 24 hr tablet Take 1 tablet (25 mg total) by mouth 2 (two) times daily. 180 tablet 1  . rosuvastatin (CRESTOR) 5 MG tablet Take 5 mg by mouth daily.     No current facility-administered medications on file prior to visit.     Past Medical History:  Diagnosis Date  . Cardiospasm 1983   felt may be related to MVP  . Hyperlipidemia   . Kidney cysts 11/2015   cyst on one of her kidneys, no treatment required  . Mitral valve prolapse   . PONV (postoperative nausea and vomiting)    teeth chipped  . Raynaud disease   .  Retinal hole of right eye   . Shingles 2012   leg  . Tachycardia     Past Surgical History:  Procedure Laterality Date  . COLONOSCOPY  2007   [place], [place] ,[name]  . DILATATION & CURETTAGE/HYSTEROSCOPY WITH MYOSURE N/A 08/29/2016   Procedure: DILATATION & CURETTAGE/HYSTEROSCOPY;  Surgeon: Salvadore Dom, MD;  Location: [facility];  Service: Gynecology;  Laterality: N/A;  . DILATION AND CURETTAGE OF UTERUS    . EXCISION OF SKIN TAG Left 08/29/2016   Procedure: EXCISION OF SKIN TAG left labia majora;  Surgeon: Salvadore Dom, MD;  Location: [facility];  Service: Gynecology;  Laterality: Left;  . EXPLORATORY  LAPAROTOMY     X2 for pain: fibroid, enlarged oviduct  . RETINAL LASER PROCEDURE Right 08/2015  . SALPINGECTOMY  2007 or 2008   and fibroid removed  . TUBAL LIGATION      Social History   Socioeconomic History  . Marital status: Married    Spouse name: Not on file  . Number of children: Not on file  . Years of education: Not on file  . Highest education level: Not on file  Occupational History  . Not on file  Social Needs  . Financial resource strain: Not on file  . Food insecurity:    Worry: Not on file    Inability: Not on file  . Transportation needs:    Medical: Not on file    Non-medical: Not on file  Tobacco Use  . Smoking status: Never Smoker  . Smokeless tobacco: Never Used  Substance and Sexual Activity  . Alcohol use: No  . Drug use: No  . Sexual activity: Yes    Birth control/protection: Other-see comments, Post-menopausal    Comment: BTL  Lifestyle  . Physical activity:    Days per week: Not on file    Minutes per session: Not on file  . Stress: Not on file  Relationships  . Social connections:    Talks on phone: Not on file    Gets together: Not on file    Attends religious service: Not on file    Active member of club or organization: Not on file    Attends meetings of clubs or organizations: Not on file    Relationship status: Not on file  Other Topics Concern  . Not on file  Social History Narrative   Exercises regularly    Family History  Problem Relation Age of Onset  . Heart attack Father 21  . Myasthenia gravis Father   . Hypertension Father   . Hyperlipidemia Father   . Osteoporosis Father        due to steroids for Myasthenia Grvis  . Leukemia Mother 49  . Hyperlipidemia Mother   . Hypertension Mother   . Hypothyroidism Mother   . Prostate cancer Brother        in 16s  . Hyperlipidemia Brother   . Stroke Maternal Grandfather        in 80s  . Diabetes Neg Hx     Review of Systems  Constitutional: Negative for chills and  fever.       Feels cold at times  Respiratory: Positive for shortness of breath (more so lately going up stairs - past 1 week). Negative for cough and wheezing.   Cardiovascular: Positive for palpitations. Negative for chest pain (occ twinges in heart area) and leg swelling.  Gastrointestinal:       Knot like feeling in epigastric region past couple  of days, beating sensation  Neurological: Positive for light-headedness (with PACs, getting up quickly) and headaches (dull headache 5 days ago only).  Psychiatric/Behavioral: The patient is nervous/anxious.        Objective:   Vitals:   01/22/19 1440  BP: (!) 162/70  Pulse: 88  Resp: 16  Temp: 98.5 F (36.9 C)  SpO2: 99%   BP Readings from Last 3 Encounters:  01/22/19 (!) 162/70  01/20/19 (!) 158/104  10/18/18 140/64   Wt Readings from Last 3 Encounters:  01/22/19 145 lb 12.8 oz (66.1 kg)  01/20/19 143 lb 8 oz (65.1 kg)  10/18/18 147 lb (66.7 kg)   Body mass index is 23.89 kg/m.   Physical Exam    Constitutional: Appears well-developed and well-nourished. No distress.  HENT:  Head: Normocephalic and atraumatic.  Neck: Neck supple. No tracheal deviation present. No thyromegaly present.  No cervical lymphadenopathy Cardiovascular: Normal rate, regular rhythm and normal heart sounds.   No murmur heard. No carotid bruit .  No edema Pulmonary/Chest: Effort normal and breath sounds normal. No respiratory distress. No has no wheezes. No rales.  Skin: Skin is warm and dry. Not diaphoretic.  Psychiatric: Anxious mood and affect. Behavior is normal.       Assessment & Plan:    See Problem List for Assessment and Plan of chronic medical problems.

## 2019-01-22 NOTE — Assessment & Plan Note (Signed)
Typically she has intermittent anxiety and takes alprazolam for travel only, but has had increased anxiety recently with increased palpitations There has been some overall increase generalized stress and she may have some underlying anxiety as well Okay to take alprazolam on occasion, but I do not want her taking it regularly We will start her on Lexapro 10 mg daily to see if that helps with generalized anxiety and increased anxiety due to palpitations

## 2019-01-22 NOTE — Assessment & Plan Note (Signed)
Follows with Dr. West Pugh metoprolol daily and has always had some palpitations, but increased recently Metoprolol increased by cardiology 2 days ago EKG within normal limits Palpitations causing anxiety,?  Anxiety causing increased palpitations Continue current dose of metoprolol Has follow-up with cardiology Check CBC, CMP, TSH

## 2019-01-23 ENCOUNTER — Encounter: Payer: Self-pay | Admitting: Internal Medicine

## 2019-01-29 ENCOUNTER — Ambulatory Visit: Payer: Self-pay | Admitting: *Deleted

## 2019-01-29 ENCOUNTER — Telehealth: Payer: Self-pay | Admitting: Internal Medicine

## 2019-01-29 ENCOUNTER — Encounter: Payer: Self-pay | Admitting: Internal Medicine

## 2019-01-29 NOTE — Telephone Encounter (Signed)
Message from Ivar Drape sent at 01/29/2019 8:29 AM EST   She just said she wanted to talk to a nurse about her condition that she saw the provider for last week.   Pt calling with her b/p reading of 160/100 before taking medication around 7:30 this morning. She took her meds about an hour later. She tried taking her b/p again but the cuff is malfunctioning. She is going out to get another one and will call back with the readings.  She wanted her provider to know that she has not started taking the lexapro and not had xanax because she did not want to mask anything. But she is not sleeping well and she seems to be having more reflux since increasing the b/p medication. She is walking, exercising and try to do everthing that she was advised to do. Pt calling back with readings.   Reason for Disposition . Health Information question, no triage required and triager able to answer question  Answer Assessment - Initial Assessment Questions 1. REASON FOR CALL or QUESTION: "What is your reason for calling today?" or "How can I best help you?" or "What question do you have that I can help answer?"     Calling to give information on her b/p.  Protocols used: INFORMATION ONLY CALL-A-AH

## 2019-01-29 NOTE — Telephone Encounter (Signed)
Continue to monitor BP and update [place] in a few days.  Yes she can take THC free tincture.  If she is still feeling increased generalized anxiety she should consider trying the Lexapro.  Epigastric discomfort is typically not a side effect of the blood pressure medication

## 2019-01-29 NOTE — Telephone Encounter (Signed)
Please advise 

## 2019-01-29 NOTE — Telephone Encounter (Signed)
Pt called back with her b/p of 136/88 from  8am this morning. She did take a xanax that is prescribed for her. She wanted to know if she could take THC free Tincture.  So she is asking for a call back.  She can be reached at [phone].  She is still experiencing epigastric discomfort and wants to know if this is a side effect of the b/p medication.

## 2019-01-29 NOTE — Telephone Encounter (Signed)
Pt called to report that she has had noticed more burping than usual and feels she has had a lot of "gas"... she also says she has a "twinge" type feeling under her right arm. She says she is anxious about her BP which is 160/100 this morning and she says she is going to check it again after she gets off of the phone with me.. she denies any other symptoms such as chest pain, sob, dizziness, headache, but says she always has palpations. I asked if she would consider calling Dr. Quay Burow her PMD since she has some GI issues and they can recheck her BP while she is there.. she says she will call them and if any further problems will give [place] a call us back.

## 2019-01-29 NOTE — Telephone Encounter (Signed)
New Message   Pt c/o medication issue:  1. Name of Medication: Metoprolol  2. How are you currently taking this medication (dosage and times per day)? 25mg  2x day  3. Are you having a reaction (difficulty breathing--STAT)? No  4. What is your medication issue? PT states she doesn't feel right, lots of gas, burping, PT says she has a "twinge" in the right of her under arm  PT says a week ago they increased her medication and she still is not feeling good. Her BP this morning was 160/100 Pt is wanting to see the doctor or a PA

## 2019-01-30 NOTE — Telephone Encounter (Signed)
Patient returned call, she was advised of the below note by Dr. Quay Burow on 01/29/19, patient verbalized understanding. She says she's burping a lot more, so she will try taking Pepto Bismol to see if that helps. She asked about spacing out her medications with the antiacid, how far apart should she take them. I advised to speak to her pharmacist if she has any questions about medication interactions. She verbalized understanding and says she will try a bland diet to see if that will relieve the belching/reflux issues. I advised to call on Monday with her BP readings.

## 2019-01-30 NOTE — Telephone Encounter (Signed)
She is scheduled back with Dr. Harrington Challenger on 02/17/19.

## 2019-01-30 NOTE — Telephone Encounter (Signed)
LVM for pt to call back in regards.  

## 2019-01-30 NOTE — Telephone Encounter (Signed)
Patient called, left VM on home phone, called cell phone and she says she will call back when she gets home.

## 2019-02-17 ENCOUNTER — Encounter: Payer: Self-pay | Admitting: Internal Medicine

## 2019-02-17 ENCOUNTER — Ambulatory Visit (INDEPENDENT_AMBULATORY_CARE_PROVIDER_SITE_OTHER): Payer: BLUE CROSS/BLUE SHIELD | Admitting: Internal Medicine

## 2019-02-17 VITALS — BP 172/81 | HR 71 | Ht 65.5 in | Wt 145.8 lb

## 2019-02-17 DIAGNOSIS — I10 Essential (primary) hypertension: Secondary | ICD-10-CM | POA: Diagnosis not present

## 2019-02-17 DIAGNOSIS — R0602 Shortness of breath: Secondary | ICD-10-CM | POA: Diagnosis not present

## 2019-02-17 DIAGNOSIS — R002 Palpitations: Secondary | ICD-10-CM

## 2019-02-17 DIAGNOSIS — R239 Unspecified skin changes: Secondary | ICD-10-CM | POA: Diagnosis not present

## 2019-02-17 MED ORDER — DILTIAZEM HCL 60 MG PO TABS: 60.0000 mg | ORAL_TABLET | Freq: Three times a day (TID) | ORAL | 11 refills | Status: DC

## 2019-02-17 MED ORDER — ROSUVASTATIN CALCIUM 10 MG PO TABS: 10.0000 mg | ORAL_TABLET | Freq: Every day | ORAL | 3 refills | Status: DC

## 2019-02-17 NOTE — Patient Instructions (Addendum)
Medication Instructions:  Your physician has recommended you make the following change in your medication:  1.) START diltiazem 60 mg three times a day (start with 1/2 tablet - 30 mg - three times a day, then increase) If you need a refill on your cardiac medications before your next appointment, please call your pharmacy.  2.) increase rosuvastatin (Crestor) to 10 mg once day  Lab work: Today: ANA, rheumatoid factor, sed rate If you have labs (blood work) drawn today and your tests are completely normal, you will receive your results only by: Marland Kitchen MyChart Message (if you have MyChart) OR . A paper copy in the mail If you have any lab test that is abnormal or we need to change your treatment, we will call you to review the results.  Testing/Procedures: Your physician has requested that you have an echocardiogram. Echocardiography is a painless test that uses sound waves to create images of your heart. It provides your doctor with information about the size and shape of your heart and how well your heart's chambers and valves are working. This procedure takes approximately one hour. There are no restrictions for this procedure.  Your physician has recommended that you wear a holter monitor. Holter monitors are medical devices that record the heart's electrical activity. Doctors most often use these monitors to diagnose arrhythmias. Arrhythmias are problems with the speed or rhythm of the heartbeat. The monitor is a small, portable device. You can wear one while you do your normal daily activities. This is usually used to diagnose what is causing palpitations/syncope (passing out).     Follow-Up: In about 6 weeks with Dr. Harrington Challenger  Any Other Special Instructions Will Be Listed Below (If Applicable).

## 2019-02-17 NOTE — Progress Notes (Signed)
Cardiology Office Note   Date:  02/17/2019   ID:  Tiffany Moran, DOB June 27, 1956, MRN [id number]  PCP:  Binnie Rail, MD  Cardiologist:   Dorris Carnes, MD   F/U of palpitations and mild CAD (by CT scan in 2016)    History of Present Illness: Tiffany Moran is a 63 y.o. female with a history of palpitations  Also a history of abnormal myovue Cardiac CT showed mild nonobstructive CAD (2016) I saw her in clinic in May 2019    [facility] called earlier this month comlaining of palpitations and feeling bad.   BP at home 160/100    She started on metoprolol 25 bid  Still having some palpitations  Worried When has skips has sinking feeling    ANxious   Warm.  Has not passed out   Feels SOB with activity    Has rash on hands   No problems with joints   No fevers   Outpatient Medications Prior to Visit  Medication Sig Dispense Refill  . ALPRAZolam (XANAX) 0.5 MG tablet TAKE ONE TABLET BY MOUTH EVERY 8 HOURS AS NEEDED FOR ANXIETY 15 tablet 0  . Cholecalciferol (VITAMIN D) 2000 units CAPS Take 2,000 Units by mouth daily.    . metoprolol succinate (TOPROL-XL) 25 MG 24 hr tablet Take 1 tablet (25 mg total) by mouth 2 (two) times daily. 180 tablet 1  . NON FORMULARY Hydro-eye 2 capsules daily    . rosuvastatin (CRESTOR) 5 MG tablet Take 5 mg by mouth daily.    Marland Kitchen escitalopram (LEXAPRO) 10 MG tablet Take 1 tablet (10 mg total) by mouth daily. (Patient not taking: Reported on 02/17/2019) 30 tablet 5   No facility-administered medications prior to visit.      Allergies:   Lipitor [atorvastatin]   Past Medical History:  Diagnosis Date  . Cardiospasm 1983   felt may be related to MVP  . Hyperlipidemia   . Kidney cysts 11/2015   cyst on one of her kidneys, no treatment required  . Mitral valve prolapse   . PONV (postoperative nausea and vomiting)    teeth chipped  . Raynaud disease   . Retinal hole of right eye   . Shingles 2012   leg  . Tachycardia     Past Surgical History:  Procedure  Laterality Date  . COLONOSCOPY  2007   [place], [place] ,[name]  . DILATATION & CURETTAGE/HYSTEROSCOPY WITH MYOSURE N/A 08/29/2016   Procedure: DILATATION & CURETTAGE/HYSTEROSCOPY;  Surgeon: Salvadore Dom, MD;  Location: [facility];  Service: Gynecology;  Laterality: N/A;  . DILATION AND CURETTAGE OF UTERUS    . EXCISION OF SKIN TAG Left 08/29/2016   Procedure: EXCISION OF SKIN TAG left labia majora;  Surgeon: Salvadore Dom, MD;  Location: [facility];  Service: Gynecology;  Laterality: Left;  . EXPLORATORY LAPAROTOMY     X2 for pain: fibroid, enlarged oviduct  . RETINAL LASER PROCEDURE Right 08/2015  . SALPINGECTOMY  2007 or 2008   and fibroid removed  . TUBAL LIGATION       Social History:  The patient  reports that she has never smoked. She has never used smokeless tobacco. She reports that she does not drink alcohol or use drugs.   Family History:  The patient's family history includes Heart attack (age of onset: 66) in her father; Hyperlipidemia in her brother, father, and mother; Hypertension in her father and mother; Hypothyroidism in her mother; Leukemia (age of onset: 30) in her mother;  Myasthenia gravis in her father; Osteoporosis in her father; Prostate cancer in her brother; Stroke in her maternal grandfather.    ROS:  Please see the history of present illness. All other systems are reviewed and  Negative to the above problem except as noted.    PHYSICAL EXAM: VS:  BP (!) 172/81   Pulse 71   Ht 5' 5.5" (1.664 m)   Wt 145 lb 12.8 oz (66.1 kg)   LMP 06/17/2009 (Approximate)   SpO2 99%   BMI 23.89 kg/m   GEN: Well nourished, well developed, in no acute distress  HEENT: normal  Neck: JVP is norrmal carotid bruits, or masses Cardiac: RRR; no murmurs, rubs, or gallops,no edema  Respiratory:  clear to auscultation bilaterally, normal work of breathing GI: soft, nontender, nondistended, + BS  No hepatomegaly  MS: no deformity Moving all extremities   Skin: warm and dry,  Erythmatous lesions on fingers   (near knuckes) Neuro:  Strength and sensation are intact Psych: euthymic mood, full affect   EKG:  EKG is ordered today.  SR 71 bpm   Lipid Panel    Component Value Date/Time   CHOL 193 01/22/2019 1544   CHOL 168 09/25/2017 0806   TRIG 106.0 01/22/2019 1544   HDL 66.00 01/22/2019 1544   HDL 72 09/25/2017 0806   CHOLHDL 3 01/22/2019 1544   VLDL 21.2 01/22/2019 1544   LDLCALC 106 (H) 01/22/2019 1544   LDLCALC 79 09/25/2017 0806      Wt Readings from Last 3 Encounters:  02/17/19 145 lb 12.8 oz (66.1 kg)  01/22/19 145 lb 12.8 oz (66.1 kg)  01/20/19 143 lb 8 oz (65.1 kg)      ASSESSMENT AND PLAN:  1  Palpitations   Skips sound isolated   Will set up for monitor (2 wk)   Start low dose dilt 30 tid increase to 60 tid  Continue metoprolol     2  Dyspnea   Pt with minimal CAD on CT in 2016   Volume status is OK   Will set up for echo  3  HTN   Add diltiazem  4   Rash   Hands have small lesions that almost look lik old bruises   Will check ANA, ESR, RF      Reassured pt     She is very anxious   Does not want to take meds    F/U in 6 wks     Signed, Dorris Carnes, MD  02/17/2019 10:21 [address] [place], [place], [place]  [address] Phone: [phone]; Fax: [phone]

## 2019-02-18 ENCOUNTER — Ambulatory Visit (HOSPITAL_COMMUNITY): Payer: BLUE CROSS/BLUE SHIELD | Attending: Cardiovascular Disease

## 2019-02-18 DIAGNOSIS — R0602 Shortness of breath: Secondary | ICD-10-CM

## 2019-02-18 DIAGNOSIS — R002 Palpitations: Secondary | ICD-10-CM | POA: Diagnosis not present

## 2019-02-18 LAB — ANA: Anti Nuclear Antibody(ANA): NEGATIVE

## 2019-02-18 LAB — RHEUMATOID FACTOR: Rheumatoid fact SerPl-aCnc: <10 IU/mL (ref 0.0–13.9)

## 2019-02-18 LAB — SEDIMENTATION RATE: Sed Rate: 3 mm/hr (ref 0–40)

## 2019-02-18 NOTE — Telephone Encounter (Signed)
I spoke with pt regarding my chart message. She reports she started cardizem as advised at office visit yesterday. So far has taken 3 half tablets.  This AM she had a slight nosebleed which has now stopped. She woke up with a headache and is wondering if this could be related to cardizem.  She has not taken anything for headache. I told her she could take tylenol. Also complains of queasy stomach.  I advised her to continue cardizem and let us know if headache/stomach issues continue. I told her I would make Dr. Harrington Challenger aware and we would call her back if there were any additional recommendations.

## 2019-02-23 NOTE — Patient Instructions (Addendum)
We reviewed your blood work .    All other Health Maintenance issues reviewed.   All recommended immunizations and age-appropriate screenings are up-to-date or discussed.  No immunizations administered today.   Medications reviewed and updated.  Changes include :   none   A thyroid ultrasound was ordered.  Someone will call you to schedule this.  Please followup in one year   Health Maintenance, Female Adopting a healthy lifestyle and getting preventive care can go a long way to promote health and wellness. Talk with your health care provider about what schedule of regular examinations is right for you. This is a good chance for you to check in with your provider about disease prevention and staying healthy. In between checkups, there are plenty of things you can do on your own. Experts have done a lot of research about which lifestyle changes and preventive measures are most likely to keep you healthy. Ask your health care provider for more information. Weight and diet Eat a healthy diet  Be sure to include plenty of vegetables, fruits, low-fat dairy products, and lean protein.  Do not eat a lot of foods high in solid fats, added sugars, or salt.  Get regular exercise. This is one of the most important things you can do for your health. ? Most adults should exercise for at least 150 minutes each week. The exercise should increase your heart rate and make you sweat (moderate-intensity exercise). ? Most adults should also do strengthening exercises at least twice a week. This is in addition to the moderate-intensity exercise. Maintain a healthy weight  Body mass index (BMI) is a measurement that can be used to identify possible weight problems. It estimates body fat based on height and weight. Your health care provider can help determine your BMI and help you achieve or maintain a healthy weight.  For females 36 years of age and older: ? A BMI below 18.5 is considered underweight. ? A  BMI of 18.5 to 24.9 is normal. ? A BMI of 25 to 29.9 is considered overweight. ? A BMI of 30 and above is considered obese. Watch levels of cholesterol and blood lipids  You should start having your blood tested for lipids and cholesterol at 64 years of age, then have this test every 5 years.  You may need to have your cholesterol levels checked more often if: ? Your lipid or cholesterol levels are high. ? You are older than 64 years of age. ? You are at high risk for heart disease. Cancer screening Lung Cancer  Lung cancer screening is recommended for adults 86-77 years old who are at high risk for lung cancer because of a history of smoking.  A yearly low-dose CT scan of the lungs is recommended for people who: ? Currently smoke. ? Have quit within the past 15 years. ? Have at least a 30-pack-year history of smoking. A pack year is smoking an average of one pack of cigarettes a day for 1 year.  Yearly screening should continue until it has been 15 years since you quit.  Yearly screening should stop if you develop a health problem that would prevent you from having lung cancer treatment. Breast Cancer  Practice breast self-awareness. This means understanding how your breasts normally appear and feel.  It also means doing regular breast self-exams. Let your health care provider know about any changes, no matter how small.  If you are in your 20s or 30s, you should have a clinical breast  exam (CBE) by a health care provider every 1-3 years as part of a regular health exam.  If you are 34 or older, have a CBE every year. Also consider having a breast X-ray (mammogram) every year.  If you have a family history of breast cancer, talk to your health care provider about genetic screening.  If you are at high risk for breast cancer, talk to your health care provider about having an MRI and a mammogram every year.  Breast cancer gene (BRCA) assessment is recommended for women who have  family members with BRCA-related cancers. BRCA-related cancers include: ? Breast. ? Ovarian. ? Tubal. ? Peritoneal cancers.  Results of the assessment will determine the need for genetic counseling and BRCA1 and BRCA2 testing. Cervical Cancer Your health care provider may recommend that you be screened regularly for cancer of the pelvic organs (ovaries, uterus, and vagina). This screening involves a pelvic examination, including checking for microscopic changes to the surface of your cervix (Pap test). You may be encouraged to have this screening done every 3 years, beginning at age 41.  For women ages 54-65, health care providers may recommend pelvic exams and Pap testing every 3 years, or they may recommend the Pap and pelvic exam, combined with testing for human papilloma virus (HPV), every 5 years. Some types of HPV increase your risk of cervical cancer. Testing for HPV may also be done on women of any age with unclear Pap test results.  Other health care providers may not recommend any screening for nonpregnant women who are considered low risk for pelvic cancer and who do not have symptoms. Ask your health care provider if a screening pelvic exam is right for you.  If you have had past treatment for cervical cancer or a condition that could lead to cancer, you need Pap tests and screening for cancer for at least 20 years after your treatment. If Pap tests have been discontinued, your risk factors (such as having a new sexual partner) need to be reassessed to determine if screening should resume. Some women have medical problems that increase the chance of getting cervical cancer. In these cases, your health care provider may recommend more frequent screening and Pap tests. Colorectal Cancer  This type of cancer can be detected and often prevented.  Routine colorectal cancer screening usually begins at 64 years of age and continues through 64 years of age.  Your health care provider may  recommend screening at an earlier age if you have risk factors for colon cancer.  Your health care provider may also recommend using home test kits to check for hidden blood in the stool.  A small camera at the end of a tube can be used to examine your colon directly (sigmoidoscopy or colonoscopy). This is done to check for the earliest forms of colorectal cancer.  Routine screening usually begins at age 51.  Direct examination of the colon should be repeated every 5-10 years through 64 years of age. However, you may need to be screened more often if early forms of precancerous polyps or small growths are found. Skin Cancer  Check your skin from head to toe regularly.  Tell your health care provider about any new moles or changes in moles, especially if there is a change in a mole's shape or color.  Also tell your health care provider if you have a mole that is larger than the size of a pencil eraser.  Always use sunscreen. Apply sunscreen liberally and repeatedly  throughout the day.  Protect yourself by wearing long sleeves, pants, a wide-brimmed hat, and sunglasses whenever you are outside. Heart disease, diabetes, and high blood pressure  High blood pressure causes heart disease and increases the risk of stroke. High blood pressure is more likely to develop in: ? People who have blood pressure in the high end of the normal range (130-139/85-89 mm Hg). ? People who are overweight or obese. ? People who are African American.  If you are 60-26 years of age, have your blood pressure checked every 3-5 years. If you are 52 years of age or older, have your blood pressure checked every year. You should have your blood pressure measured twice-once when you are at a hospital or clinic, and once when you are not at a hospital or clinic. Record the average of the two measurements. To check your blood pressure when you are not at a hospital or clinic, you can use: ? An automated blood pressure  machine at a pharmacy. ? A home blood pressure monitor.  If you are between 79 years and 59 years old, ask your health care provider if you should take aspirin to prevent strokes.  Have regular diabetes screenings. This involves taking a blood sample to check your fasting blood sugar level. ? If you are at a normal weight and have a low risk for diabetes, have this test once every three years after 64 years of age. ? If you are overweight and have a high risk for diabetes, consider being tested at a younger age or more often. Preventing infection Hepatitis B  If you have a higher risk for hepatitis B, you should be screened for this virus. You are considered at high risk for hepatitis B if: ? You were born in a country where hepatitis B is common. Ask your health care provider which countries are considered high risk. ? Your parents were born in a high-risk country, and you have not been immunized against hepatitis B (hepatitis B vaccine). ? You have HIV or AIDS. ? You use needles to inject street drugs. ? You live with someone who has hepatitis B. ? You have had sex with someone who has hepatitis B. ? You get hemodialysis treatment. ? You take certain medicines for conditions, including cancer, organ transplantation, and autoimmune conditions. Hepatitis C  Blood testing is recommended for: ? Everyone born from [birth year] through 1966. ? Anyone with known risk factors for hepatitis C. Sexually transmitted infections (STIs)  You should be screened for sexually transmitted infections (STIs) including gonorrhea and chlamydia if: ? You are sexually active and are younger than 63 years of age. ? You are older than 64 years of age and your health care provider tells you that you are at risk for this type of infection. ? Your sexual activity has changed since you were last screened and you are at an increased risk for chlamydia or gonorrhea. Ask your health care provider if you are at risk.  If  you do not have HIV, but are at risk, it may be recommended that you take a prescription medicine daily to prevent HIV infection. This is called pre-exposure prophylaxis (PrEP). You are considered at risk if: ? You are sexually active and do not regularly use condoms or know the HIV status of your partner(s). ? You take drugs by injection. ? You are sexually active with a partner who has HIV. Talk with your health care provider about whether you are at high risk of  being infected with HIV. If you choose to begin PrEP, you should first be tested for HIV. You should then be tested every 3 months for as long as you are taking PrEP. Pregnancy  If you are premenopausal and you may become pregnant, ask your health care provider about preconception counseling.  If you may become pregnant, take 400 to 800 micrograms (mcg) of folic acid every day.  If you want to prevent pregnancy, talk to your health care provider about birth control (contraception). Osteoporosis and menopause  Osteoporosis is a disease in which the bones lose minerals and strength with aging. This can result in serious bone fractures. Your risk for osteoporosis can be identified using a bone density scan.  If you are 61 years of age or older, or if you are at risk for osteoporosis and fractures, ask your health care provider if you should be screened.  Ask your health care provider whether you should take a calcium or vitamin D supplement to lower your risk for osteoporosis.  Menopause may have certain physical symptoms and risks.  Hormone replacement therapy may reduce some of these symptoms and risks. Talk to your health care provider about whether hormone replacement therapy is right for you. Follow these instructions at home:  Schedule regular health, dental, and eye exams.  Stay current with your immunizations.  Do not use any tobacco products including cigarettes, chewing tobacco, or electronic cigarettes.  If you are  pregnant, do not drink alcohol.  If you are breastfeeding, limit how much and how often you drink alcohol.  Limit alcohol intake to no more than 1 drink per day for nonpregnant women. One drink equals 12 ounces of beer, 5 ounces of wine, or 1 ounces of hard liquor.  Do not use street drugs.  Do not share needles.  Ask your health care provider for help if you need support or information about quitting drugs.  Tell your health care provider if you often feel depressed.  Tell your health care provider if you have ever been abused or do not feel safe at home. This information is not intended to replace advice given to you by your health care provider. Make sure you discuss any questions you have with your health care provider. Document Released: 06/19/2011 Document Revised: 05/11/2016 Document Reviewed: 09/07/2015 Elsevier Interactive Patient Education  2019 [employer].

## 2019-02-23 NOTE — Progress Notes (Signed)
Subjective:    Patient ID: Tiffany Moran, female    DOB: 30-Oct-1956, 63 y.o.   MRN: [id number]  HPI She is here for a physical exam.   In scratch that last month she developed painful patches or nodules on both of her hands.  They are hard nodules.  She denies skin changes elsewhere.  She did have 1 prior episode last year.  She thinks they are getting better.  She did try applying hydrocortisone cream and Vaseline and thinks that may have helped or they just got better on their own.    She denies joint pain, joint swelling and muscle aches.  She denies any skin changes or rashes on her face.  She has no other concerns.  Her anxiety has improved since getting her blood pressure well controlled-she just saw Dr. Harrington Challenger.  Medications and allergies reviewed with patient and updated if appropriate.  Patient Active Problem List   Diagnosis Date Noted  . Hypertension 01/22/2019  . Chronic sinusitis 09/11/2018  . 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 Eustachian tube dysfunction, right 04/17/2018  . Anterior neck pain 04/10/2018  . Multiple thyroid nodules 12/03/2017  . Anxiety 09/26/2017  . Liver hemangioma 12/05/2016  . Renal cyst, right 12/05/2016  . 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 Pes anserine bursitis 04/21/2014  . 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 Osteopenia 08/04/2011  . Unspecified vitamin D deficiency 08/04/2011  . History of mitral valve prolapse 07/26/2010  . 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 Raynaud's syndrome 01/05/2010  . Hyperlipidemia 08/17/2009  . POLYARTHRITIS 08/17/2009    Current Outpatient Medications on File Prior to Visit  Medication Sig Dispense Refill  . ALPRAZolam (XANAX) 0.5 MG tablet TAKE ONE TABLET BY MOUTH EVERY 8 HOURS AS NEEDED FOR ANXIETY 15 tablet 0  . Cholecalciferol (VITAMIN D) 2000 units CAPS Take 2,000 Units by mouth daily.    Marland Kitchen diltiazem (CARDIZEM) 60 MG tablet Take 1 tablet (60 mg total) by mouth 3  (three) times daily. (Patient taking differently: Take 30 mg by mouth 2 (two) times daily. Takes 30 mg BID) 90 tablet 11  . metoprolol succinate (TOPROL-XL) 25 MG 24 hr tablet Take 1 tablet (25 mg total) by mouth 2 (two) times daily. 180 tablet 1  . NON FORMULARY Hydro-eye 2 capsules daily    . rosuvastatin (CRESTOR) 10 MG tablet Take 1 tablet (10 mg total) by mouth daily. 90 tablet 3   No current facility-administered medications on file prior to visit.     Past Medical History:  Diagnosis Date  . Cardiospasm 1983   felt may be related to MVP  . Hyperlipidemia   . Kidney cysts 11/2015   cyst on one of her kidneys, no treatment required  . Mitral valve prolapse   . PONV (postoperative nausea and vomiting)    teeth chipped  . Raynaud disease   . Retinal hole of right eye   . Shingles 2012   leg  . Tachycardia     Past Surgical History:  Procedure Laterality Date  . COLONOSCOPY  2007   [place], [place] ,[name]  . DILATATION & CURETTAGE/HYSTEROSCOPY WITH MYOSURE N/A 08/29/2016   Procedure: DILATATION & CURETTAGE/HYSTEROSCOPY;  Surgeon: Salvadore Dom, MD;  Location: [facility];  Service: Gynecology;  Laterality: N/A;  . DILATION AND CURETTAGE OF UTERUS    . EXCISION OF SKIN TAG Left 08/29/2016   Procedure: EXCISION OF SKIN TAG left labia majora;  Surgeon: Salvadore Dom, MD;  Location: [facility];  Service: Gynecology;  Laterality: Left;  . EXPLORATORY LAPAROTOMY     X2 for pain: fibroid, enlarged oviduct  . RETINAL LASER PROCEDURE Right 08/2015  . SALPINGECTOMY  2007 or 2008   and fibroid removed  . TUBAL LIGATION      Social History   Socioeconomic History  . Marital status: Married    Spouse name: Not on file  . Number of children: Not on file  . Years of education: Not on file  . Highest education level: Not on file  Occupational History  . Not on file  Social Needs  . Financial resource strain: Not on file  . Food insecurity:    Worry: Not on file    Inability:  Not on file  . Transportation needs:    Medical: Not on file    Non-medical: Not on file  Tobacco Use  . Smoking status: Never Smoker  . Smokeless tobacco: Never Used  Substance and Sexual Activity  . Alcohol use: No  . Drug use: No  . Sexual activity: Yes    Birth control/protection: Other-see comments, Post-menopausal    Comment: BTL  Lifestyle  . Physical activity:    Days per week: Not on file    Minutes per session: Not on file  . Stress: Not on file  Relationships  . Social connections:    Talks on phone: Not on file    Gets together: Not on file    Attends religious service: Not on file    Active member of club or organization: Not on file    Attends meetings of clubs or organizations: Not on file    Relationship status: Not on file  Other Topics Concern  . Not on file  Social History Narrative   Exercises regularly    Family History  Problem Relation Age of Onset  . Heart attack Father 52  . Myasthenia gravis Father   . Hypertension Father   . Hyperlipidemia Father   . Osteoporosis Father        due to steroids for Myasthenia Grvis  . Leukemia Mother 3  . Hyperlipidemia Mother   . Hypertension Mother   . Hypothyroidism Mother   . Prostate cancer Brother        in 62s  . Hyperlipidemia Brother   . Stroke Maternal Grandfather        in 44s  . Diabetes Neg Hx     Review of Systems  Constitutional: Negative for chills and fever.  Eyes: Negative for visual disturbance.  Respiratory: Negative for cough, shortness of breath and wheezing.   Cardiovascular: Positive for palpitations (decreased). Negative for chest pain and leg swelling.  Gastrointestinal: Negative for abdominal pain, blood in stool, constipation, diarrhea and nausea.       No gerd  Genitourinary: Negative for dysuria and hematuria.  Musculoskeletal: Negative for arthralgias, joint swelling and myalgias.  Skin: Positive for color change (hands).  Neurological: Negative for dizziness,  light-headedness and headaches.  Psychiatric/Behavioral: Negative for dysphoric mood. The patient is not nervous/anxious.        Objective:   Vitals:   02/24/19 0809  BP: 122/78  Pulse: 67  Resp: 16  Temp: 98.4 F (36.9 C)  SpO2: 99%   Filed Weights   02/24/19 0809  Weight: 148 lb (67.1 kg)   Body mass index is 24.25 kg/m.  BP Readings from Last 3 Encounters:  02/24/19 122/78  02/17/19 (!) 172/81  01/22/19 (!) 162/70  Wt Readings from Last 3 Encounters:  02/24/19 148 lb (67.1 kg)  02/17/19 145 lb 12.8 oz (66.1 kg)  01/22/19 145 lb 12.8 oz (66.1 kg)     Physical Exam Constitutional: She appears well-developed and well-nourished. No distress.  HENT:  Head: Normocephalic and atraumatic.  Right Ear: External ear normal. Normal ear canal and TM Left Ear: External ear normal.  Normal ear canal and TM Mouth/Throat: Oropharynx is clear and moist.  Eyes: Conjunctivae and EOM are normal.  Neck: Neck supple. No tracheal deviation present. No thyromegaly present.  No carotid bruit  Cardiovascular: Normal rate, regular rhythm and normal heart sounds.   No murmur heard.  No edema. Pulmonary/Chest: Effort normal and breath sounds normal. No respiratory distress. She has no wheezes. She has no rales.  Breast: deferred to Gyn Abdominal: Soft. She exhibits no distension. There is no tenderness.  Lymphadenopathy: She has no cervical adenopathy.  Skin: Skin is warm and dry. She is not diaphoretic.  Firm, beige colored small nodules and several fingers, mild purplish discoloration of PIP joints Psychiatric: She has a normal mood and affect. Her behavior is normal.        Assessment & Plan:   Physical exam: Screening blood work   reviewed Immunizations  Discussed shingrix, declined flu Colonoscopy    Up to date  Mammogram   Up to date  Gyn   Up to date  Dexa   Up to date  Eye exams   Up to date  EKG   01/2019 Exercise  Walks regularly  - 45 min- 1 hr a day Weight    Normal BMI Skin  - nodules on hands. Substance abuse  none  See Problem List for Assessment and Plan of chronic medical problems.   Follow-up annually, sooner if needed

## 2019-02-24 ENCOUNTER — Encounter: Payer: Self-pay | Admitting: Internal Medicine

## 2019-02-24 ENCOUNTER — Ambulatory Visit (INDEPENDENT_AMBULATORY_CARE_PROVIDER_SITE_OTHER): Payer: BLUE CROSS/BLUE SHIELD | Admitting: Internal Medicine

## 2019-02-24 VITALS — BP 122/78 | HR 67 | Temp 98.4°F | Resp 16 | Ht 65.5 in | Wt 148.0 lb

## 2019-02-24 DIAGNOSIS — I471 Supraventricular tachycardia: Secondary | ICD-10-CM

## 2019-02-24 DIAGNOSIS — Z0000 Encounter for general adult medical examination without abnormal findings: Secondary | ICD-10-CM

## 2019-02-24 DIAGNOSIS — F419 Anxiety disorder, unspecified: Secondary | ICD-10-CM

## 2019-02-24 DIAGNOSIS — E785 Hyperlipidemia, unspecified: Secondary | ICD-10-CM | POA: Diagnosis not present

## 2019-02-24 DIAGNOSIS — E042 Nontoxic multinodular goiter: Secondary | ICD-10-CM

## 2019-02-24 DIAGNOSIS — M8580 Other specified disorders of bone density and structure, unspecified site: Secondary | ICD-10-CM

## 2019-02-24 DIAGNOSIS — I10 Essential (primary) hypertension: Secondary | ICD-10-CM

## 2019-02-24 DIAGNOSIS — L944 Gottron's papules: Secondary | ICD-10-CM

## 2019-02-24 NOTE — Assessment & Plan Note (Signed)
Lesions on her fingers look like Gottron's papules ?  Dermatomyositis, lupus ESR was recently negative No clinical symptoms suggestive of myositis or lupus Nodules are improving and she can continue topical creams if they are helping Consider referral to dermatology for biopsy-if her nodules do not resolve or return she needs to get a biopsy-she will call me

## 2019-02-24 NOTE — Assessment & Plan Note (Signed)
She states her anxiety has improved since her blood pressures been up better controlled Alprazolam as needed

## 2019-02-24 NOTE — Assessment & Plan Note (Signed)
Saw Dr. Cruzita Lederer last year and wonders if she needs to see her again Recent TSH normal Will order ultrasound of thyroid-recommended to recheck in 1 year.  Depending on results she can always follow-up with endocrine or we can continue to monitor

## 2019-02-24 NOTE — Assessment & Plan Note (Signed)
Crestor increased by Dr. Harrington Challenger Continue Crestor 10 mg daily

## 2019-02-24 NOTE — Assessment & Plan Note (Signed)
Walking regularly DEXA up-to-date Taking vitamin D daily

## 2019-02-24 NOTE — Assessment & Plan Note (Addendum)
Just recently placed on diltiazem by Dr. Harrington Challenger Blood pressure looks good She will continue her current medications CMP

## 2019-02-25 ENCOUNTER — Other Ambulatory Visit: Payer: Self-pay

## 2019-02-25 MED ORDER — DILTIAZEM HCL 60 MG PO TABS: 30.0000 mg | ORAL_TABLET | Freq: Two times a day (BID) | ORAL | 3 refills | Status: DC

## 2019-02-27 ENCOUNTER — Other Ambulatory Visit: Payer: Self-pay

## 2019-02-27 ENCOUNTER — Ambulatory Visit (INDEPENDENT_AMBULATORY_CARE_PROVIDER_SITE_OTHER): Payer: BLUE CROSS/BLUE SHIELD

## 2019-02-27 DIAGNOSIS — R002 Palpitations: Secondary | ICD-10-CM | POA: Diagnosis not present

## 2019-02-27 DIAGNOSIS — R0602 Shortness of breath: Secondary | ICD-10-CM | POA: Diagnosis not present

## 2019-03-17 DIAGNOSIS — R002 Palpitations: Secondary | ICD-10-CM | POA: Diagnosis not present

## 2019-03-19 ENCOUNTER — Telehealth: Payer: Self-pay

## 2019-03-19 NOTE — Telephone Encounter (Signed)
Gave pt her monitor results per Dr. Harrington Challenger.. pt requests to let Dr. Harrington Challenger know that initially when she made her med changes she was feeling well but her palpitations have returned, not as bad but still having them... I reassured her that her monitor was reassuring to Dr. Harrington Challenger but I will forward her message to her.. pt to continue to monitor and will call if her symptoms worsen... pt reports that she has been very anxious about the pandemic and attributes some of her symptoms to anxiety.  Pt has f/u with Dr. Harrington Challenger 04/14/19.Marland Kitchen she is willing to do a virtual visit but if can see Dr. Harrington Challenger in person she would prefer that.. she requested to have an appt for April and May but I advised her best to wait until her visit is closer and we will assess what is best for her since the situation seems to be changing daily... she agreed and is willing to do the virtual visit if necessary..  I explained the process and went over her consent and advised her I will send in [facility] Chart for her review and as her consent.

## 2019-03-19 NOTE — Telephone Encounter (Signed)
-----   Message from Fay Records, MD sent at 03/18/2019 10:38 PM EDT ----- Sinus rhythm NO significant arrhythmias detected Keep on same meds

## 2019-03-24 ENCOUNTER — Other Ambulatory Visit: Payer: Self-pay | Admitting: Internal Medicine

## 2019-04-03 ENCOUNTER — Telehealth: Payer: Self-pay | Admitting: Internal Medicine

## 2019-04-03 NOTE — Telephone Encounter (Signed)
I spoke with the pt and she is reporting an episode this morning that lasted longer than usual.. for about a few extra seconds.. she felt her heart do a "trick" and she usually could cough and make it go away but it did not go away as east as usual..  She feels the Diltiazem worked the first week after starting it but now it seems to not help at all.. she is only taking it BID because she had a nose bleed so she decreased it..this happened weeks ago.  Advised pt to take it easy the rest if the morning and to avoid caffeine which she says she does.. Will forward to Dr. Harrington Challenger for her review.  Talked to the pt about making her 04/14/19 a virtual visit but she is not happy with that... I explained to her the risk vs, benefit and she agreed... she says she already had the information sent to her. I advised her we will be in touch prior to the visit.

## 2019-04-03 NOTE — Telephone Encounter (Signed)
Called pt  I have encouraged her to keep on metoprolol and diltiazem She says she had more than a skip today I have not seen any signif arrhythmias so far  Recomm:   Apple 4 or 5 watch so she can capture Stay active Stay hydrated Consider meditation/biofeedback All people have skips   Have not so far seen signif arrhythmia

## 2019-04-03 NOTE — Telephone Encounter (Signed)
New Message    Patient c/o Palpitations:  High priority if patient c/o lightheadedness, shortness of breath, or chest pain  1) How long have you had palpitations/irregular HR/ Afib? Are you having the symptoms now? Has a history, but just had an episode   2) Are you currently experiencing lightheadedness, SOB or CP? No   3) Do you have a history of afib (atrial fibrillation) or irregular heart rhythm? Yes  4) Have you checked your BP or HR? (document readings if available): No   Are you experiencing any other symptoms? Not now, while she was having the episode she felt like she was going to pass out

## 2019-04-07 ENCOUNTER — Other Ambulatory Visit: Payer: BLUE CROSS/BLUE SHIELD

## 2019-04-09 ENCOUNTER — Telehealth: Payer: Self-pay

## 2019-04-09 NOTE — Telephone Encounter (Signed)
Called pt in attempts to switch her 4/27 OV to Virtual  Call was disconnected, attempted to call pt back but VM not set up so unable to leave message

## 2019-04-09 NOTE — Telephone Encounter (Signed)
Virtual Visit Pre-Appointment Phone Call  "(Name), I am calling you today to discuss your upcoming appointment. We are currently trying to limit exposure to the virus that causes COVID-19 by seeing patients at home rather than in the office."  1. "What is the BEST phone number to call the day of the visit?" - include this in appointment notes  2. "Do you have or have access to (through a family member/friend) a smartphone with video capability that we can use for your visit?" a. If yes - list this number in appt notes as "cell" (if different from BEST phone #) and list the appointment type as a VIDEO visit in appointment notes b. If no - list the appointment type as a PHONE visit in appointment notes  3. Confirm consent - "In the setting of the current Covid19 crisis, you are scheduled for a VIDEO visit with your provider on 4/23 at 8am.  Just as we do with many in-office visits, in order for you to participate in this visit, we must obtain consent.  If you'd like, I can send this to your mychart (if signed up) or email for you to review.  Otherwise, I can obtain your verbal consent now.  All virtual visits are billed to your insurance company just like a normal visit would be.  By agreeing to a virtual visit, we'd like you to understand that the technology does not allow for your provider to perform an examination, and thus may limit your provider's ability to fully assess your condition. If your provider identifies any concerns that need to be evaluated in person, we Tiffany make arrangements to do so.  Finally, though the technology is pretty good, we cannot assure that it Tiffany always work on either your or our end, and in the setting of a video visit, we may have to convert it to a phone-only visit.  In either situation, we cannot ensure that we have a secure connection.  Are you willing to proceed?" STAFF: Did the patient verbally acknowledge consent to telehealth visit? Document YES/NO here: YES  4. Advise patient to be prepared - "Two hours prior to your appointment, go ahead and check your blood pressure, pulse, oxygen saturation, and your weight (if you have the equipment to check those) and write them all down. When your visit starts, your provider Tiffany ask you for this information. If you have an Apple Watch or Kardia device, please plan to have heart rate information ready on the day of your appointment. Please have a pen and paper handy nearby the day of the visit as well."  5. Give patient instructions for MyChart download to smartphone OR [email or web address] as below if video visit (depending on what platform provider is using)  6. Inform patient they Tiffany receive a phone call 15 minutes prior to their appointment time (may be from unknown caller ID) so they should be prepared to answer    TELEPHONE CALL NOTE  Tiffany Moran has been deemed a candidate for a follow-up tele-health visit to limit community exposure during the Covid-19 pandemic. I spoke with the patient via phone to ensure availability of phone/video source, confirm preferred email & phone number, and discuss instructions and expectations.  I reminded Tiffany Moran to be prepared with any vital sign and/or heart rhythm information that could potentially be obtained via home monitoring, at the time of her visit. I reminded Tiffany Moran to expect a phone call prior to her visit.  Tiffany Moran Tiffany Gallo,  Tiffany Moran 04/09/2019 1:12 PM  .

## 2019-04-09 NOTE — Telephone Encounter (Signed)
Follow up: ° ° ° °Patient returning your call back.  °

## 2019-04-10 ENCOUNTER — Other Ambulatory Visit: Payer: Self-pay

## 2019-04-10 ENCOUNTER — Telehealth (INDEPENDENT_AMBULATORY_CARE_PROVIDER_SITE_OTHER): Payer: BLUE CROSS/BLUE SHIELD | Admitting: Internal Medicine

## 2019-04-10 ENCOUNTER — Encounter: Payer: Self-pay | Admitting: Internal Medicine

## 2019-04-10 ENCOUNTER — Telehealth: Payer: BLUE CROSS/BLUE SHIELD | Admitting: Internal Medicine

## 2019-04-10 VITALS — BP 137/76 | HR 68 | Ht 65.5 in | Wt 134.0 lb

## 2019-04-10 DIAGNOSIS — E782 Mixed hyperlipidemia: Secondary | ICD-10-CM | POA: Diagnosis not present

## 2019-04-10 DIAGNOSIS — I10 Essential (primary) hypertension: Secondary | ICD-10-CM

## 2019-04-10 DIAGNOSIS — R002 Palpitations: Secondary | ICD-10-CM

## 2019-04-10 DIAGNOSIS — I2510 Atherosclerotic heart disease of native coronary artery without angina pectoris: Secondary | ICD-10-CM

## 2019-04-10 MED ORDER — ROSUVASTATIN CALCIUM 5 MG PO TABS: 5.0000 mg | ORAL_TABLET | Freq: Every day | ORAL | 3 refills | Status: DC

## 2019-04-10 NOTE — Progress Notes (Signed)
Virtual Visit via Telephone Note   This visit type was conducted due to national recommendations for restrictions regarding the COVID-19 Pandemic (e.g. social distancing) in an effort to limit this patient's exposure and mitigate transmission in our community.  Due to her co-morbid illnesses, this patient is at least at moderate risk for complications without adequate follow up.  This format is felt to be most appropriate for this patient at this time.  The patient did not have access to video technology/had technical difficulties with video requiring transitioning to audio format only (telephone).  All issues noted in this document were discussed and addressed.  No physical exam could be performed with this format.  Please refer to the patient's chart for her  consent to telehealth for [facility].   Evaluation Performed:  Follow-up visit  Date:  04/10/2019   ID:  Tiffany Moran, DOB [birth year], MRN [id number]  Patient Location: Home Provider Location: Home  PCP:  Binnie Rail, MD  Cardiologist:  Dorris Carnes, MD Electrophysiologist:  None   Chief Complaint:  F/u of palpitations  History of Present Illness:    Tiffany Moran is a 63 y.o. 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    Past Medical History:  Diagnosis Date  . Cardiospasm 1983   felt may be related to  MVP  . Hyperlipidemia   . Kidney cysts 11/2015   cyst on one of her kidneys, no treatment required  . Mitral valve prolapse   . PONV (postoperative nausea and vomiting)    teeth chipped  . Raynaud disease   . Retinal hole of right eye   . Shingles 2012   leg  . Tachycardia    Past Surgical History:  Procedure Laterality Date  . COLONOSCOPY  2007   [place], [place] ,[name]  . DILATATION & CURETTAGE/HYSTEROSCOPY WITH MYOSURE N/A 08/29/2016   Procedure: DILATATION & CURETTAGE/HYSTEROSCOPY;  Surgeon: Salvadore Dom, MD;  Location: [facility];  Service: Gynecology;  Laterality: N/A;  . DILATION AND CURETTAGE OF UTERUS    . EXCISION OF SKIN TAG Left 08/29/2016   Procedure: EXCISION OF SKIN TAG left labia majora;  Surgeon: Salvadore Dom, MD;  Location: [facility];  Service: Gynecology;  Laterality: Left;  . EXPLORATORY LAPAROTOMY     X2 for pain: fibroid, enlarged oviduct  . RETINAL LASER PROCEDURE Right 08/2015  . SALPINGECTOMY  2007 or 2008   and fibroid removed  . TUBAL LIGATION       Current Meds  Medication Sig  . ALPRAZolam (XANAX) 0.5 MG tablet TAKE ONE TABLET BY MOUTH EVERY 8 HOURS AS NEEDED FOR ANXIETY  . Cholecalciferol (VITAMIN D) 2000 units CAPS Take 2,000 Units by  mouth daily.  Marland Kitchen diltiazem (CARDIZEM) 60 MG tablet Take 0.5 tablets (30 mg total) by mouth 2 (two) times daily. Takes 30 mg BID  . metoprolol succinate (TOPROL-XL) 25 MG 24 hr tablet Take 1 tablet (25 mg total) by mouth 2 (two) times daily.  . NON FORMULARY Take 2 capsules by mouth daily. Hydro-eye  . rosuvastatin (CRESTOR) 10 MG tablet Take 1 tablet (10 mg total) by mouth daily.     Allergies:   Lipitor [atorvastatin]   Social History   Tobacco Use  . Smoking status: Never Smoker  . Smokeless tobacco: Never Used  Substance Use Topics  . Alcohol use: No  . Drug use: No     Family Hx: The patient's family history includes Heart attack (age of onset: 67) in her father; Hyperlipidemia in her brother,  father, and mother; Hypertension in her father and mother; Hypothyroidism in her mother; Leukemia (age of onset: 53) in her mother; Myasthenia gravis in her father; Osteoporosis in her father; Prostate cancer in her brother; Stroke in her maternal grandfather. There is no history of Diabetes.  ROS:   Please see the history of present illness.    All other systems reviewed and are negative.   Prior CV studies:   The following studies were reviewed today: Echo and monitor as noted above   Labs/Other Tests and Data Reviewed:    EKG:  Not done as televisit   Recent Labs: 01/22/2019: ALT 28; BUN 19; Creatinine, Ser 0.75; Hemoglobin 14.4; Platelets 170.0; Potassium 4.0; Sodium 141; TSH 2.66   Recent Lipid Panel Lab Results  Component Value Date/Time   CHOL 193 01/22/2019 03:44 PM   CHOL 168 09/25/2017 08:06 AM   TRIG 106.0 01/22/2019 03:44 PM   HDL 66.00 01/22/2019 03:44 PM   HDL 72 09/25/2017 08:06 AM   CHOLHDL 3 01/22/2019 03:44 PM   LDLCALC 106 (H) 01/22/2019 03:44 PM   LDLCALC 79 09/25/2017 08:06 AM    Wt Readings from Last 3 Encounters:  04/10/19 134 lb (60.8 kg)  02/24/19 148 lb (67.1 kg)  02/17/19 145 lb 12.8 oz (66.1 kg)     Objective:    Vital Signs:  BP 137/76   Pulse 68   Ht 5' 5.5" (1.664 m)   Wt 134 lb (60.8 kg)   LMP 06/17/2009 (Approximate)   BMI 21.96 kg/m   Not done as tele visit   ASSESSMENT & PLAN:    1  Palpitations    ONe monitor in past showed isolated PVCs   Recent had no arrhythmia   She is on b blocker and dilt very low dose of each   She is very anxious and very sensitive to meds I encouraged her to continue on these   She could increase toprol in AM and follow     I also encouraged her to get an Apple watch (4 or 5) to capture events she is sensing  2   HL    Patient currently on 10 mg Crestor   She says it makes her feel foggy    I recomm backing down to 5 mg  I would not advance now   CT in 05/2015 calcium score was 8    With very mild  plaque in prox LAD   Would recomm Zetia later this summer     3  CAD   Very mild on CT   Keep on crestor for now     4  HTN   Follow BP  SHe had a few high readings in office but may be triggered by anxiety   Follow at home   5  COVID-19 Education: The signs and symptoms of COVID-19 were discussed with the patient and how to seek care for testing (follow up with PCP or arrange E-visit).  The importance of social distancing was discussed today.  Time:   Today, I have spent 15 minutes with the patient with telehealth technology discussing the above problems.     Medication Adjustments/Labs and Tests Ordered: Current medicines are reviewed at length with the patient today.  Concerns regarding medicines are outlined above.   Tests Ordered: No orders of the defined types were placed in this encounter.    Disposition:  Follow up August Signed, Tiffany Heinle, MD  04/10/2019 8:05 AM    [place]

## 2019-04-10 NOTE — Addendum Note (Signed)
Addended by: Rodman Key on: 04/10/2019 09:31 AM   Modules accepted: Orders

## 2019-04-10 NOTE — Patient Instructions (Signed)
Medication Instructions:  Your physician has recommended you make the following change in your medication:  1.) decrease rosuvastatin (Crestor) to 5 mg once a day 2.) metoprolol - per Dr. Harrington Challenger, if blood pressure is up you are going to take 2 metoprolol in the morning, 1 in the evening.    If you need a refill on your cardiac medications before your next appointment, please call your pharmacy.   Lab work: none If you have labs (blood work) drawn today and your tests are completely normal, you will receive your results only by: Marland Kitchen MyChart Message (if you have MyChart) OR . A paper copy in the mail If you have any lab test that is abnormal or we need to change your treatment, we will call you to review the results.  Testing/Procedures: none  . Follow-Up: . Dr. Harrington Challenger wants to see you back in August.  We are planning to be able to schedule in office visits at that time.  Your next appointment is scheduled for 08/04/19 at 10:40 am.  We cancelled the virtual appointment that was scheduled for July.  Any Other Special Instructions Will Be Listed Below (If Applicable).

## 2019-04-11 ENCOUNTER — Ambulatory Visit: Payer: BLUE CROSS/BLUE SHIELD | Admitting: Internal Medicine

## 2019-04-14 ENCOUNTER — Ambulatory Visit: Payer: BLUE CROSS/BLUE SHIELD | Admitting: Internal Medicine

## 2019-04-17 NOTE — Telephone Encounter (Signed)
I replied to patient's message.  Forwarding to Dr. Harrington Challenger to inform of BP and HR readings.

## 2019-05-15 ENCOUNTER — Telehealth: Payer: Self-pay

## 2019-05-15 NOTE — Telephone Encounter (Signed)
Overdue for f/u appt. Called pt to schedule appt. Pt answered then hung up phone.

## 2019-06-12 ENCOUNTER — Other Ambulatory Visit: Payer: Self-pay

## 2019-06-16 ENCOUNTER — Other Ambulatory Visit (HOSPITAL_COMMUNITY)
Admission: RE | Admit: 2019-06-16 | Discharge: 2019-06-16 | Disposition: A | Discharge status: Home / Self Care | Payer: BC Managed Care – PPO | Source: Ambulatory Visit | Attending: Obstetrics & Gynecology | Admitting: Obstetrics & Gynecology

## 2019-06-16 ENCOUNTER — Encounter: Payer: Self-pay | Admitting: Obstetrics & Gynecology

## 2019-06-16 ENCOUNTER — Other Ambulatory Visit: Payer: Self-pay

## 2019-06-16 ENCOUNTER — Ambulatory Visit (INDEPENDENT_AMBULATORY_CARE_PROVIDER_SITE_OTHER): Payer: BC Managed Care – PPO | Admitting: Obstetrics & Gynecology

## 2019-06-16 VITALS — BP 148/78 | HR 76 | Temp 97.5°F | Ht 65.25 in | Wt 141.0 lb

## 2019-06-16 DIAGNOSIS — Z124 Encounter for screening for malignant neoplasm of cervix: Secondary | ICD-10-CM

## 2019-06-16 DIAGNOSIS — M25541 Pain in joints of right hand: Secondary | ICD-10-CM | POA: Diagnosis not present

## 2019-06-16 DIAGNOSIS — Z01419 Encounter for gynecological examination (general) (routine) without abnormal findings: Secondary | ICD-10-CM | POA: Diagnosis not present

## 2019-06-16 DIAGNOSIS — R3129 Other microscopic hematuria: Secondary | ICD-10-CM | POA: Diagnosis not present

## 2019-06-16 DIAGNOSIS — Z0000 Encounter for general adult medical examination without abnormal findings: Secondary | ICD-10-CM | POA: Diagnosis not present

## 2019-06-16 DIAGNOSIS — M25542 Pain in joints of left hand: Secondary | ICD-10-CM

## 2019-06-16 LAB — POCT URINALYSIS DIPSTICK
Bilirubin, UA: NEGATIVE
Glucose, UA: NEGATIVE
Ketones, UA: NEGATIVE
Leukocytes, UA: NEGATIVE
Nitrite, UA: NEGATIVE
Protein, UA: NEGATIVE
Urobilinogen, UA: 0.2 E.U./dL
pH, UA: 7 (ref 5.0–8.0)

## 2019-06-16 NOTE — Progress Notes (Signed)
63 y.o. G32P2002 Married White or Caucasian female here for annual exam.  Reports in the end of December, she started having increased issues with elevated BPs.  She has experienced more issues with palpitations.  She did have an echo done and wore a monitor for about 10 days.  She wasn't sure that all the palpitations.  Her metoprolol and cardizem has been adjusted.  Has follow up scheduled in August.    She's had three episodes of nose bleeding since March.  Not sure if due to dry nose.    Cholesterol medication has been adjusted as well.  Doesn't feel good on cholesterol medication.  May switch medications.    Feels like she has a little vertigo as well.  Feels like there is some fullness in her right ear.  This is on the same side as the nostril that has the nose bleeding.    Having increased issues with joint pain and swelling in joints.  Left thumb knuckle is more swollen over the past few months.  Using a new tool to do hard work so she can use hands less.   PCP:  Dr. Quay Burow.  Had blood work done in February.  RA, ANA, sed rate was negative.  Would like to see rheumatologist.    Has also had a tick bite this about a month ago.  This was behind her right knee.  Didn't have a rash or any fever afterwards.  Thinks it was a deer tick.  Patient's last menstrual period was 06/17/2009 (approximate).          Sexually active: Yes.    The current method of family planning is post menopausal status.    Exercising: Yes.    walking daily  Smoker:  no  Health Maintenance: Pap:  06/13/18 Neg  06/04/17 Neg. HR HPV:neg History of abnormal Pap:  yes MMG:  06/17/18 BIRADS1:neg. Has appt scheduled  Colonoscopy:  2016 Normal. F/u 10 years  BMD:   06/15/17 Osteopenia  TDaP:  2012 Pneumonia vaccine(s):  no Shingrix:   Declines Hep C testing: 05/29/16 neg Screening Labs: PCP  UA: RBC=Trace   reports that she has never smoked. She has never used smokeless tobacco. She reports that she does not drink alcohol  or use drugs.  Past Medical History:  Diagnosis Date  . Cardiospasm 1983   felt may be related to MVP  . Hyperlipidemia   . Hypertension   . Kidney cysts 11/2015   cyst on one of her kidneys, no treatment required  . Mitral valve prolapse   . PONV (postoperative nausea and vomiting)    teeth chipped  . Raynaud disease   . Retinal hole of right eye   . Shingles 2012   leg  . Tachycardia     Past Surgical History:  Procedure Laterality Date  . COLONOSCOPY  2007   [place], [place] ,[name]  . DILATATION & CURETTAGE/HYSTEROSCOPY WITH MYOSURE N/A 08/29/2016   Procedure: DILATATION & CURETTAGE/HYSTEROSCOPY;  Surgeon: Salvadore Dom, MD;  Location: [facility];  Service: Gynecology;  Laterality: N/A;  . DILATION AND CURETTAGE OF UTERUS    . EXCISION OF SKIN TAG Left 08/29/2016   Procedure: EXCISION OF SKIN TAG left labia majora;  Surgeon: Salvadore Dom, MD;  Location: [facility];  Service: Gynecology;  Laterality: Left;  . EXPLORATORY LAPAROTOMY     X2 for pain: fibroid, enlarged oviduct  . RETINAL LASER PROCEDURE Right 08/2015  . SALPINGECTOMY  2007 or 2008   and  fibroid removed  . TUBAL LIGATION      Current Outpatient Medications  Medication Sig Dispense Refill  . ALPRAZolam (XANAX) 0.5 MG tablet TAKE ONE TABLET BY MOUTH EVERY 8 HOURS AS NEEDED FOR ANXIETY 15 tablet 0  . Cholecalciferol (VITAMIN D) 2000 units CAPS Take 2,000 Units by mouth daily.    Marland Kitchen diltiazem (CARDIZEM) 60 MG tablet Take 0.5 tablets (30 mg total) by mouth 2 (two) times daily. Takes 30 mg BID 30 tablet 3  . metoprolol succinate (TOPROL-XL) 25 MG 24 hr tablet Take 1 tablet (25 mg total) by mouth 2 (two) times daily. 180 tablet 1  . NON FORMULARY Take 2 capsules by mouth daily. Hydro-eye    . rosuvastatin (CRESTOR) 5 MG tablet Take 1 tablet (5 mg total) by mouth daily. 90 tablet 3   No current facility-administered medications for this visit.     Family History  Problem Relation Age of Onset  . Heart attack  Father 70  . Myasthenia gravis Father   . Hypertension Father   . Hyperlipidemia Father   . Osteoporosis Father        due to steroids for Myasthenia Grvis  . Leukemia Mother 53  . Hyperlipidemia Mother   . Hypertension Mother   . Hypothyroidism Mother   . Prostate cancer Brother        in 14s  . Hyperlipidemia Brother   . Stroke Maternal Grandfather        in 2s  . Diabetes Neg Hx     Review of Systems  All other systems reviewed and are negative.   Exam:   BP (!) 148/78   Pulse 76   Temp (!) 97.5 F (36.4 C) (Temporal)   Ht 5' 5.25" (1.657 m)   Wt 141 lb (64 kg)   LMP 06/17/2009 (Approximate)   BMI 23.28 kg/m    Height: 5' 5.25" (165.7 cm)  Ht Readings from Last 3 Encounters:  06/16/19 5' 5.25" (1.657 m)  04/10/19 5' 5.5" (1.664 m)  02/24/19 5' 5.5" (1.664 m)    General appearance: alert, cooperative and appears stated age Head: Normocephalic, without obvious abnormality, atraumatic Neck: no adenopathy, supple, symmetrical, trachea midline and thyroid normal to inspection and palpation Lungs: clear to auscultation bilaterally Breasts: normal appearance, no masses or tenderness Heart: regular rate and rhythm Abdomen: soft, non-tender; bowel sounds normal; no masses,  no organomegaly Extremities: extremities normal, atraumatic, no cyanosis or edema Skin: Skin color, texture, turgor normal. No rashes or lesions Lymph nodes: Cervical, supraclavicular, and axillary nodes normal. No abnormal inguinal nodes palpated Neurologic: Grossly normal   Pelvic: External genitalia:  no lesions              Urethra:  normal appearing urethra with no masses, tenderness or lesions              Bartholins and Skenes: normal                 Vagina: normal appearing vagina with normal color and discharge, no lesions              Cervix: no lesions              Pap taken: Yes.   Bimanual Exam:  Uterus:  normal size, contour, position, consistency, mobility, non-tender               Adnexa: normal adnexa and no mass, fullness, tenderness  Rectovaginal: Confirms               Anus:  normal sphincter tone, no lesions  Chaperone was present for exam.  A:  Well Woman with normal exam PMP, no HRT H/o atrophic vagitis Hypertension Elevated lipids Borderline MVP Joint pain, joint swelling esp in her hands H/O thyroid nodules  P:   Mammogram guidelines reviewed.  Has scheduled for next months pap smear obtained today due to pt preference.  HR HPV obtained 2018. Lab work done with Dr. Quay Burow in 2/19 Declines shingrix vaccination Referral to rheumatology for possible consultation Thyroid ultrasound is scheduled for September return annually or prn

## 2019-06-17 LAB — CYTOLOGY - PAP: Diagnosis: NEGATIVE

## 2019-06-17 LAB — URINE CULTURE: Organism ID, Bacteria: NO GROWTH

## 2019-06-17 LAB — URINALYSIS, MICROSCOPIC ONLY: Casts: NONE SEEN /lpf

## 2019-06-18 ENCOUNTER — Other Ambulatory Visit: Payer: BLUE CROSS/BLUE SHIELD

## 2019-06-23 ENCOUNTER — Other Ambulatory Visit: Payer: Self-pay | Admitting: Internal Medicine

## 2019-06-23 ENCOUNTER — Encounter: Payer: Self-pay | Admitting: Obstetrics & Gynecology

## 2019-06-23 DIAGNOSIS — Z1231 Encounter for screening mammogram for malignant neoplasm of breast: Secondary | ICD-10-CM | POA: Diagnosis not present

## 2019-06-23 LAB — HM MAMMOGRAPHY

## 2019-06-25 ENCOUNTER — Encounter: Payer: Self-pay | Admitting: Internal Medicine

## 2019-06-27 ENCOUNTER — Telehealth: Payer: BLUE CROSS/BLUE SHIELD | Admitting: Internal Medicine

## 2019-06-27 ENCOUNTER — Ambulatory Visit: Payer: BLUE CROSS/BLUE SHIELD | Admitting: Obstetrics & Gynecology

## 2019-07-02 DIAGNOSIS — H35371 Puckering of macula, right eye: Secondary | ICD-10-CM | POA: Diagnosis not present

## 2019-07-02 DIAGNOSIS — H04123 Dry eye syndrome of bilateral lacrimal glands: Secondary | ICD-10-CM | POA: Diagnosis not present

## 2019-07-02 DIAGNOSIS — H2513 Age-related nuclear cataract, bilateral: Secondary | ICD-10-CM | POA: Diagnosis not present
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
## 2019-07-22 ENCOUNTER — Other Ambulatory Visit: Payer: Self-pay | Admitting: Internal Medicine

## 2019-07-25 ENCOUNTER — Ambulatory Visit: Payer: Self-pay

## 2019-07-25 ENCOUNTER — Encounter: Payer: Self-pay | Admitting: Rheumatology

## 2019-07-25 ENCOUNTER — Other Ambulatory Visit: Payer: Self-pay

## 2019-07-25 ENCOUNTER — Ambulatory Visit (INDEPENDENT_AMBULATORY_CARE_PROVIDER_SITE_OTHER): Payer: BC Managed Care – PPO | Admitting: Rheumatology

## 2019-07-25 VITALS — BP 144/76 | HR 81 | Resp 13 | Ht 65.5 in | Wt 142.0 lb

## 2019-07-25 DIAGNOSIS — M533 Sacrococcygeal disorders, not elsewhere classified: Secondary | ICD-10-CM

## 2019-07-25 DIAGNOSIS — Z79899 Other long term (current) drug therapy: Secondary | ICD-10-CM | POA: Diagnosis not present

## 2019-07-25 DIAGNOSIS — Z8659 Personal history of other mental and behavioral disorders: Secondary | ICD-10-CM

## 2019-07-25 DIAGNOSIS — M7062 Trochanteric bursitis, left hip: Secondary | ICD-10-CM | POA: Diagnosis not present

## 2019-07-25 DIAGNOSIS — Z8679 Personal history of other diseases of the circulatory system: Secondary | ICD-10-CM

## 2019-07-25 DIAGNOSIS — E042 Nontoxic multinodular goiter: Secondary | ICD-10-CM

## 2019-07-25 DIAGNOSIS — M8580 Other specified disorders of bone density and structure, unspecified site: Secondary | ICD-10-CM

## 2019-07-25 DIAGNOSIS — Z8639 Personal history of other endocrine, nutritional and metabolic disease: Secondary | ICD-10-CM

## 2019-07-25 DIAGNOSIS — E559 Vitamin D deficiency, unspecified: Secondary | ICD-10-CM | POA: Diagnosis not present

## 2019-07-25 DIAGNOSIS — D1803 Hemangioma of intra-abdominal structures: Secondary | ICD-10-CM

## 2019-07-25 DIAGNOSIS — M79642 Pain in left hand: Secondary | ICD-10-CM | POA: Diagnosis not present

## 2019-07-25 DIAGNOSIS — I7300 Raynaud's syndrome without gangrene: Secondary | ICD-10-CM

## 2019-07-25 DIAGNOSIS — H33321 Round hole, right eye: Secondary | ICD-10-CM

## 2019-07-25 DIAGNOSIS — M79641 Pain in right hand: Secondary | ICD-10-CM

## 2019-07-25 DIAGNOSIS — N281 Cyst of kidney, acquired: Secondary | ICD-10-CM

## 2019-07-25 DIAGNOSIS — I10 Essential (primary) hypertension: Secondary | ICD-10-CM

## 2019-07-28 NOTE — Progress Notes (Signed)
Dr. Estanislado Pandy will discuss at new patient follow up visit.

## 2019-08-01 LAB — CBC WITH DIFFERENTIAL/PLATELET
Absolute Monocytes: 232 cells/uL (ref 200–950)
Basophils Absolute: 12 cells/uL (ref 0–200)
Basophils Relative: 0.2 %
Eosinophils Absolute: 52 cells/uL (ref 15–500)
Eosinophils Relative: 0.9 %
HCT: 43 % (ref 35.0–45.0)
Hemoglobin: 14.4 g/dL (ref 11.7–15.5)
Lymphs Abs: 1096 cells/uL (ref 850–3900)
MCH: 30.6 pg (ref 27.0–33.0)
MCHC: 33.5 g/dL (ref 32.0–36.0)
MCV: 91.3 fL (ref 80.0–100.0)
MPV: 10.9 fL (ref 7.5–12.5)
Monocytes Relative: 4 %
Neutro Abs: 4408 cells/uL (ref 1500–7800)
Neutrophils Relative %: 76 %
Platelets: 175 10*3/uL (ref 140–400)
RBC: 4.71 10*6/uL (ref 3.80–5.10)
RDW: 12.3 % (ref 11.0–15.0)
Total Lymphocyte: 18.9 %
WBC: 5.8 10*3/uL (ref 3.8–10.8)

## 2019-08-01 LAB — 14-3-3 ETA PROTEIN: 14-3-3 eta Protein: <0.2 ng/mL (ref <0.2)

## 2019-08-01 LAB — SJOGRENS SYNDROME-A EXTRACTABLE NUCLEAR ANTIBODY: SSA (Ro) (ENA) Antibody, IgG: <1 AI

## 2019-08-01 LAB — PROTEIN ELECTROPHORESIS, SERUM, WITH REFLEX
Albumin ELP: 4.6 g/dL (ref 3.8–4.8)
Alpha 1: 0.3 g/dL (ref 0.2–0.3)
Alpha 2: 0.6 g/dL (ref 0.5–0.9)
Beta 2: 0.4 g/dL (ref 0.2–0.5)
Beta Globulin: 0.5 g/dL (ref 0.4–0.6)
Gamma Globulin: 0.9 g/dL (ref 0.8–1.7)
Total Protein: 7.3 g/dL (ref 6.1–8.1)

## 2019-08-01 LAB — SJOGRENS SYNDROME-B EXTRACTABLE NUCLEAR ANTIBODY: SSB (La) (ENA) Antibody, IgG: <1 AI

## 2019-08-01 LAB — COMPLETE METABOLIC PANEL WITH GFR
AG Ratio: 2.1 (calc) (ref 1.0–2.5)
ALT: 19 U/L (ref 6–29)
AST: 17 U/L (ref 10–35)
Albumin: 4.8 g/dL (ref 3.6–5.1)
Alkaline phosphatase (APISO): 75 U/L (ref 37–153)
BUN: 14 mg/dL (ref 7–25)
CO2: 25 mmol/L (ref 20–32)
Calcium: 10 mg/dL (ref 8.6–10.4)
Chloride: 103 mmol/L (ref 98–110)
Creat: 0.75 mg/dL (ref 0.50–0.99)
GFR, Est African American: 98 mL/min/{1.73_m2} (ref >=60)
GFR, Est Non African American: 85 mL/min/{1.73_m2} (ref >=60)
Globulin: 2.3 g/dL (calc) (ref 1.9–3.7)
Glucose, Bld: 95 mg/dL (ref 65–99)
Potassium: 4.1 mmol/L (ref 3.5–5.3)
Sodium: 142 mmol/L (ref 135–146)
Total Bilirubin: 0.7 mg/dL (ref 0.2–1.2)
Total Protein: 7.1 g/dL (ref 6.1–8.1)

## 2019-08-01 LAB — HIV ANTIBODY (ROUTINE TESTING W REFLEX): HIV 1&2 Ab, 4th Generation: NONREACTIVE

## 2019-08-01 LAB — HEPATITIS C ANTIBODY
Hepatitis C Ab: NONREACTIVE
SIGNAL TO CUT-OFF: 0.01 (ref <1.00)

## 2019-08-01 LAB — ANTI-DNA ANTIBODY, DOUBLE-STRANDED: ds DNA Ab: 1 IU/mL

## 2019-08-01 LAB — BETA-2 GLYCOPROTEIN ANTIBODIES
Beta-2 Glyco 1 IgA: 10 SAU (ref <=20)
Beta-2 Glyco 1 IgM: <9 SMU (ref <=20)
Beta-2 Glyco I IgG: <9 SGU (ref <=20)

## 2019-08-01 LAB — LUPUS ANTICOAGULANT EVAL W/ REFLEX

## 2019-08-01 LAB — PAN-ANCA
ANCA Screen: NEGATIVE
Myeloperoxidase Abs: <1 AI
Serine Protease 3: <1 AI

## 2019-08-01 LAB — CYCLIC CITRUL PEPTIDE ANTIBODY, IGG: Cyclic Citrullin Peptide Ab: <16 UNITS

## 2019-08-01 LAB — CARDIOLIPIN ANTIBODIES, IGG, IGM, IGA
Anticardiolipin IgA: <11 [APL'U]
Anticardiolipin IgG: <14 [GPL'U]
Anticardiolipin IgM: <12 [MPL'U]

## 2019-08-01 LAB — URIC ACID: Uric Acid, Serum: 2.8 mg/dL (ref 2.5–7.0)

## 2019-08-01 LAB — C3 AND C4
C3 Complement: 122 mg/dL (ref 83–193)
C4 Complement: 22 mg/dL (ref 15–57)

## 2019-08-01 LAB — TSH: TSH: 1.29 mIU/L (ref 0.40–4.50)

## 2019-08-01 LAB — ANGIOTENSIN CONVERTING ENZYME: Angiotensin-Converting Enzyme: 46 U/L (ref 9–67)

## 2019-08-01 LAB — CK: Total CK: 45 U/L (ref 29–143)

## 2019-08-01 LAB — VITAMIN D 25 HYDROXY (VIT D DEFICIENCY, FRACTURES): Vit D, 25-Hydroxy: 50 ng/mL (ref 30–100)

## 2019-08-01 LAB — IGG, IGA, IGM
IgG (Immunoglobin G), Serum: 983 mg/dL (ref 600–1540)
IgM, Serum: 64 mg/dL (ref 50–300)
Immunoglobulin A: 152 mg/dL (ref 70–320)

## 2019-08-01 LAB — HLA-B27 ANTIGEN: HLA-B27 Antigen: NEGATIVE

## 2019-08-01 LAB — RNP ANTIBODY: Ribonucleic Protein(ENA) Antibody, IgG: <1 AI

## 2019-08-01 LAB — ANTI-SMITH ANTIBODY: ENA SM Ab Ser-aCnc: <1 AI

## 2019-08-01 LAB — GLUCOSE 6 PHOSPHATE DEHYDROGENASE: G-6PDH: 13.3 U/g Hgb (ref 7.0–20.5)

## 2019-08-01 LAB — THIOPURINE METHYLTRANSFERASE (TPMT), RBC: Thiopurine Methyltransferase, RBC: 13 nmol/hr/mL RBC

## 2019-08-01 LAB — ANTI-SCLERODERMA ANTIBODY: Scleroderma (Scl-70) (ENA) Antibody, IgG: <1 AI

## 2019-08-01 LAB — HEPATITIS B CORE ANTIBODY, IGM: Hep B C IgM: NONREACTIVE

## 2019-08-03 NOTE — Progress Notes (Signed)
Cardiology Office Note   Date:  08/03/2019   ID:  Tiffany Moran, DOB October 23, 1956, MRN [id number]  PCP:  Binnie Rail, MD  Cardiologist:   Dorris Carnes, MD   F/U of palpitations and mild CAD (by CT scan in 2016)    History of Present Illness: Tiffany Moran is a 63 y.o. female with a history of palpitations  Also a history of abnormal myovue Cardiac CT showed mild nonobstructive CAD (2016) I saw her in clinic in May 2020   At the time She was complaineng of more palpitations.  BP was also elevated  She was set up for an event monitor   THis showed SR with short bursts of SVT and NSVT   Outpatient Medications Prior to Visit  Medication Sig Dispense Refill  . ALPRAZolam (XANAX) 0.5 MG tablet TAKE ONE TABLET BY MOUTH EVERY 8 HOURS AS NEEDED FOR ANXIETY 15 tablet 0  . Cholecalciferol (VITAMIN D) 2000 units CAPS Take 2,000 Units by mouth daily.    Marland Kitchen diltiazem (CARDIZEM) 60 MG tablet TAKE ONE-HALF TABLET BY MOUTH TWO TIMES A DAY. TAKES 30 MG TWO TIMES A DAY 90 tablet 2  . metoprolol succinate (TOPROL-XL) 25 MG 24 hr tablet TAKE ONE TABLET BY MOUTH TWICE A DAY 180 tablet 1  . NON FORMULARY Take 2 capsules by mouth daily. Hydro-eye    . rosuvastatin (CRESTOR) 5 MG tablet Take 1 tablet (5 mg total) by mouth daily. 90 tablet 3   No facility-administered medications prior to visit.      Allergies:   Lipitor [atorvastatin]   Past Medical History:  Diagnosis Date  . Cardiospasm 1983   felt may be related to MVP  . Hyperlipidemia   . Hypertension   . Kidney cysts 11/2015   cyst on one of her kidneys, no treatment required  . Mitral valve prolapse   . PONV (postoperative nausea and vomiting)    teeth chipped  . Raynaud disease   . Retinal hole of right eye   . Shingles 2012   leg  . Tachycardia     Past Surgical History:  Procedure Laterality Date  . COLONOSCOPY  2007   [place], [place] ,[name]  . DILATATION & CURETTAGE/HYSTEROSCOPY WITH MYOSURE N/A 08/29/2016   Procedure:  DILATATION & CURETTAGE/HYSTEROSCOPY;  Surgeon: Salvadore Dom, MD;  Location: [facility];  Service: Gynecology;  Laterality: N/A;  . DILATION AND CURETTAGE OF UTERUS    . EXCISION OF SKIN TAG Left 08/29/2016   Procedure: EXCISION OF SKIN TAG left labia majora;  Surgeon: Salvadore Dom, MD;  Location: [facility];  Service: Gynecology;  Laterality: Left;  . EXPLORATORY LAPAROTOMY     X2 for pain: fibroid, enlarged oviduct  . RETINAL LASER PROCEDURE Right 08/2015  . SALPINGECTOMY  2007 or 2008   and fibroid removed  . TUBAL LIGATION       Social History:  The patient  reports that she has never smoked. She has never used smokeless tobacco. She reports that she does not drink alcohol or use drugs.   Family History:  The patient's family history includes Healthy in her daughter and son; Heart attack (age of onset: 16) in her father; Hyperlipidemia in her brother, father, and mother; Hypertension in her father and mother; Hypothyroidism in her mother; Leukemia (age of onset: 59) in her mother; Myasthenia gravis in her father; Osteoporosis in her father; Prostate cancer in her brother; Stroke in her maternal grandfather.    ROS:  Please see  the history of present illness. All other systems are reviewed and  Negative to the above problem except as noted.    PHYSICAL EXAM: VS:  LMP 06/17/2009 (Approximate)   GEN: Well nourished, well developed, in no acute distress  HEENT: normal  Neck: JVP is norrmal carotid bruits, or masses Cardiac: RRR; no murmurs, rubs, or gallops,no edema  Respiratory:  clear to auscultation bilaterally, normal work of breathing GI: soft, nontender, nondistended, + BS  No hepatomegaly  MS: no deformity Moving all extremities   Skin: warm and dry, Erythmatous lesions on fingers   (near knuckes) Neuro:  Strength and sensation are intact Psych: euthymic mood, full affect   EKG:  EKG is ordered today.  SR 71 bpm   Lipid Panel    Component Value Date/Time   CHOL 193  01/22/2019 1544   CHOL 168 09/25/2017 0806   TRIG 106.0 01/22/2019 1544   HDL 66.00 01/22/2019 1544   HDL 72 09/25/2017 0806   CHOLHDL 3 01/22/2019 1544   VLDL 21.2 01/22/2019 1544   LDLCALC 106 (H) 01/22/2019 1544   LDLCALC 79 09/25/2017 0806      Wt Readings from Last 3 Encounters:  07/25/19 142 lb (64.4 kg)  06/16/19 141 lb (64 kg)  04/10/19 134 lb (60.8 kg)      ASSESSMENT AND PLAN:  1  Palpitations   Skips sound isolated   Will set up for monitor (2 wk)   Start low dose dilt 30 tid increase to 60 tid  Continue metoprolol     2  Dyspnea   Pt with minimal CAD on CT in 2016   Volume status is OK   Will set up for echo  3  HTN   Add diltiazem  4   Rash   Hands have small lesions that almost look lik old bruises   Will check ANA, ESR, RF      Reassured pt     She is very anxious   Does not want to take meds    F/U in 6 wks     Signed, Dorris Carnes, MD  08/03/2019 4:22 PM    [facility], [place], [place]  [address] Phone: [phone]; Fax: [phone]

## 2019-08-03 NOTE — Progress Notes (Addendum)
Cardiology Office Note   Date:  08/04/2019   ID:  Tiffany Moran, DOB [birth year], MRN [id number]  PCP:  Binnie Rail, MD  Cardiologist:   Dorris Carnes, MD   F/U of palpitations and mild CAD (by CT scan in 2016)    History of Present Illness: Tiffany Moran is a 63 y.o. female with a history of palpitations  Also a history of abnormal myovue Cardiac CT showed mild nonobstructive CAD (2016) I saw her in clinic in March 2020  She has had some skips   Transient  Not caught on watch   Walking 1 to 2 miles per day  The patient thinks this has helped her feel better    [facility] remains anxious about many things (Occasional skip, forgetting things, occasionally having to take a deep breath)    Outpatient Medications Prior to Visit  Medication Sig Dispense Refill  . ALPRAZolam (XANAX) 0.5 MG tablet TAKE ONE TABLET BY MOUTH EVERY 8 HOURS AS NEEDED FOR ANXIETY 15 tablet 0  . Cholecalciferol (VITAMIN D) 2000 units CAPS Take 2,000 Units by mouth daily.    Marland Kitchen diltiazem (CARDIZEM) 60 MG tablet TAKE ONE-HALF TABLET BY MOUTH TWO TIMES A DAY. TAKES 30 MG TWO TIMES A DAY 90 tablet 2  . Loteprednol Etabonate (LOTEMAX OP) Apply to eye as directed.    . metoprolol succinate (TOPROL-XL) 25 MG 24 hr tablet TAKE ONE TABLET BY MOUTH TWICE A DAY 180 tablet 1  . NON FORMULARY Take 2 capsules by mouth daily. Hydro-eye    . rosuvastatin (CRESTOR) 5 MG tablet Take 1 tablet (5 mg total) by mouth daily. 90 tablet 3   No facility-administered medications prior to visit.      Allergies:   Lipitor [atorvastatin]   Past Medical History:  Diagnosis Date  . Cardiospasm 1983   felt may be related to MVP  . Hyperlipidemia   . Hypertension   . Kidney cysts 11/2015   cyst on one of her kidneys, no treatment required  . Mitral valve prolapse   . PONV (postoperative nausea and vomiting)    teeth chipped  . Raynaud disease   . Retinal hole of right eye   . Shingles 2012   leg  . Tachycardia     Past Surgical  History:  Procedure Laterality Date  . COLONOSCOPY  2007   [place], [place] ,[name]  . DILATATION & CURETTAGE/HYSTEROSCOPY WITH MYOSURE N/A 08/29/2016   Procedure: DILATATION & CURETTAGE/HYSTEROSCOPY;  Surgeon: Salvadore Dom, MD;  Location: [facility];  Service: Gynecology;  Laterality: N/A;  . DILATION AND CURETTAGE OF UTERUS    . EXCISION OF SKIN TAG Left 08/29/2016   Procedure: EXCISION OF SKIN TAG left labia majora;  Surgeon: Salvadore Dom, MD;  Location: [facility];  Service: Gynecology;  Laterality: Left;  . EXPLORATORY LAPAROTOMY     X2 for pain: fibroid, enlarged oviduct  . RETINAL LASER PROCEDURE Right 08/2015  . SALPINGECTOMY  2007 or 2008   and fibroid removed  . TUBAL LIGATION       Social History:  The patient  reports that she has never smoked. She has never used smokeless tobacco. She reports that she does not drink alcohol or use drugs.   Family History:  The patient's family history includes Healthy in her daughter and son; Heart attack (age of onset: 10) in her father; Hyperlipidemia in her brother, father, and mother; Hypertension in her father and mother; Hypothyroidism in her mother; Leukemia (age of onset:  74) in her mother; Myasthenia gravis in her father; Osteoporosis in her father; Prostate cancer in her brother; Stroke in her maternal grandfather.    ROS:  Please see the history of present illness. All other systems are reviewed and  Negative to the above problem except as noted.    PHYSICAL EXAM: VS:  BP 118/78   Pulse 81   Ht 5' 5.5" (1.664 m)   Wt 141 lb (64 kg)   LMP 06/17/2009 (Approximate)   SpO2 99%   BMI 23.11 kg/m   GEN: Well nourished, well developed, in no acute distress  HEENT: normal  Neck: JVP is norrmal carotid bruits, or masses Cardiac: RRR; no murmurs, rubs, or gallops,no edema  Respiratory:  clear to auscultation bilaterally, normal work of breathing GI: soft, nontender, nondistended, + BS  No hepatomegaly  MS: no deformity Moving  all extremities   Skin: warm and dry, Erythmatous lesions on fingers   (near knuckes) Neuro:  Strength and sensation are intact Psych: euthymic mood, full affect   EKG:  EKG is not ordered today    Lipid Panel    Component Value Date/Time   CHOL 193 01/22/2019 1544   CHOL 168 09/25/2017 0806   TRIG 106.0 01/22/2019 1544   HDL 66.00 01/22/2019 1544   HDL 72 09/25/2017 0806   CHOLHDL 3 01/22/2019 1544   VLDL 21.2 01/22/2019 1544   LDLCALC 106 (H) 01/22/2019 1544   LDLCALC 79 09/25/2017 0806      Wt Readings from Last 3 Encounters:  08/04/19 141 lb (64 kg)  07/25/19 142 lb (64.4 kg)  06/16/19 141 lb (64 kg)      ASSESSMENT AND PLAN:  1  Palpitations  Skips again remain isolated, short.   No hemodynamic symptoms  2   CAD Ca score of 8 on CT    Minimal CAD of LAD in 2016     Will check lipids today   3  Dyspnea  Pt denies   Echo in March 2020 normal    3  HTN  BP is good       Plan for f/u in 6 months   Signed, Dorris Carnes, MD  08/04/2019 11:30 AM    [facility] [address], [place], [place]  [address] Phone: [phone]; Fax: [phone]

## 2019-08-04 ENCOUNTER — Other Ambulatory Visit: Payer: Self-pay

## 2019-08-04 ENCOUNTER — Ambulatory Visit (INDEPENDENT_AMBULATORY_CARE_PROVIDER_SITE_OTHER): Payer: BC Managed Care – PPO | Admitting: Internal Medicine

## 2019-08-04 ENCOUNTER — Encounter: Payer: Self-pay | Admitting: Internal Medicine

## 2019-08-04 VITALS — BP 118/78 | HR 81 | Ht 65.5 in | Wt 141.0 lb

## 2019-08-04 DIAGNOSIS — E782 Mixed hyperlipidemia: Secondary | ICD-10-CM | POA: Diagnosis not present

## 2019-08-04 NOTE — Patient Instructions (Signed)
Medication Instructions:  No changes today If you need a refill on your cardiac medications before your next appointment, please call your pharmacy.   Lab work: Today: lipids  If you have labs (blood work) drawn today and your tests are completely normal, you will receive your results only by: Marland Kitchen MyChart Message (if you have MyChart) OR . A paper copy in the mail If you have any lab test that is abnormal or we need to change your treatment, we will call you to review the results.  Testing/Procedures: none  Follow-Up: At [facility], you and your health needs are our priority.  As part of our continuing mission to provide you with exceptional heart care, we have created designated Provider Care Teams.  These Care Teams include your primary Cardiologist (physician) and Advanced Practice Providers (APPs -  Physician Assistants and Nurse Practitioners) who all work together to provide you with the care you need, when you need it. You will need a follow up appointment in:  6 months.  Please call our office 2 months in advance to schedule this appointment.  You may see Dorris Carnes, MD or one of the following Advanced Practice Providers on your designated Care Team: Richardson Dopp, PA-C [place], [place] . Daune Perch, NP  Any Other Special Instructions Will Be Listed Below (If Applicable).

## 2019-08-05 LAB — LIPID PANEL
Chol/HDL Ratio: 2.5 ratio (ref 0.0–4.4)
Cholesterol, Total: 208 mg/dL — ABNORMAL HIGH (ref 100–199)
HDL: 83 mg/dL (ref >39)
LDL Calculated: 105 mg/dL — ABNORMAL HIGH (ref 0–99)
Triglycerides: 102 mg/dL (ref 0–149)
VLDL Cholesterol Cal: 20 mg/dL (ref 5–40)

## 2019-08-06 ENCOUNTER — Other Ambulatory Visit: Payer: Self-pay

## 2019-08-06 ENCOUNTER — Telehealth: Payer: Self-pay

## 2019-08-06 DIAGNOSIS — E782 Mixed hyperlipidemia: Secondary | ICD-10-CM

## 2019-08-06 MED ORDER — EZETIMIBE 10 MG PO TABS: 10.0000 mg | ORAL_TABLET | Freq: Every day | ORAL | 3 refills | Status: DC

## 2019-08-06 NOTE — Telephone Encounter (Signed)
The patient has been notified of the result and verbalized understanding.  All questions (if any) were answered. Wilma Flavin, RN 08/06/2019 11:58 AM

## 2019-08-06 NOTE — Progress Notes (Signed)
The patient has been notified of the result and verbalized understanding.  All questions (if any) were answered. Lab work ordered and scheduled for 10/07/2019 Wilma Flavin, RN 08/06/2019 11:20 AM

## 2019-08-11 DIAGNOSIS — H04123 Dry eye syndrome of bilateral lacrimal glands: Secondary | ICD-10-CM | POA: Diagnosis not present

## 2019-08-11 DIAGNOSIS — H2513 Age-related nuclear cataract, bilateral: Secondary | ICD-10-CM | POA: Diagnosis not present

## 2019-08-11 DIAGNOSIS — H43811 Vitreous degeneration, right eye: Secondary | ICD-10-CM | POA: Diagnosis not present

## 2019-08-11 DIAGNOSIS — H35371 Puckering of macula, right eye: Secondary | ICD-10-CM | POA: Diagnosis not present

## 2019-08-15 NOTE — Progress Notes (Signed)
Office Visit Note  Patient: Tiffany Moran             Date of Birth: [birth year]           MRN: [id number]             PCP: Binnie Rail, MD Referring: Binnie Rail, MD Visit Date: 08/27/2019 Occupation: '@GUAROCC'$ @  Subjective:  Pain in both hands.   History of Present Illness: Luciel Brickman is a 63 y.o. female with history of joint pain.  She states she continues to have some thickening and discomfort in her right third MCP joint.  She also has discomfort in her left thumb.  She is ongoing pain in her right trochanteric bursa and lower back.  She denies any joint swelling besides on her right hand.  Activities of Daily Living:  Patient reports morning stiffness for 0 minutes.   Patient Denies nocturnal pain.  Difficulty dressing/grooming: Denies Difficulty climbing stairs: Denies Difficulty getting out of chair: Denies Difficulty using hands for taps, buttons, cutlery, and/or writing: Denies  Review of Systems  Constitutional: Negative for fatigue, night sweats, weight gain and weight loss.  HENT: Negative for mouth sores, trouble swallowing, trouble swallowing, mouth dryness and nose dryness.   Eyes: Positive for dryness. Negative for pain, redness and visual disturbance.  Respiratory: Negative for cough, shortness of breath, wheezing and difficulty breathing.   Cardiovascular: Negative for chest pain, palpitations, hypertension, irregular heartbeat and swelling in legs/feet.  Gastrointestinal: Negative for blood in stool, constipation and diarrhea.  Endocrine: Negative for increased urination.  Genitourinary: Negative for difficulty urinating, painful urination and vaginal dryness.  Musculoskeletal: Positive for arthralgias, joint pain and joint swelling. Negative for myalgias, muscle weakness, morning stiffness, muscle tenderness and myalgias.  Skin: Negative for color change, rash, hair loss, skin tightness, ulcers and sensitivity to sunlight.  Allergic/Immunologic: Negative  for susceptible to infections.  Neurological: Negative for dizziness, light-headedness, headaches, memory loss, night sweats and weakness.  Hematological: Negative for bruising/bleeding tendency and swollen glands.  Psychiatric/Behavioral: Negative for depressed mood, confusion and sleep disturbance. The patient is not nervous/anxious.     PMFS History:  Patient Active Problem List   Diagnosis Date Noted  . 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 Hypertension 01/22/2019  . Chronic sinusitis 09/11/2018  . 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 Anterior neck pain 04/10/2018  . Multiple thyroid nodules 12/03/2017  . Anxiety 09/26/2017  . Liver hemangioma 12/05/2016  . Renal cyst, right 12/05/2016  . 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 Pes anserine bursitis 04/21/2014  . 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 Osteopenia 08/04/2011  . Vitamin D deficiency 08/04/2011  . History of mitral valve prolapse 07/26/2010  . 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 Raynaud's syndrome 01/05/2010  . Hyperlipidemia 08/17/2009  . POLYARTHRITIS 08/17/2009    Past Medical History:  Diagnosis Date  . Cardiospasm 1983   felt may be related to MVP  . Hyperlipidemia   . Hypertension   . Kidney cysts 11/2015   cyst on one of her kidneys, no treatment required  . Mitral valve prolapse   . PONV (postoperative nausea and vomiting)    teeth chipped  . Raynaud disease   . Retinal hole of right eye   . Shingles 2012   leg  . Tachycardia     Family History  Problem Relation Age of Onset  . Heart attack Father 74  . Myasthenia gravis Father   . Hypertension Father   .  Hyperlipidemia Father   . Osteoporosis Father        due to steroids for Myasthenia Grvis  . Leukemia Mother 14  . Hyperlipidemia Mother   . Hypertension Mother   . Hypothyroidism Mother   . Prostate cancer Brother        in 14s  . Hyperlipidemia Brother   . Stroke Maternal Grandfather        in 32s  .  Healthy Son   . Healthy Daughter   . Diabetes Neg Hx    Past Surgical History:  Procedure Laterality Date  . COLONOSCOPY  2007   [place], [place] ,[name]  . DILATATION & CURETTAGE/HYSTEROSCOPY WITH MYOSURE N/A 08/29/2016   Procedure: DILATATION & CURETTAGE/HYSTEROSCOPY;  Surgeon: Salvadore Dom, MD;  Location: [facility];  Service: Gynecology;  Laterality: N/A;  . DILATION AND CURETTAGE OF UTERUS    . EXCISION OF SKIN TAG Left 08/29/2016   Procedure: EXCISION OF SKIN TAG left labia majora;  Surgeon: Salvadore Dom, MD;  Location: [facility];  Service: Gynecology;  Laterality: Left;  . EXPLORATORY LAPAROTOMY     X2 for pain: fibroid, enlarged oviduct  . RETINAL LASER PROCEDURE Right 08/2015  . SALPINGECTOMY  2007 or 2008   and fibroid removed  . TUBAL LIGATION     Social History   Social History Narrative   Exercises regularly   Immunization History  Administered Date(s) Administered  . Tdap 04/03/2011     Objective: Vital Signs: BP 136/69 (BP Location: Left Arm, Patient Position: Sitting, Cuff Size: Normal)   Pulse 63   Resp 13   Ht 5' 5.5" (1.664 m)   Wt 146 lb 12.8 oz (66.6 kg)   LMP 06/17/2009 (Approximate)   BMI 24.06 kg/m    Physical Exam Vitals signs and nursing note reviewed.  Constitutional:      Appearance: She is well-developed.  HENT:     Head: Normocephalic and atraumatic.  Eyes:     Conjunctiva/sclera: Conjunctivae normal.  Neck:     Musculoskeletal: Normal range of motion.  Cardiovascular:     Rate and Rhythm: Normal rate and regular rhythm.     Heart sounds: Normal heart sounds.  Pulmonary:     Effort: Pulmonary effort is normal.     Breath sounds: Normal breath sounds.  Abdominal:     General: Bowel sounds are normal.     Palpations: Abdomen is soft.  Lymphadenopathy:     Cervical: No cervical adenopathy.  Skin:    General: Skin is warm and dry.     Capillary Refill: Capillary refill takes less than 2 seconds.  Neurological:     Mental  Status: She is alert and oriented to person, place, and time.  Psychiatric:        Behavior: Behavior normal.      Musculoskeletal Exam: C-spine thoracic and lumbar spine with good range of motion.  She has some discomfort with range of motion of her lumbar spine.  Shoulder joints elbow joints wrist joints with good range of motion.  She has thickening of her right third MCP joint.  PIP and DIP thickening was noted with no synovitis.  Hip joints, knee joints, ankles, MTPs and PIPs with good range of motion with no synovitis.  CDAI Exam: CDAI Score: - Patient Global: -; Provider Global: - Swollen: -; Tender: - Joint Exam   No joint exam has been documented for this visit   There is currently no information documented on the homunculus. Go to  the Rheumatology activity and complete the homunculus joint exam.  Investigation: No additional findings.  Imaging: No results found.  Recent Labs: Lab Results  Component Value Date   WBC 5.8 07/25/2019   HGB 14.4 07/25/2019   PLT 175 07/25/2019   NA 142 07/25/2019   K 4.1 07/25/2019   CL 103 07/25/2019   CO2 25 07/25/2019   GLUCOSE 95 07/25/2019   BUN 14 07/25/2019   CREATININE 0.75 07/25/2019   BILITOT 0.7 07/25/2019   ALKPHOS 81 01/22/2019   AST 17 07/25/2019   ALT 19 07/25/2019   PROT 7.1 07/25/2019   PROT 7.3 07/25/2019   ALBUMIN 4.7 01/22/2019   CALCIUM 10.0 07/25/2019   GFRAA 98 07/25/2019  July 25, 2019 UA negative, SPEP normal, immunoglobulins normal, hepatitis B-, hepatitis C negative, HIV negative, CK 45, TSH normal, T PMT normal, vitamin D 50, G6PD normal ANCA negative, ENA negative, beta-2 negative, anticardiolipin negative, lupus anticoagulant not performed, C3-C4 normal, anti-CCP negative, _0 eta negative, ACE 46, HLA-B27 negative, uric acid 2.8  Speciality Comments: No specialty comments available.  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  She has right third MCP thickening which could be due to some underlying inflammatory process.  We had detailed discussion regarding it.  I will schedule ultrasound to evaluate this further if she has inflammatory change we may consider cortisone injection.  All autoimmune work-up is negative.  We had detailed discussion regarding the autoimmune labs.  Patient also describes possible mucinous cyst in the past.  Natural anti-inflammatories and use of diclofenac gel was discussed.  Raynaud's disease without gangrene-she gives history of rainouts phenomenon.  All autoimmune work-up was negative.  She has good capillary refill.  Trochanteric bursitis, left hip-she continues to have some left trochanteric bursitis.  IT band exercises were discussed.  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  Osteopenia, unspecified location-she is on calcium and vitamin D.  Vitamin D deficiency-she is on vitamin D supplement.  Other medical problems are listed as follows:  Retinal hole of right eye  Renal cyst, right  Essential hypertension  Liver hemangioma  Multiple thyroid nodules  History of mitral valve prolapse  History of hyperlipidemia  History of anxiety  Orders: No orders of the defined types were placed in this encounter.  No orders of the defined types were placed in this encounter.   Face-to-face time spent with patient was 30 minutes. Greater than 50% of time was spent in counseling and coordination of care.  Follow-Up Instructions: Return in about 6 months (around 02/24/2020) for Osteoarthritis.   Bo Merino, MD  Note - This record has been created using Editor, commissioning.  Chart creation errors have been sought, but may not always  have been located. Such creation errors do not reflect on  the standard of  medical care.

## 2019-08-20 ENCOUNTER — Other Ambulatory Visit: Payer: BLUE CROSS/BLUE SHIELD

## 2019-08-27 ENCOUNTER — Encounter: Payer: Self-pay | Admitting: Rheumatology

## 2019-08-27 ENCOUNTER — Other Ambulatory Visit: Payer: Self-pay

## 2019-08-27 ENCOUNTER — Ambulatory Visit (INDEPENDENT_AMBULATORY_CARE_PROVIDER_SITE_OTHER): Payer: BC Managed Care – PPO | Admitting: Rheumatology

## 2019-08-27 VITALS — BP 136/69 | HR 63 | Resp 13 | Ht 65.5 in | Wt 146.8 lb

## 2019-08-27 DIAGNOSIS — M7062 Trochanteric bursitis, left hip: Secondary | ICD-10-CM | POA: Diagnosis not present

## 2019-08-27 DIAGNOSIS — D1803 Hemangioma of intra-abdominal structures: Secondary | ICD-10-CM

## 2019-08-27 DIAGNOSIS — M1990 Unspecified osteoarthritis, unspecified site: Secondary | ICD-10-CM | POA: Diagnosis not present

## 2019-08-27 DIAGNOSIS — Z8639 Personal history of other endocrine, nutritional and metabolic disease: Secondary | ICD-10-CM

## 2019-08-27 DIAGNOSIS — E042 Nontoxic multinodular goiter: Secondary | ICD-10-CM

## 2019-08-27 DIAGNOSIS — M533 Sacrococcygeal disorders, not elsewhere classified: Secondary | ICD-10-CM | POA: Diagnosis not present

## 2019-08-27 DIAGNOSIS — I7300 Raynaud's syndrome without gangrene: Secondary | ICD-10-CM

## 2019-08-27 DIAGNOSIS — Z8679 Personal history of other diseases of the circulatory system: Secondary | ICD-10-CM

## 2019-08-27 DIAGNOSIS — Z8659 Personal history of other mental and behavioral disorders: Secondary | ICD-10-CM

## 2019-08-27 DIAGNOSIS — H33321 Round hole, right eye: Secondary | ICD-10-CM

## 2019-08-27 DIAGNOSIS — I10 Essential (primary) hypertension: Secondary | ICD-10-CM

## 2019-08-27 DIAGNOSIS — E559 Vitamin D deficiency, unspecified: Secondary | ICD-10-CM

## 2019-08-27 DIAGNOSIS — N281 Cyst of kidney, acquired: Secondary | ICD-10-CM

## 2019-08-27 DIAGNOSIS — M8580 Other specified disorders of bone density and structure, unspecified site: Secondary | ICD-10-CM

## 2019-08-27 NOTE — Progress Notes (Addendum)
Pharmacy Note  Subjective:  Patient presents today to the [facility] to see Dr. Estanislado Pandy.   Patient seen by the pharmacist for counseling on natural anti-inflammatories and Voltaren gel for osteoarthritis.  Objective: Current Outpatient Medications on File Prior to Visit  Medication Sig Dispense Refill  . ALPRAZolam (XANAX) 0.5 MG tablet TAKE ONE TABLET BY MOUTH EVERY 8 HOURS AS NEEDED FOR ANXIETY 15 tablet 0  . Cholecalciferol (VITAMIN D) 2000 units CAPS Take 2,000 Units by mouth daily.    Marland Kitchen diltiazem (CARDIZEM) 60 MG tablet TAKE ONE-HALF TABLET BY MOUTH TWO TIMES A DAY. TAKES 30 MG TWO TIMES A DAY 90 tablet 2  . ezetimibe (ZETIA) 10 MG tablet Take 1 tablet (10 mg total) by mouth daily. 90 tablet 3  . Loteprednol Etabonate (LOTEMAX OP) Apply to eye as needed.     . metoprolol succinate (TOPROL-XL) 25 MG 24 hr tablet TAKE ONE TABLET BY MOUTH TWICE A DAY 180 tablet 1  . NON FORMULARY Take 2 capsules by mouth as needed. Hydro-eye    . rosuvastatin (CRESTOR) 5 MG tablet Take 1 tablet (5 mg total) by mouth daily. 90 tablet 3   No current facility-administered medications on file prior to visit.      Assessment/Plan:  Counseled on the purpose, proper use, and adverse effects of natural anti-inflammatories including upset stomach and increased bleeding risk.  Encouraged patient to add one medication at a time and to include on medication list to monitor for adverse effects and drug interactions.  Given educational handout with recommended doses.  Patient on the purpose, proper use, and adverse effects of Voltaren gel including headache, increased blood pressure, and risk of GI bleed.  Instructed patient to avoid applying to open skin wound, or on areas of infection, rash, burn, or peeling skin.  Advised  patient wait at least 10 minutes before dressing or wearing gloves and wait at least 1 hour before you bathe or shower.  Counseled patient to wash hands after application and avoid  contact with face/eyes.  Advised patient to apply with q-tip if applying to hands to minimize absorption on palms.  It is also  available over the counter.  All questions encouraged and answered.  Instructed patient to call with any other questions or concerns.   Mariella Saa, PharmD, [place], [place] Clinical Specialty Pharmacist [phone]  08/27/2019 3:41 PM

## 2019-09-19 ENCOUNTER — Other Ambulatory Visit: Payer: Self-pay | Admitting: Internal Medicine

## 2019-09-19 NOTE — Telephone Encounter (Signed)
[place] Controlled Database Checked Last filled: 01/22/19 # 15 LOV w/you: 02/24/19 Next appt w/you: None

## 2019-09-24 ENCOUNTER — Other Ambulatory Visit: Payer: BC Managed Care – PPO

## 2019-09-26 ENCOUNTER — Encounter

## 2019-09-26 ENCOUNTER — Ambulatory Visit: Payer: BLUE CROSS/BLUE SHIELD | Admitting: Obstetrics & Gynecology

## 2019-10-01 ENCOUNTER — Encounter: Payer: Self-pay | Admitting: Internal Medicine

## 2019-10-01 ENCOUNTER — Ambulatory Visit
Admission: RE | Admit: 2019-10-01 | Discharge: 2019-10-01 | Disposition: A | Discharge status: Home / Self Care | Payer: BC Managed Care – PPO | Source: Ambulatory Visit | Attending: Internal Medicine | Admitting: Internal Medicine

## 2019-10-01 DIAGNOSIS — E042 Nontoxic multinodular goiter: Secondary | ICD-10-CM

## 2019-10-01 DIAGNOSIS — E041 Nontoxic single thyroid nodule: Secondary | ICD-10-CM | POA: Diagnosis not present

## 2019-10-07 ENCOUNTER — Other Ambulatory Visit: Payer: BC Managed Care – PPO

## 2019-10-07 ENCOUNTER — Other Ambulatory Visit: Payer: Self-pay

## 2019-10-07 DIAGNOSIS — E782 Mixed hyperlipidemia: Secondary | ICD-10-CM | POA: Diagnosis not present

## 2019-10-07 LAB — ALT: ALT: 19 IU/L (ref 0–32)

## 2019-10-07 LAB — LIPID PANEL WITH LDL/HDL RATIO
Cholesterol, Total: 157 mg/dL (ref 100–199)
HDL: 72 mg/dL (ref >39)
LDL Chol Calc (NIH): 71 mg/dL (ref 0–99)
LDL/HDL Ratio: 1 ratio (ref 0.0–3.2)
Triglycerides: 73 mg/dL (ref 0–149)
VLDL Cholesterol Cal: 14 mg/dL (ref 5–40)

## 2019-10-28 DIAGNOSIS — L853 Xerosis cutis: Secondary | ICD-10-CM | POA: Diagnosis not present

## 2019-10-28 DIAGNOSIS — L719 Rosacea, unspecified: Secondary | ICD-10-CM | POA: Diagnosis not present

## 2019-10-28 DIAGNOSIS — L570 Actinic keratosis: Secondary | ICD-10-CM | POA: Diagnosis not present

## 2019-10-28 DIAGNOSIS — L858 Other specified epidermal thickening: Secondary | ICD-10-CM | POA: Diagnosis not present

## 2019-11-06 DIAGNOSIS — H2513 Age-related nuclear cataract, bilateral: Secondary | ICD-10-CM | POA: Diagnosis not present

## 2019-11-06 DIAGNOSIS — H40023 Open angle with borderline findings, high risk, bilateral: Secondary | ICD-10-CM | POA: Diagnosis not present

## 2019-11-06 DIAGNOSIS — H35371 Puckering of macula, right eye: Secondary | ICD-10-CM | POA: Diagnosis not present

## 2019-11-06 DIAGNOSIS — H04123 Dry eye syndrome of bilateral lacrimal glands: Secondary | ICD-10-CM | POA: Diagnosis not present

## 2019-11-12 ENCOUNTER — Telehealth: Payer: Self-pay | Admitting: Internal Medicine

## 2019-11-12 NOTE — Telephone Encounter (Signed)
Amoxicillin should not be contributing to skipped beats.

## 2019-11-12 NOTE — Telephone Encounter (Signed)
Pt notified of information from pharmacist.  Pt has tried but has been unable to send apple watch reading through my chart.

## 2019-11-12 NOTE — Telephone Encounter (Signed)
Patient c/o Palpitations:  High priority if patient c/o lightheadedness, shortness of breath, or chest pain  1) How long have you had palpitations/irregular HR/ Afib? Are you having the symptoms now? Past 1 to 2 hours, Yes  2) Are you currently experiencing lightheadedness, SOB or CP? No   3) Do you have a history of afib (atrial fibrillation) or irregular heart rhythm? Yes, Irregular heart rhythm   4) Have you checked your BP or HR? (document readings if available): No  5) Are you experiencing any other symptoms? No

## 2019-11-12 NOTE — Telephone Encounter (Signed)
I spoke with pt. She has seen Dr Harrington Challenger in the past for palpitations and wore a monitor. She notes for the last 1-2 hours she has felt more palpitations then usual. Not consistently irregular but feels a skip every couple of minutes. She states Apple watch shows missed beats. She will try to send readings through my chart. Heart rate staying 70's.  I advised her to continue to monitor unless she felt as if she was going to pass out or became dizzy. I advised her to go to ED if she developed those symptoms. She drinks a half cup of coffee daily. Also just started herself on Amoxicillin for an earache. Has taken 3 doses.  Used Afrin spray yesterday but no other medications except Amoxicillin. She is asking if Amoxicillin could cause increase in skipped beats. Will check with pharmacist.

## 2019-11-14 ENCOUNTER — Telehealth: Payer: Self-pay | Admitting: Cardiology

## 2019-11-14 NOTE — Telephone Encounter (Signed)
The patient called the answering service and I called her back.  She is having more "skipped beats".  She usually has the feeling of skipped beats 4-5 times per day, isolated beats however now she is feeling it about 7 times per hour.  These are isolated and she has captured them on her apple watch that looks to her like a skipped beat.  Her description very much sounds like PVCs.  She has tried and extra half a pill of metoprolol for total of 37.5 mg  (1 dose) and she has tried Xanax which has not seemed to have much effect.  According to her apple watch her heart rate has been in range of 63-81 bpm.  Her blood pressure is 111/61.  She says that her resting heart rate at night is 50s-60s.  She denies any associated symptoms like lightheadedness, chest tightness or shortness of breath.  She denies any recent caffeine intake, chocolate, noticeable increase in stress. We discussed that these are likely PVCs and are not particularly dangerous.  We will increase her Toprol XL to 37.5 mg twice daily and she will watch to ensure that her heart rate does not go below 50.  She is unable to use diltiazem dose or frequency due to prior associated nosebleeds. -I will send this message to Dr. Harrington Challenger and triage.  If symptoms do not improve I advised the patient to call our office on Monday.

## 2019-11-15 NOTE — Telephone Encounter (Signed)
REviewed phone communications It is not abnormal to have occasional skiips like she is experiencing  Amoxiclllin is not causing this Keep on same meds   Stay active   Stay hydrated

## 2019-11-15 NOTE — Telephone Encounter (Signed)
Patient can be set up for event monitor 2 wk if higher frequency of skips persist Her last monitor in spring was normal  I am not convinced skips represent PVCs until this is demonstrated Patient is extremely anxious

## 2019-11-17 MED ORDER — METOPROLOL SUCCINATE ER 25 MG PO TB24: ORAL_TABLET | ORAL | 1 refills | Status: DC

## 2019-11-17 NOTE — Telephone Encounter (Signed)
Per patient "things have definitely improved since taking higher dose of metoprolol"   Pt taking 37.5 mg in AM and 25 mg in PM.   She and Gae Bon discussed taking 37.5 mg in PM also but there was concern about her HR being lower while she sleeps.   Adv to continue with dose she has been taking and if she starts to regularly feels skips at night to increase dose.   Her resting tends to be 50s-60s but at times she feels it faster and it per her watch it is usually in the 80s when that happens. Pt aware to stay hydrated and active and will call if she increases to metoprolol 37.5 mg BID.    Medication list updated.  Aware we would get 2 wk monitor per Dr. Harrington Challenger if freq skips would return and persist.

## 2019-11-17 NOTE — Telephone Encounter (Signed)
See telephone note dated 11/12/19.

## 2019-11-18 DIAGNOSIS — R002 Palpitations: Secondary | ICD-10-CM

## 2019-11-18 NOTE — Telephone Encounter (Signed)
Set up for a monitor please.   She will continue to have symptoms and need to define

## 2019-11-18 NOTE — Telephone Encounter (Signed)
Will forward to Dr. Harrington Challenger I encouraged patient yesterday to give the increased dose of metoprolol a trial to see if any improvement in palpitations.  She is hesitant to take the 37.5 mg at evening dose because her HR resting is 50s-60s, so has only been taking 25 mg in evening.

## 2019-11-19 ENCOUNTER — Telehealth: Payer: Self-pay | Admitting: Radiology

## 2019-11-19 NOTE — Telephone Encounter (Signed)
Enrolled patient for a 14 day Zio monitor to be mailed to patients home.  

## 2019-11-25 ENCOUNTER — Other Ambulatory Visit (INDEPENDENT_AMBULATORY_CARE_PROVIDER_SITE_OTHER): Payer: BC Managed Care – PPO

## 2019-11-25 DIAGNOSIS — R002 Palpitations: Secondary | ICD-10-CM

## 2019-12-01 ENCOUNTER — Other Ambulatory Visit: Payer: Self-pay | Admitting: Internal Medicine

## 2019-12-01 NOTE — Telephone Encounter (Signed)
[place] Controlled Database Checked Last filled: 09/21/19 # 15 LOV w/you: 02/24/19 Next appt w/you: None

## 2019-12-22 DIAGNOSIS — R002 Palpitations: Secondary | ICD-10-CM | POA: Diagnosis not present

## 2019-12-29 ENCOUNTER — Encounter: Payer: Self-pay | Admitting: Rheumatology

## 2019-12-29 ENCOUNTER — Telehealth: Payer: Self-pay | Admitting: Rheumatology

## 2019-12-29 DIAGNOSIS — M67449 Ganglion, unspecified hand: Secondary | ICD-10-CM

## 2019-12-29 NOTE — Telephone Encounter (Signed)
I returned patient's call and discussed the situation with her.  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  I would like for her to be evaluated by hand surgery.  Please refer her to hand surgery group.

## 2019-12-29 NOTE — Telephone Encounter (Signed)
Patient states she has a sore on her finger joint. Per patient, she described it to Dr. Estanislado Pandy at her last visit but it was not present at that time. Patient denies sores on the finger tips. Patient will upload a picture to MyChart for Dr. Estanislado Pandy to review.

## 2019-12-29 NOTE — Telephone Encounter (Signed)
Patient left a voicemail stating she has questions about the sores on her fingers and requesting a return call.

## 2020-01-05 DIAGNOSIS — L718 Other rosacea: Secondary | ICD-10-CM | POA: Diagnosis not present

## 2020-01-05 DIAGNOSIS — L84 Corns and callosities: Secondary | ICD-10-CM | POA: Diagnosis not present

## 2020-01-05 DIAGNOSIS — L308 Other specified dermatitis: Secondary | ICD-10-CM | POA: Diagnosis not present

## 2020-01-06 ENCOUNTER — Other Ambulatory Visit: Payer: Self-pay | Admitting: Internal Medicine

## 2020-01-07 MED ORDER — METOPROLOL SUCCINATE ER 25 MG PO TB24: ORAL_TABLET | ORAL | 2 refills | Status: DC

## 2020-01-08 ENCOUNTER — Other Ambulatory Visit: Payer: Self-pay | Admitting: Internal Medicine

## 2020-02-05 ENCOUNTER — Ambulatory Visit: Payer: BC Managed Care – PPO | Admitting: Internal Medicine

## 2020-02-07 NOTE — Progress Notes (Signed)
Cardiology Office Note   Date:  02/09/2020   ID:  Tiffany Moran, DOB [birth year], MRN [id number]  PCP:  Binnie Rail, MD  Cardiologist:   Dorris Carnes, MD   F/U of palpitations and mild CAD (by CT scan in 2016)    History of Present Illness: Tiffany Moran is a 64 y.o. female with a history of palpitations  Also a history of abnormal myovue Cardiac CT showed mild nonobstructive CAD (2016)  The pt has had event monitor in the past for palpitationns     THis showed SR with short bursts of SVT and NSVT   I last saw the pt in clinic in Aug 2020  Lipids in October 2020 LDL 71  HDL 72   Since seen the pt wore an event monitor for palpitations   This showed rare PACs, PVCs   Short burst of PAT  5 beats  SOme palpitations associated with ectopy, others not associated with arrhythmia     She says her breathing is OK   No CP  She still complains of palpitations at times   She remains very worried about spells    Worried heart will go crazy   Outpatient Medications Prior to Visit  Medication Sig Dispense Refill  . ALPRAZolam (XANAX) 0.5 MG tablet TAKE ONE TABLET BY MOUTH EVERY 8 HOURS ORN FOR ANXIETY 15 tablet 0  . Cholecalciferol (VITAMIN D) 2000 units CAPS Take 2,000 Units by mouth daily.    Marland Kitchen diltiazem (CARDIZEM) 60 MG tablet TAKE ONE-HALF TABLET BY MOUTH TWO TIMES A DAY. TAKES 30 MG TWO TIMES A DAY 90 tablet 2  . ezetimibe (ZETIA) 10 MG tablet Take 1 tablet (10 mg total) by mouth daily. 90 tablet 3  . Loteprednol Etabonate (LOTEMAX OP) Apply to eye as needed.     . metoprolol succinate (TOPROL-XL) 25 MG 24 hr tablet Take one and half tablets (37.5 mg) every AM and one tablet (25 mg) every PM 225 tablet 2  . rosuvastatin (CRESTOR) 5 MG tablet Take 1 tablet (5 mg total) by mouth daily. 90 tablet 3  . NON FORMULARY Take 2 capsules by mouth as needed. Hydro-eye     No facility-administered medications prior to visit.     Allergies:   Lipitor [atorvastatin]   Past Medical History:   Diagnosis Date  . Cardiospasm 1983   felt may be related to MVP  . Hyperlipidemia   . Hypertension   . Kidney cysts 11/2015   cyst on one of her kidneys, no treatment required  . Mitral valve prolapse   . PONV (postoperative nausea and vomiting)    teeth chipped  . Raynaud disease   . Retinal hole of right eye   . Shingles 2012   leg  . Tachycardia     Past Surgical History:  Procedure Laterality Date  . COLONOSCOPY  2007   [place], [place] ,[name]  . DILATATION & CURETTAGE/HYSTEROSCOPY WITH MYOSURE N/A 08/29/2016   Procedure: DILATATION & CURETTAGE/HYSTEROSCOPY;  Surgeon: Salvadore Dom, MD;  Location: [facility];  Service: Gynecology;  Laterality: N/A;  . DILATION AND CURETTAGE OF UTERUS    . EXCISION OF SKIN TAG Left 08/29/2016   Procedure: EXCISION OF SKIN TAG left labia majora;  Surgeon: Salvadore Dom, MD;  Location: [facility];  Service: Gynecology;  Laterality: Left;  . EXPLORATORY LAPAROTOMY     X2 for pain: fibroid, enlarged oviduct  . RETINAL LASER PROCEDURE Right 08/2015  . SALPINGECTOMY  2007 or 2008  and fibroid removed  . TUBAL LIGATION       Social History:  The patient  reports that she has never smoked. She has never used smokeless tobacco. She reports that she does not drink alcohol or use drugs.   Family History:  The patient's family history includes Healthy in her daughter and son; Heart attack (age of onset: 20) in her father; Hyperlipidemia in her brother, father, and mother; Hypertension in her father and mother; Hypothyroidism in her mother; Leukemia (age of onset: 4) in her mother; Myasthenia gravis in her father; Osteoporosis in her father; Prostate cancer in her brother; Stroke in her maternal grandfather.    ROS:  Please see the history of present illness. All other systems are reviewed and  Negative to the above problem except as noted.    PHYSICAL EXAM: VS:  BP 124/70   Pulse 64   Ht 5' 5.5" (1.664 m)   Wt 143 lb 12.8 oz (65.2 kg)    LMP 06/17/2009 (Approximate)   BMI 23.57 kg/m   GEN: Well nourished, well developed, in no acute distress  HEENT: normal  Neck: JVP is norrmal carotid bruits Cardiac: RRR; no murmurs, rubs, or gallops,no edema  Respiratory:  clear to auscultation bilaterally, normal work of breathing GI: soft, nontender, nondistended, + BS  No hepatomegaly  MS: no deformity Moving all extremities   Skin: warm and dry, Erythmatous lesions on fingers   (near knuckes) Neuro:  Strength and sensation are intact Psych: euthymic mood, full affect   EKG:  EKG is ordered today  SR 64 bpm    Lipid Panel    Component Value Date/Time   CHOL 157 10/07/2019 0738   TRIG 73 10/07/2019 0738   HDL 72 10/07/2019 0738   CHOLHDL 2.5 08/04/2019 1149   CHOLHDL 3 01/22/2019 1544   VLDL 21.2 01/22/2019 1544   LDLCALC 71 10/07/2019 0738    ECHO 02/18/19  1. The left ventricle has normal systolic function with an ejection  fraction of 60-65%. The cavity size was normal. Left ventricular diastolic  parameters were normal No evidence of left ventricular regional wall  motion abnormalities.  2. The right ventricle has normal systolic function. The cavity size was  normal. There is no increase in right ventricular wall thickness. Right  ventricular systolic pressure is normal with an estimated pressure of 29  mmHg.  3. Mild myxomatous mitral valve prolapse.  4. Mild late systolic mitral insufficiency with a centrally-directed jet.  5. The average left ventricular global longitudinal strain is -21.6 %.  6. The aortic valve is normal in structure.   CT coronary agniogram 2016  Aorta:  Normal caliber, no calcifications, no dissection.  Aortic Valve:  Trileaflet, no calcifications.  Coronary Arteries: Originating in normal position. Right dominance.  Left main is a large vessel that gives rise to LAS, ramus intermedius and LCX arteries.  LAD is a large caliber vessel that wraps around the apex and  gives rise to one diagonal branch. There is a mild calcified plaque in the proximal LAD associated with 25-50% stenosis, the rest of LAD/D1 is free of plaque.  RI is medium caliber vessel free of plaque.  LCX artery is a medium caliber non-dominant vessel that gives rise to one rather small obtuse marginal branch. There is no plaque.  RCA is a large caliber vessel that gives rise to PDA and PLA. There is minimal non-obstructive plaque.  IMPRESSION: 1. Coronary calcium score of 8. This was 72 percentile for  age and sex matched control.  2. Normal coronary origin.  Right dominance.  3. Mild non-obstructive calcified plaque in the proximal LAD. An aggressive risk factor modification is recommended.    Electronically Signed   By: Ena Dawley   On: 06/01/2015 10:50          Wt Readings from Last 3 Encounters:  02/09/20 143 lb 12.8 oz (65.2 kg)  08/27/19 146 lb 12.8 oz (66.6 kg)  08/04/19 141 lb (64 kg)      ASSESSMENT AND PLAN:  1  Palpitations Although a nuisance, I do not think they are hemodynamically destabilizing nor do they represent a signif arrhythmia   Last monitor showed rare ectopy but  She continues to have spells   I would recomm increasing metoprolol to 37.5 mg bid    Switch diltiazem to 30 mg mid day    2  CAD  Mild nonobstructive plaque on CT coronary angiogram in 2016  I am not convinced of active angina     3  HTN  Follow BP with changes in medicine dosing   F/U later this year    Signed, Dorris Carnes, MD  02/09/2020 11:56 AM    [address], [place], [place]  [address] Phone: [phone]; Fax: [phone]

## 2020-02-09 ENCOUNTER — Encounter: Payer: Self-pay | Admitting: Internal Medicine

## 2020-02-09 ENCOUNTER — Other Ambulatory Visit: Payer: Self-pay

## 2020-02-09 ENCOUNTER — Ambulatory Visit (INDEPENDENT_AMBULATORY_CARE_PROVIDER_SITE_OTHER): Payer: BC Managed Care – PPO | Admitting: Internal Medicine

## 2020-02-09 VITALS — BP 124/70 | HR 64 | Ht 65.5 in | Wt 143.8 lb

## 2020-02-09 DIAGNOSIS — R002 Palpitations: Secondary | ICD-10-CM

## 2020-02-09 MED ORDER — DILTIAZEM HCL 30 MG PO TABS: 30.0000 mg | ORAL_TABLET | Freq: Every day | ORAL | 3 refills | Status: DC

## 2020-02-09 MED ORDER — METOPROLOL SUCCINATE ER 25 MG PO TB24: 37.5000 mg | ORAL_TABLET | Freq: Two times a day (BID) | ORAL | 3 refills | Status: DC

## 2020-02-09 NOTE — Patient Instructions (Signed)
Medication Instructions:  Your physician has recommended you make the following change in your medication:  1.) change diltiazem to 30 mg daily (take mid day) 2.) increase metoprolol succinate to 37.5 mg twice a day  *If you need a refill on your cardiac medications before your next appointment, please call your pharmacy*  Lab Work: none If you have labs (blood work) drawn today and your tests are completely normal, you will receive your results only by: Marland Kitchen MyChart Message (if you have MyChart) OR . A paper copy in the mail If you have any lab test that is abnormal or we need to change your treatment, we will call you to review the results.  Testing/Procedures: none  Follow-Up: At [facility], you and your health needs are our priority.  As part of our continuing mission to provide you with exceptional heart care, we have created designated Provider Care Teams.  These Care Teams include your primary Cardiologist (physician) and Advanced Practice Providers (APPs -  Physician Assistants and Nurse Practitioners) who all work together to provide you with the care you need, when you need it.  Your next appointment:   9 month(s)  The format for your next appointment:   Either In Person or Virtual  Provider:   You may see Dorris Carnes, MD or one of the following Advanced Practice Providers on your designated Care Team:    Richardson Dopp, PA-C  [facility] [place], [place]  Daune Perch, NP   Other Instructions

## 2020-02-14 ENCOUNTER — Ambulatory Visit (HOSPITAL_COMMUNITY)
Admission: EM | Admit: 2020-02-14 | Discharge: 2020-02-14 | Disposition: A | Discharge status: Home / Self Care | Payer: BC Managed Care – PPO | Attending: Emergency Medicine | Admitting: Emergency Medicine

## 2020-02-14 ENCOUNTER — Other Ambulatory Visit: Payer: Self-pay

## 2020-02-14 ENCOUNTER — Encounter (HOSPITAL_COMMUNITY): Payer: Self-pay

## 2020-02-14 DIAGNOSIS — R112 Nausea with vomiting, unspecified: Secondary | ICD-10-CM

## 2020-02-14 DIAGNOSIS — R197 Diarrhea, unspecified: Secondary | ICD-10-CM

## 2020-02-14 MED ORDER — LOPERAMIDE HCL 2 MG PO CAPS: 2.0000 mg | ORAL_CAPSULE | Freq: Two times a day (BID) | ORAL | 0 refills | Status: DC | PRN

## 2020-02-14 MED ORDER — ONDANSETRON 4 MG PO TBDP
ORAL_TABLET | ORAL | Status: AC
  Filled 2020-02-14: qty 1

## 2020-02-14 MED ORDER — ONDANSETRON 8 MG PO TBDP: 8.0000 mg | ORAL_TABLET | Freq: Three times a day (TID) | ORAL | 0 refills | Status: DC | PRN

## 2020-02-14 MED ORDER — ONDANSETRON HCL 4 MG/2ML IJ SOLN
INTRAMUSCULAR | Status: AC
  Filled 2020-02-14: qty 2

## 2020-02-14 MED ORDER — ONDANSETRON HCL 4 MG/2ML IJ SOLN
4.0000 mg | Freq: Once | INTRAMUSCULAR | Status: AC
  Administered 2020-02-14: 4 mg via INTRAMUSCULAR

## 2020-02-14 NOTE — ED Triage Notes (Signed)
Pt reports she had her second dose of COVID vaccine yesterday and this morning woke up with diarrhea and vomiting.

## 2020-02-14 NOTE — ED Provider Notes (Signed)
[facility]   MRN: [id number] DOB: 1956-03-07  Subjective:   Tiffany Moran is a 64 y.o. female presenting for nausea, vomiting and diarrhea this morning.  Patient has had multiple episodes and feels soreness around her lower abdomen.  She just had her Covid vaccine yesterday.  This is her second dose.  She has been using Pepto Bismol but states that she is having a hard time tolerating things p.o.  Denies history of GI issues.  No current facility-administered medications for this encounter.  Current Outpatient Medications:  .  ALPRAZolam (XANAX) 0.5 MG tablet, TAKE ONE TABLET BY MOUTH EVERY 8 HOURS ORN FOR ANXIETY, Disp: 15 tablet, Rfl: 0 .  Cholecalciferol (VITAMIN D) 2000 units CAPS, Take 2,000 Units by mouth daily., Disp: , Rfl:  .  diltiazem (CARDIZEM) 30 MG tablet, Take 1 tablet (30 mg total) by mouth daily after lunch., Disp: 90 tablet, Rfl: 3 .  ezetimibe (ZETIA) 10 MG tablet, Take 1 tablet (10 mg total) by mouth daily., Disp: 90 tablet, Rfl: 3 .  Loteprednol Etabonate (LOTEMAX OP), Apply to eye as needed. , Disp: , Rfl:  .  metoprolol succinate (TOPROL XL) 25 MG 24 hr tablet, Take 1.5 tablets (37.5 mg total) by mouth 2 (two) times daily., Disp: 270 tablet, Rfl: 3 .  rosuvastatin (CRESTOR) 5 MG tablet, Take 1 tablet (5 mg total) by mouth daily., Disp: 90 tablet, Rfl: 3   Allergies  Allergen Reactions  . Lipitor [Atorvastatin] Other (See Comments)    Did not tolerate    Past Medical History:  Diagnosis Date  . Cardiospasm 1983   felt may be related to MVP  . Hyperlipidemia   . Hypertension   . Kidney cysts 11/2015   cyst on one of her kidneys, no treatment required  . Mitral valve prolapse   . PONV (postoperative nausea and vomiting)    teeth chipped  . Raynaud disease   . Retinal hole of right eye   . Shingles 2012   leg  . Tachycardia      Past Surgical History:  Procedure Laterality Date  . COLONOSCOPY  2007   [place], [place] ,[name]  . DILATATION &  CURETTAGE/HYSTEROSCOPY WITH MYOSURE N/A 08/29/2016   Procedure: DILATATION & CURETTAGE/HYSTEROSCOPY;  Surgeon: Salvadore Dom, MD;  Location: [facility];  Service: Gynecology;  Laterality: N/A;  . DILATION AND CURETTAGE OF UTERUS    . EXCISION OF SKIN TAG Left 08/29/2016   Procedure: EXCISION OF SKIN TAG left labia majora;  Surgeon: Salvadore Dom, MD;  Location: [facility];  Service: Gynecology;  Laterality: Left;  . EXPLORATORY LAPAROTOMY     X2 for pain: fibroid, enlarged oviduct  . RETINAL LASER PROCEDURE Right 08/2015  . SALPINGECTOMY  2007 or 2008   and fibroid removed  . TUBAL LIGATION      Family History  Problem Relation Age of Onset  . Heart attack Father 47  . Myasthenia gravis Father   . Hypertension Father   . Hyperlipidemia Father   . Osteoporosis Father        due to steroids for Myasthenia Grvis  . Leukemia Mother 87  . Hyperlipidemia Mother   . Hypertension Mother   . Hypothyroidism Mother   . Prostate cancer Brother        in 32s  . Hyperlipidemia Brother   . Stroke Maternal Grandfather        in 25s  . Healthy Son   . Healthy Daughter   . Diabetes  Neg Hx     Social History   Tobacco Use  . Smoking status: Never Smoker  . Smokeless tobacco: Never Used  Substance Use Topics  . Alcohol use: No  . Drug use: No    ROS   Objective:   Vitals: BP 135/60 (BP Location: Right Arm)   Pulse 92   Temp 98.9 F (37.2 C) (Oral)   Resp 17   LMP 06/17/2009 (Approximate)   SpO2 100%   Physical Exam Constitutional:      General: She is not in acute distress.    Appearance: Normal appearance. She is well-developed and normal weight. She is not ill-appearing, toxic-appearing or diaphoretic.  HENT:     Head: Normocephalic and atraumatic.     Right Ear: External ear normal.     Left Ear: External ear normal.     Nose: Nose normal.     Mouth/Throat:     Mouth: Mucous membranes are moist.     Pharynx: Oropharynx is clear.  Eyes:     General: No scleral  icterus.    Extraocular Movements: Extraocular movements intact.     Pupils: Pupils are equal, round, and reactive to light.  Cardiovascular:     Rate and Rhythm: Normal rate and regular rhythm.     Pulses: Normal pulses.     Heart sounds: Normal heart sounds. No murmur. No friction rub. No gallop.   Pulmonary:     Effort: Pulmonary effort is normal. No respiratory distress.     Breath sounds: Normal breath sounds. No stridor. No wheezing, rhonchi or rales.  Abdominal:     General: Bowel sounds are normal. There is no distension.     Palpations: Abdomen is soft. There is no mass.     Tenderness: There is generalized abdominal tenderness (Mild) and tenderness in the right lower quadrant, periumbilical area, suprapubic area and left lower quadrant. There is no right CVA tenderness, left CVA tenderness, guarding or rebound.  Skin:    General: Skin is warm and dry.     Coloration: Skin is not pale.     Findings: No rash.  Neurological:     General: No focal deficit present.     Mental Status: She is alert and oriented to person, place, and time.  Psychiatric:        Mood and Affect: Mood normal.        Behavior: Behavior normal.        Thought Content: Thought content normal.        Judgment: Judgment normal.      Assessment and Plan :   1. Nausea vomiting and diarrhea     Counseled patient that these may be systemic symptoms related to developing immunity from the vaccine.  Also discussed possibility of viral gastroenteritis.  Patient does not have alarming physical exam findings of acute abdomen.  Vital signs are stable.  She was given IM Zofran in clinic.  She is to use p.o. Zofran and Imodium outpatient. Counseled patient on potential for adverse effects with medications prescribed/recommended today, ER and return-to-clinic precautions discussed, patient verbalized understanding.    Jaynee Eagles, PA-C 02/14/20 1248

## 2020-02-14 NOTE — Discharge Instructions (Addendum)
Make sure you push fluids drinking mostly water but mix it with Gatorade.  Try to eat light meals including soups, broths and soft foods, fruits.  You may use Zofran for your nausea and vomiting once every 8 hours.  Imodium can help with diarrhea but use this carefully limiting it to 1-2 times per day only if you are having a lot of diarrhea.

## 2020-02-16 ENCOUNTER — Encounter: Payer: Self-pay | Admitting: Internal Medicine

## 2020-02-24 ENCOUNTER — Ambulatory Visit: Payer: BC Managed Care – PPO | Admitting: Physician Assistant

## 2020-03-07 NOTE — Progress Notes (Signed)
Subjective:    Patient ID: Tiffany Moran, female    DOB: December 12, 1956, 64 y.o.   MRN: [id number]  HPI She is here for a physical exam.   Scaly lesion on nose.  Left side of mouth dropping more.  Deep burping more in the past few months.  She has occasional reflux.  She does not drink anything carbonated.    Right ear intermittent ear pain.  Nose runs all the time.    Blow breaths when walking. It make her feel better.    Right knee harder to get up -- she knows she has some arthritis in it.   Had thyroid scan the end of last year. ? Need another one.  Medications and allergies reviewed with patient and updated if appropriate.  Patient Active Problem List   Diagnosis Date Noted  . 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 Hypertension 01/22/2019  . Chronic sinusitis 09/11/2018  . 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 Anterior neck pain 04/10/2018  . Multiple thyroid nodules 12/03/2017  . Anxiety 09/26/2017  . Liver hemangioma 12/05/2016  . Renal cyst, right 12/05/2016  . 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 Pes anserine bursitis 04/21/2014  . 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 Osteopenia 08/04/2011  . Vitamin D deficiency 08/04/2011  . History of mitral valve prolapse 07/26/2010  . 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 Raynaud's syndrome 01/05/2010  . Hyperlipidemia 08/17/2009  . POLYARTHRITIS 08/17/2009    Current Outpatient Medications on File Prior to Visit  Medication Sig Dispense Refill  . ALPRAZolam (XANAX) 0.5 MG tablet TAKE ONE TABLET BY MOUTH EVERY 8 HOURS ORN FOR ANXIETY 15 tablet 0  . Cholecalciferol (VITAMIN D) 2000 units CAPS Take 2,000 Units by mouth daily.    Marland Kitchen diltiazem (CARDIZEM) 30 MG tablet Take 1 tablet (30 mg total) by mouth daily after lunch. 90 tablet 3  . loperamide (IMODIUM) 2 MG capsule Take 1 capsule (2 mg total) by mouth 2 (two) times daily as needed for diarrhea or loose stools. 10 capsule 0  . Loteprednol Etabonate  (LOTEMAX OP) Apply to eye as needed.     . metoprolol succinate (TOPROL XL) 25 MG 24 hr tablet Take 1.5 tablets (37.5 mg total) by mouth 2 (two) times daily. 270 tablet 3  . ondansetron (ZOFRAN-ODT) 8 MG disintegrating tablet Take 1 tablet (8 mg total) by mouth every 8 (eight) hours as needed for nausea or vomiting. 15 tablet 0  . rosuvastatin (CRESTOR) 5 MG tablet Take 1 tablet (5 mg total) by mouth daily. 90 tablet 3  . ezetimibe (ZETIA) 10 MG tablet Take 1 tablet (10 mg total) by mouth daily. 90 tablet 3   No current facility-administered medications on file prior to visit.    Past Medical History:  Diagnosis Date  . Cardiospasm 1983   felt may be related to MVP  . Hyperlipidemia   . Hypertension   . Kidney cysts 11/2015   cyst on one of her kidneys, no treatment required  . Mitral valve prolapse   . PONV (postoperative nausea and vomiting)    teeth chipped  . Raynaud disease   . Retinal hole of right eye   . Shingles 2012   leg  . Tachycardia     Past Surgical History:  Procedure Laterality Date  . COLONOSCOPY  2007   [place], [place] ,[name]  . DILATATION & CURETTAGE/HYSTEROSCOPY WITH MYOSURE N/A 08/29/2016   Procedure: DILATATION & CURETTAGE/HYSTEROSCOPY;  Surgeon: Salvadore Dom, MD;  Location: [facility];  Service: Gynecology;  Laterality: N/A;  . DILATION AND CURETTAGE OF UTERUS    . EXCISION OF SKIN TAG Left 08/29/2016   Procedure: EXCISION OF SKIN TAG left labia majora;  Surgeon: Salvadore Dom, MD;  Location: [facility];  Service: Gynecology;  Laterality: Left;  . EXPLORATORY LAPAROTOMY     X2 for pain: fibroid, enlarged oviduct  . RETINAL LASER PROCEDURE Right 08/2015  . SALPINGECTOMY  2007 or 2008   and fibroid removed  . TUBAL LIGATION      Social History   Socioeconomic History  . Marital status: Married    Spouse name: Not on file  . Number of children: Not on file  . Years of education: Not on file  . Highest education level: Not on file    Occupational History  . Not on file  Tobacco Use  . Smoking status: Never Smoker  . Smokeless tobacco: Never Used  Substance and Sexual Activity  . Alcohol use: No  . Drug use: No  . Sexual activity: Yes    Birth control/protection: Other-see comments, Post-menopausal    Comment: BTL  Other Topics Concern  . Not on file  Social History Narrative   Exercises regularly   Social Determinants of Health   Financial Resource Strain:   . Difficulty of Paying Living Expenses:   Food Insecurity:   . Worried About Charity fundraiser in the Last Year:   . Arboriculturist in the Last Year:   Transportation Needs:   . Film/video editor (Medical):   Marland Kitchen Lack of Transportation (Non-Medical):   Physical Activity:   . Days of Exercise per Week:   . Minutes of Exercise per Session:   Stress:   . Feeling of Stress :   Social Connections:   . Frequency of Communication with Friends and Family:   . Frequency of Social Gatherings with Friends and Family:   . Attends Religious Services:   . Active Member of Clubs or Organizations:   . Attends Archivist Meetings:   Marland Kitchen Marital Status:     Family History  Problem Relation Age of Onset  . Heart attack Father 20  . Myasthenia gravis Father   . Hypertension Father   . Hyperlipidemia Father   . Osteoporosis Father        due to steroids for Myasthenia Grvis  . Leukemia Mother 69  . Hyperlipidemia Mother   . Hypertension Mother   . Hypothyroidism Mother   . Prostate cancer Brother        in 65s  . Hyperlipidemia Brother   . Stroke Maternal Grandfather        in 18s  . Healthy Son   . Healthy Daughter   . Diabetes Neg Hx     Review of Systems  Constitutional: Negative for chills and fever.  Eyes: Negative for visual disturbance.  Respiratory: Negative for cough, shortness of breath and wheezing.   Cardiovascular: Positive for chest pain (occ left sided discomfort) and palpitations. Negative for leg swelling.   Gastrointestinal: Negative for abdominal pain, blood in stool, constipation, diarrhea and nausea.       Rare gerd, increased burping  Genitourinary: Negative for dysuria and hematuria.  Musculoskeletal: Positive for arthralgias (knee, hands).  Skin: Negative for color change (dry patch on nose).  Neurological: Positive for light-headedness (with palpitations) and headaches (rare).  Psychiatric/Behavioral: Negative for dysphoric mood. The patient is nervous/anxious.  Objective:   Vitals:   03/08/20 0825  BP: 132/84  Pulse: 65  Resp: 16  Temp: 98.4 F (36.9 C)  SpO2: 99%   Filed Weights   03/08/20 0825  Weight: 142 lb 6.4 oz (64.6 kg)   Body mass index is 23.34 kg/m.  BP Readings from Last 3 Encounters:  03/08/20 132/84  02/14/20 135/60  02/09/20 124/70    Wt Readings from Last 3 Encounters:  03/08/20 142 lb 6.4 oz (64.6 kg)  02/09/20 143 lb 12.8 oz (65.2 kg)  08/27/19 146 lb 12.8 oz (66.6 kg)     Physical Exam Constitutional: She appears well-developed and well-nourished. No distress.  HENT:  Head: Normocephalic and atraumatic.  Right Ear: External ear normal. Normal ear canal and TM Left Ear: External ear normal.  Normal ear canal and TM Mouth/Throat: Oropharynx is clear and moist.  Eyes: Conjunctivae and EOM are normal.  Neck: Neck supple. No tracheal deviation present. No thyromegaly present.  No carotid bruit  Cardiovascular: Normal rate, regular rhythm and normal heart sounds.  No murmur heard.  No edema. Pulmonary/Chest: Effort normal and breath sounds normal. No respiratory distress. She has no wheezes. She has no rales.  Breast: deferred   Abdominal: Soft. She exhibits no distension. There is no tenderness.  Lymphadenopathy: She has no cervical adenopathy.  Neuro; no facial droop, CN II-XII intact Skin: Skin is warm and dry. She is not diaphoretic. Minimal scaling on nasal bridge Psychiatric: She has a normal mood and affect. Her behavior is  normal.        Assessment & Plan:   Physical exam: Screening blood work    ordered Immunizations  Up to date Colonoscopy   Up to date  Mammogram   Up to date  Gyn  Up to date  Dexa   Due this June  - ordered by [facility] exams  Up to date  Exercise  Walking daily Weight  Normal BMI Substance abuse   none Will f/u with derm regarding scaling on nose   See Problem List for Assessment and Plan of chronic medical problems.     This visit occurred during the SARS-CoV-2 public health emergency.  Safety protocols were in place, including screening questions prior to the visit, additional usage of staff PPE, and extensive cleaning of exam room while observing appropriate contact time as indicated for disinfecting solutions.

## 2020-03-07 NOTE — Patient Instructions (Addendum)
Blood work was ordered.    All other Health Maintenance issues reviewed.   All recommended immunizations and age-appropriate screenings are up-to-date or discussed.  No immunization administered today.   Medications reviewed and updated.  Changes include :   Phenergan as needed  Your prescription(s) have been submitted to your pharmacy. Please take as directed and contact our office if you believe you are having problem(s) with the medication(s).   Please followup in 1 year    Health Maintenance, Female Adopting a healthy lifestyle and getting preventive care are important in promoting health and wellness. Ask your health care provider about:  The right schedule for you to have regular tests and exams.  Things you can do on your own to prevent diseases and keep yourself healthy. What should I know about diet, weight, and exercise? Eat a healthy diet   Eat a diet that includes plenty of vegetables, fruits, low-fat dairy products, and lean protein.  Do not eat a lot of foods that are high in solid fats, added sugars, or sodium. Maintain a healthy weight Body mass index (BMI) is used to identify weight problems. It estimates body fat based on height and weight. Your health care provider can help determine your BMI and help you achieve or maintain a healthy weight. Get regular exercise Get regular exercise. This is one of the most important things you can do for your health. Most adults should:  Exercise for at least 150 minutes each week. The exercise should increase your heart rate and make you sweat (moderate-intensity exercise).  Do strengthening exercises at least twice a week. This is in addition to the moderate-intensity exercise.  Spend less time sitting. Even light physical activity can be beneficial. Watch cholesterol and blood lipids Have your blood tested for lipids and cholesterol at 65 years of age, then have this test every 5 years. Have your cholesterol levels  checked more often if:  Your lipid or cholesterol levels are high.  You are older than 65 years of age.  You are at high risk for heart disease. What should I know about cancer screening? Depending on your health history and family history, you may need to have cancer screening at various ages. This may include screening for:  Breast cancer.  Cervical cancer.  Colorectal cancer.  Skin cancer.  Lung cancer. What should I know about heart disease, diabetes, and high blood pressure? Blood pressure and heart disease  High blood pressure causes heart disease and increases the risk of stroke. This is more likely to develop in people who have high blood pressure readings, are of African descent, or are overweight.  Have your blood pressure checked: ? Every 3-5 years if you are 15-34 years of age. ? Every year if you are 65 years old or older. Diabetes Have regular diabetes screenings. This checks your fasting blood sugar level. Have the screening done:  Once every three years after age 80 if you are at a normal weight and have a low risk for diabetes.  More often and at a younger age if you are overweight or have a high risk for diabetes. What should I know about preventing infection? Hepatitis B If you have a higher risk for hepatitis B, you should be screened for this virus. Talk with your health care provider to find out if you are at risk for hepatitis B infection. Hepatitis C Testing is recommended for:  Everyone born from [birth year] through 1966.  Anyone with known risk factors for  hepatitis C. Sexually transmitted infections (STIs)  Get screened for STIs, including gonorrhea and chlamydia, if: ? You are sexually active and are younger than 65 years of age. ? You are older than 65 years of age and your health care provider tells you that you are at risk for this type of infection. ? Your sexual activity has changed since you were last screened, and you are at increased risk  for chlamydia or gonorrhea. Ask your health care provider if you are at risk.  Ask your health care provider about whether you are at high risk for HIV. Your health care provider may recommend a prescription medicine to help prevent HIV infection. If you choose to take medicine to prevent HIV, you should first get tested for HIV. You should then be tested every 3 months for as long as you are taking the medicine. Pregnancy  If you are about to stop having your period (premenopausal) and you may become pregnant, seek counseling before you get pregnant.  Take 400 to 800 micrograms (mcg) of folic acid every day if you become pregnant.  Ask for birth control (contraception) if you want to prevent pregnancy. Osteoporosis and menopause Osteoporosis is a disease in which the bones lose minerals and strength with aging. This can result in bone fractures. If you are 46 years old or older, or if you are at risk for osteoporosis and fractures, ask your health care provider if you should:  Be screened for bone loss.  Take a calcium or vitamin D supplement to lower your risk of fractures.  Be given hormone replacement therapy (HRT) to treat symptoms of menopause. Follow these instructions at home: Lifestyle  Do not use any products that contain nicotine or tobacco, such as cigarettes, e-cigarettes, and chewing tobacco. If you need help quitting, ask your health care provider.  Do not use street drugs.  Do not share needles.  Ask your health care provider for help if you need support or information about quitting drugs. Alcohol use  Do not drink alcohol if: ? Your health care provider tells you not to drink. ? You are pregnant, may be pregnant, or are planning to become pregnant.  If you drink alcohol: ? Limit how much you use to 0-1 drink a day. ? Limit intake if you are breastfeeding.  Be aware of how much alcohol is in your drink. In the [place] one drink equals one 12 oz bottle of beer (355  mL), one 5 oz glass of wine (148 mL), or one 1 oz glass of hard liquor (44 mL). General instructions  Schedule regular health, dental, and eye exams.  Stay current with your vaccines.  Tell your health care provider if: ? You often feel depressed. ? You have ever been abused or do not feel safe at home. Summary  Adopting a healthy lifestyle and getting preventive care are important in promoting health and wellness.  Follow your health care provider's instructions about healthy diet, exercising, and getting tested or screened for diseases.  Follow your health care provider's instructions on monitoring your cholesterol and blood pressure. This information is not intended to replace advice given to you by your health care provider. Make sure you discuss any questions you have with your health care provider. Document Revised: 11/27/2018 Document Reviewed: 11/27/2018 Elsevier Patient Education  2020 [employer].

## 2020-03-08 ENCOUNTER — Encounter: Payer: Self-pay | Admitting: Internal Medicine

## 2020-03-08 ENCOUNTER — Ambulatory Visit (INDEPENDENT_AMBULATORY_CARE_PROVIDER_SITE_OTHER): Payer: BC Managed Care – PPO | Admitting: Internal Medicine

## 2020-03-08 ENCOUNTER — Other Ambulatory Visit: Payer: Self-pay

## 2020-03-08 VITALS — BP 132/84 | HR 65 | Temp 98.4°F | Resp 16 | Ht 65.5 in | Wt 142.4 lb

## 2020-03-08 DIAGNOSIS — I471 Supraventricular tachycardia: Secondary | ICD-10-CM

## 2020-03-08 DIAGNOSIS — M1711 Unilateral primary osteoarthritis, right knee: Secondary | ICD-10-CM

## 2020-03-08 DIAGNOSIS — Z0000 Encounter for general adult medical examination without abnormal findings: Secondary | ICD-10-CM | POA: Diagnosis not present

## 2020-03-08 DIAGNOSIS — I10 Essential (primary) hypertension: Secondary | ICD-10-CM | POA: Diagnosis not present

## 2020-03-08 DIAGNOSIS — F419 Anxiety disorder, unspecified: Secondary | ICD-10-CM

## 2020-03-08 DIAGNOSIS — M8580 Other specified disorders of bone density and structure, unspecified site: Secondary | ICD-10-CM | POA: Diagnosis not present

## 2020-03-08 DIAGNOSIS — E782 Mixed hyperlipidemia: Secondary | ICD-10-CM

## 2020-03-08 LAB — CBC WITH DIFFERENTIAL/PLATELET
Basophils Absolute: 0 10*3/uL (ref 0.0–0.1)
Basophils Relative: 0.2 % (ref 0.0–3.0)
Eosinophils Absolute: 0.1 10*3/uL (ref 0.0–0.7)
Eosinophils Relative: 1.3 % (ref 0.0–5.0)
HCT: 41.2 % (ref 36.0–46.0)
Hemoglobin: 14 g/dL (ref 12.0–15.0)
Lymphocytes Relative: 24.7 % (ref 12.0–46.0)
Lymphs Abs: 1.5 10*3/uL (ref 0.7–4.0)
MCHC: 33.9 g/dL (ref 30.0–36.0)
MCV: 90.3 fl (ref 78.0–100.0)
Monocytes Absolute: 0.4 10*3/uL (ref 0.1–1.0)
Monocytes Relative: 6.2 % (ref 3.0–12.0)
Neutro Abs: 4.2 10*3/uL (ref 1.4–7.7)
Neutrophils Relative %: 67.6 % (ref 43.0–77.0)
Platelets: 156 10*3/uL (ref 150.0–400.0)
RBC: 4.57 Mil/uL (ref 3.87–5.11)
RDW: 13.4 % (ref 11.5–15.5)
WBC: 6.2 10*3/uL (ref 4.0–10.5)

## 2020-03-08 LAB — COMPREHENSIVE METABOLIC PANEL
ALT: 30 U/L (ref 0–35)
AST: 22 U/L (ref 0–37)
Albumin: 4.3 g/dL (ref 3.5–5.2)
Alkaline Phosphatase: 85 U/L (ref 39–117)
BUN: 13 mg/dL (ref 6–23)
CO2: 29 mEq/L (ref 19–32)
Calcium: 9.7 mg/dL (ref 8.4–10.5)
Chloride: 104 mEq/L (ref 96–112)
Creatinine, Ser: 0.82 mg/dL (ref 0.40–1.20)
GFR: 70.1 mL/min (ref >60.00)
Glucose, Bld: 96 mg/dL (ref 70–99)
Potassium: 4.4 mEq/L (ref 3.5–5.1)
Sodium: 140 mEq/L (ref 135–145)
Total Bilirubin: 1 mg/dL (ref 0.2–1.2)
Total Protein: 7.1 g/dL (ref 6.0–8.3)

## 2020-03-08 LAB — LIPID PANEL
Cholesterol: 169 mg/dL (ref 0–200)
HDL: 71.6 mg/dL (ref >39.00)
LDL Cholesterol: 80 mg/dL (ref 0–99)
NonHDL: 97.25
Total CHOL/HDL Ratio: 2
Triglycerides: 87 mg/dL (ref 0.0–149.0)
VLDL: 17.4 mg/dL (ref 0.0–40.0)

## 2020-03-08 LAB — TSH: TSH: 1.74 u[IU]/mL (ref 0.35–4.50)

## 2020-03-08 MED ORDER — PROMETHAZINE HCL 25 MG RE SUPP: 25.0000 mg | Freq: Four times a day (QID) | RECTAL | 1 refills | Status: DC | PRN

## 2020-03-08 NOTE — Assessment & Plan Note (Signed)
Chronic BP well controlled Current regimen effective and well tolerated Continue current medications at current doses cmp  

## 2020-03-08 NOTE — Assessment & Plan Note (Addendum)
Chronic managed by Dr Harrington Challenger Taking crestor 5 mg  Lipids well controlled, recheck today

## 2020-03-08 NOTE — Assessment & Plan Note (Signed)
dexa due this summer - ordered by gyn Walking daily Taking vitamin d

## 2020-03-08 NOTE — Assessment & Plan Note (Signed)
Acute Having pain in right knee on medial aspect Discussed conservative treatment discussed Continue regular exercise Can refer to sports med or ortho when she is ready for further evaluation

## 2020-03-08 NOTE — Assessment & Plan Note (Addendum)
Saw Dr Harrington Challenger Taking metoprolol, cardizem

## 2020-03-08 NOTE — Assessment & Plan Note (Signed)
Chronic, intermittent Taking xanax prn - tries not to take it Can try lavender pills Has tried cbd  Declines daily SSRI

## 2020-03-26 ENCOUNTER — Encounter: Payer: Self-pay | Admitting: Internal Medicine

## 2020-04-05 ENCOUNTER — Other Ambulatory Visit: Payer: Self-pay | Admitting: Internal Medicine

## 2020-04-06 NOTE — Telephone Encounter (Signed)
Last OV 03/08/20 Last RF 12/02/19

## 2020-04-12 DIAGNOSIS — L718 Other rosacea: Secondary | ICD-10-CM | POA: Diagnosis not present

## 2020-04-12 DIAGNOSIS — D2372 Other benign neoplasm of skin of left lower limb, including hip: Secondary | ICD-10-CM | POA: Diagnosis not present

## 2020-04-12 DIAGNOSIS — L84 Corns and callosities: Secondary | ICD-10-CM | POA: Diagnosis not present

## 2020-04-12 DIAGNOSIS — L74510 Primary focal hyperhidrosis, axilla: Secondary | ICD-10-CM | POA: Diagnosis not present

## 2020-04-17 ENCOUNTER — Other Ambulatory Visit: Payer: Self-pay | Admitting: Internal Medicine

## 2020-04-20 MED ORDER — DILTIAZEM HCL 30 MG PO TABS: 30.0000 mg | ORAL_TABLET | Freq: Every day | ORAL | 3 refills | Status: DC

## 2020-05-05 ENCOUNTER — Other Ambulatory Visit: Payer: Self-pay | Admitting: Internal Medicine

## 2020-05-12 DIAGNOSIS — H35371 Puckering of macula, right eye: Secondary | ICD-10-CM | POA: Diagnosis not present

## 2020-05-12 DIAGNOSIS — H04123 Dry eye syndrome of bilateral lacrimal glands: Secondary | ICD-10-CM | POA: Diagnosis not present

## 2020-05-12 DIAGNOSIS — H40023 Open angle with borderline findings, high risk, bilateral: Secondary | ICD-10-CM | POA: Diagnosis not present

## 2020-05-12 DIAGNOSIS — H2513 Age-related nuclear cataract, bilateral: Secondary | ICD-10-CM | POA: Diagnosis not present

## 2020-06-13 ENCOUNTER — Other Ambulatory Visit: Payer: Self-pay | Admitting: Internal Medicine

## 2020-06-23 DIAGNOSIS — Z1231 Encounter for screening mammogram for malignant neoplasm of breast: Secondary | ICD-10-CM | POA: Diagnosis not present

## 2020-06-24 ENCOUNTER — Telehealth: Payer: Self-pay | Admitting: *Deleted

## 2020-06-24 NOTE — Telephone Encounter (Signed)
Patient called requesting assistance with the billing department at [facility] regarding her ZIO XT patch monitor which was worn 11/25/2019 thru 12/09/2019.  Patient returned monitor and called Katy at [facility] to confirm monitor was received by [facility] on 12/15/2019 and would be processed and covered 100% by her insurance company. [employer] did not complete processing of monitor until 12/22/2019 and billed her insurance company for service in 2021.  Her monitor was not covered because she had a new deductible to meet in 2021.  Patient states her service was 11/25/2019 through 12/09/2019 and we agree.  Patients day of service would be represented by the day the monitor was started. I informed patient I would speak to our [employer] representative, Cathey Endow to see if this billing error could be resolved.   Ailene Ravel was called 06/24/2020, 2:11PM.

## 2020-07-19 ENCOUNTER — Encounter: Payer: Self-pay | Admitting: Obstetrics & Gynecology

## 2020-07-28 ENCOUNTER — Other Ambulatory Visit: Payer: Self-pay | Admitting: Internal Medicine

## 2020-08-16 NOTE — Progress Notes (Signed)
64 y.o. G57P2002 Married White or Caucasian female here for annual exam.  Denies vaginal bleeding.    Having some palpitation issues that are under better control with medications.  Uses Xanax if feeling anxiety with any palpitations.  Cardiac evaluation has been normal.    Having more anxiety just in general.    Has precancerous place on her nose.  Treated it with cream.  Not sure this has cleared it up.  Having vaginal dryness and painful intercourse.  Stopped estrogen a few years ago due to concerns about upcoming cardiology appt.    Patient's last menstrual period was 06/17/2009 (approximate).          Sexually active: Yes.    The current method of family planning is post menopausal status & BTL    Exercising: Yes.    walk 1-2 miles a day Smoker:  no  Health Maintenance: Pap:  06-04-2017 neg HPV HR neg, 06-13-18 neg, 06-16-2019 neg History of abnormal Pap:  yes MMG:  06-23-2020 category c density birads 1:neg Colonoscopy:  2016 normal f/u 11yrs BMD:   06-15-17 osteopenia TDaP:  2021 Pneumonia vaccine(s):  Not done Shingrix:   Not done Hep C testing: neg 2020 Screening Labs: poct urine-neg.  Lab work done 02/2020.   reports that she has never smoked. She has never used smokeless tobacco. She reports that she does not drink alcohol and does not use drugs.  Past Medical History:  Diagnosis Date  . Cardiospasm 1983   felt may be related to MVP  . Hyperlipidemia   . Hypertension   . Kidney cysts 11/2015   cyst on one of her kidneys, no treatment required  . Mitral valve prolapse   . PONV (postoperative nausea and vomiting)    teeth chipped  . Raynaud disease   . Retinal hole of right eye   . Shingles 2012   leg  . Tachycardia     Past Surgical History:  Procedure Laterality Date  . COLONOSCOPY  2007   [place], [place] ,[name]  . DILATATION & CURETTAGE/HYSTEROSCOPY WITH MYOSURE N/A 08/29/2016   Procedure: DILATATION & CURETTAGE/HYSTEROSCOPY;  Surgeon: Salvadore Dom, MD;   Location: [facility];  Service: Gynecology;  Laterality: N/A;  . DILATION AND CURETTAGE OF UTERUS    . EXCISION OF SKIN TAG Left 08/29/2016   Procedure: EXCISION OF SKIN TAG left labia majora;  Surgeon: Salvadore Dom, MD;  Location: [facility];  Service: Gynecology;  Laterality: Left;  . EXPLORATORY LAPAROTOMY     X2 for pain: fibroid, enlarged oviduct  . RETINAL LASER PROCEDURE Right 08/2015  . SALPINGECTOMY  2007 or 2008   and fibroid removed  . TUBAL LIGATION      Current Outpatient Medications  Medication Sig Dispense Refill  . ALPRAZolam (XANAX) 0.5 MG tablet TAKE ONE TABLET BY MOUTH EVERY 8 HOURS AS NEEDED FOR ANXIETY 15 tablet 0  . Cholecalciferol (VITAMIN D) 2000 units CAPS Take 2,000 Units by mouth daily.    Marland Kitchen diltiazem (CARDIZEM) 30 MG tablet Take 1 tablet (30 mg total) by mouth daily after lunch. 90 tablet 3  . ezetimibe (ZETIA) 10 MG tablet TAKE 1 TABLET BY MOUTH DAILY 90 tablet 3  . loperamide (IMODIUM) 2 MG capsule Take 1 capsule (2 mg total) by mouth 2 (two) times daily as needed for diarrhea or loose stools. 10 capsule 0  . Loteprednol Etabonate (LOTEMAX OP) Apply to eye as needed.     . metoprolol succinate (TOPROL XL) 25 MG 24  hr tablet Take 1.5 tablets (37.5 mg total) by mouth 2 (two) times daily. 270 tablet 3  . promethazine (PHENERGAN) 25 MG suppository Place 1 suppository (25 mg total) rectally every 6 (six) hours as needed for nausea or vomiting. 5 each 1  . rosuvastatin (CRESTOR) 5 MG tablet TAKE ONE TABLET BY MOUTH DAILY 90 tablet 2   No current facility-administered medications for this visit.    Family History  Problem Relation Age of Onset  . Heart attack Father 41  . Myasthenia gravis Father   . Hypertension Father   . Hyperlipidemia Father   . Osteoporosis Father        due to steroids for Myasthenia Grvis  . Leukemia Mother 58  . Hyperlipidemia Mother   . Hypertension Mother   . Hypothyroidism Mother   . Prostate cancer Brother        in 1s  .  Hyperlipidemia Brother   . Stroke Maternal Grandfather        in 57s  . Healthy Son   . Healthy Daughter   . Diabetes Neg Hx     Review of Systems  Constitutional: Negative.   HENT: Negative.   Eyes: Negative.   Respiratory: Negative.   Cardiovascular: Negative.   Gastrointestinal: Negative.   Endocrine: Negative.   Genitourinary: Negative.   Musculoskeletal: Negative.   Skin: Negative.   Allergic/Immunologic: Negative.   Neurological: Negative.   Hematological: Negative.   Psychiatric/Behavioral: Negative.     Exam:   BP 120/78   Pulse 68   Resp 16   Ht 5' 5.5" (1.664 m)   Wt 138 lb (62.6 kg)   LMP 06/17/2009 (Approximate)   BMI 22.62 kg/m   Height: 5' 5.5" (166.4 cm)  General appearance: alert, cooperative and appears stated age Head: Normocephalic, without obvious abnormality, atraumatic Neck: no adenopathy, supple, symmetrical, trachea midline and thyroid normal to inspection and palpation Lungs: clear to auscultation bilaterally Breasts: normal appearance, no masses or tenderness Heart: regular rate and rhythm Abdomen: soft, non-tender; bowel sounds normal; no masses,  no organomegaly Extremities: extremities normal, atraumatic, no cyanosis or edema Skin: Skin color, texture, turgor normal. No rashes or lesions Lymph nodes: Cervical, supraclavicular, and axillary nodes normal. No abnormal inguinal nodes palpated Neurologic: Grossly normal   Pelvic: External genitalia:  no lesions              Urethra:  normal appearing urethra with no masses, tenderness or lesions              Bartholins and Skenes: normal                 Vagina: normal appearing vagina with normal color and discharge, no lesions              Cervix: no lesions              Pap taken: Yes.   Bimanual Exam:  Uterus:  normal size, contour, position, consistency, mobility, non-tender              Adnexa: no mass, fullness, tenderness               Rectovaginal: Confirms               Anus:   normal sphincter tone, no lesions  Chaperone, Terence Lux, CMA, was present for exam.  A:  Well Woman with normal exam PMP, no HRT Atrophic vaginal changes Hypertension Elevated lipids H/o thyroid nodules  P:  Mammogram guidelines reviewed.  Doing yearly pap smear obtained at patient's request.  Guidelines reviewed today. Lab work done with Dr. Quay Burow Is having five year's thyroid follow up for mid left thyroid lobe nodule Vaccines reviewed.  Shingrix specifically discussed.  Pt is considering. Colonoscopy neg 2016. Follow up 10 years. BMD 2018 return annually or prn

## 2020-08-20 ENCOUNTER — Encounter: Payer: Self-pay | Admitting: Obstetrics & Gynecology

## 2020-08-20 ENCOUNTER — Ambulatory Visit (INDEPENDENT_AMBULATORY_CARE_PROVIDER_SITE_OTHER): Payer: BC Managed Care – PPO | Admitting: Obstetrics & Gynecology

## 2020-08-20 ENCOUNTER — Other Ambulatory Visit (HOSPITAL_COMMUNITY)
Admission: RE | Admit: 2020-08-20 | Discharge: 2020-08-20 | Disposition: A | Discharge status: Home / Self Care | Payer: BC Managed Care – PPO | Source: Ambulatory Visit | Attending: Obstetrics & Gynecology | Admitting: Obstetrics & Gynecology

## 2020-08-20 ENCOUNTER — Other Ambulatory Visit: Payer: Self-pay

## 2020-08-20 VITALS — BP 120/78 | HR 68 | Resp 16 | Ht 65.5 in | Wt 138.0 lb

## 2020-08-20 DIAGNOSIS — Z124 Encounter for screening for malignant neoplasm of cervix: Secondary | ICD-10-CM

## 2020-08-20 DIAGNOSIS — Z01419 Encounter for gynecological examination (general) (routine) without abnormal findings: Secondary | ICD-10-CM | POA: Diagnosis not present

## 2020-08-20 DIAGNOSIS — Z0000 Encounter for general adult medical examination without abnormal findings: Secondary | ICD-10-CM

## 2020-08-20 LAB — POCT URINALYSIS DIPSTICK
Bilirubin, UA: NEGATIVE
Blood, UA: NEGATIVE
Glucose, UA: NEGATIVE
Ketones, UA: NEGATIVE
Leukocytes, UA: NEGATIVE
Nitrite, UA: NEGATIVE
Protein, UA: NEGATIVE
Urobilinogen, UA: NEGATIVE E.U./dL — AB
pH, UA: 5 (ref 5.0–8.0)

## 2020-08-20 MED ORDER — ESTRADIOL 0.1 MG/GM VA CREA: TOPICAL_CREAM | VAGINAL | 4 refills | Status: DC

## 2020-08-24 LAB — CYTOLOGY - PAP
Comment: NEGATIVE
Diagnosis: NEGATIVE
High risk HPV: NEGATIVE

## 2020-09-01 DIAGNOSIS — M545 Low back pain: Secondary | ICD-10-CM | POA: Diagnosis not present

## 2020-10-11 ENCOUNTER — Other Ambulatory Visit: Payer: Self-pay | Admitting: Internal Medicine

## 2020-10-11 NOTE — Telephone Encounter (Signed)
Last refill 05/25/20 (per narcotic website) Last OV 03/08/20 Next OV: none scheduled

## 2020-11-04 NOTE — Progress Notes (Signed)
Cardiology Office Note   Date:  11/05/2020   ID:  Tiffany Moran, DOB 14-Feb-1956, MRN [id number]  PCP:  Binnie Rail, MD  Cardiologist:   Dorris Carnes, MD   F/U of palpitations and mild CAD (by CT scan in 2016)    History of Present Illness: Tiffany Moran is a 64 y.o. female with a history of palpitations  Also a history of abnormal myovue Cardiac CT showed mild nonobstructive CAD (2016)  The pt has had event monitor in the past for palpitationns     THis showed SR with short bursts of SVT and NSVT   I last saw the pt in clinic last spring.  In the interval she has done okay.  She still notes palpitations at times she says sometimes when she is laying in bed.  Sometimes they will wake her from sleep. Occasional dizziness though not always was palpitations.  She is doing some walkin; knows she needs to do more.  She does say that she has to breathe out more from her mouth  [facility] feels like her breathing is off when she is walking.  She denies chest pain.  Outpatient Medications Prior to Visit  Medication Sig Dispense Refill  . ALPRAZolam (XANAX) 0.5 MG tablet TAKE ONE TABLET BY MOUTH EVERY 8 HOURS AS NEEDED FOR ANIXETY 15 tablet 1  . Cholecalciferol (VITAMIN D) 2000 units CAPS Take 2,000 Units by mouth daily.    Marland Kitchen estradiol (ESTRACE VAGINAL) 0.1 MG/GM vaginal cream 1 gram pv twice weekly. 42.5 g 4  . ezetimibe (ZETIA) 10 MG tablet TAKE 1 TABLET BY MOUTH DAILY 90 tablet 3  . loperamide (IMODIUM) 2 MG capsule Take 1 capsule (2 mg total) by mouth 2 (two) times daily as needed for diarrhea or loose stools. 10 capsule 0  . Loteprednol Etabonate (LOTEMAX OP) Apply to eye as needed.     . promethazine (PHENERGAN) 25 MG suppository Place 1 suppository (25 mg total) rectally every 6 (six) hours as needed for nausea or vomiting. 5 each 1  . rosuvastatin (CRESTOR) 5 MG tablet TAKE ONE TABLET BY MOUTH DAILY 90 tablet 2  . diltiazem (CARDIZEM) 30 MG tablet Take 1 tablet (30 mg total) by mouth daily  after lunch. 90 tablet 3  . metoprolol succinate (TOPROL XL) 25 MG 24 hr tablet Take 1.5 tablets (37.5 mg total) by mouth 2 (two) times daily. 270 tablet 3   No facility-administered medications prior to visit.     Allergies:   Lipitor [atorvastatin]   Past Medical History:  Diagnosis Date  . Cardiospasm 1983   felt may be related to MVP  . Hyperlipidemia   . Hypertension   . Kidney cysts 11/2015   cyst on one of her kidneys, no treatment required  . Mitral valve prolapse   . PONV (postoperative nausea and vomiting)    teeth chipped  . Raynaud disease   . Retinal hole of right eye   . Shingles 2012   leg  . Tachycardia     Past Surgical History:  Procedure Laterality Date  . COLONOSCOPY  2007   [place], [place] ,[name]  . DILATATION & CURETTAGE/HYSTEROSCOPY WITH MYOSURE N/A 08/29/2016   Procedure: DILATATION & CURETTAGE/HYSTEROSCOPY;  Surgeon: Salvadore Dom, MD;  Location: [facility];  Service: Gynecology;  Laterality: N/A;  . DILATION AND CURETTAGE OF UTERUS    . EXCISION OF SKIN TAG Left 08/29/2016   Procedure: EXCISION OF SKIN TAG left labia majora;  Surgeon: Salvadore Dom,  MD;  Location: [facility];  Service: Gynecology;  Laterality: Left;  . EXPLORATORY LAPAROTOMY     X2 for pain: fibroid, enlarged oviduct  . RETINAL LASER PROCEDURE Right 08/2015  . SALPINGECTOMY  2007 or 2008   and fibroid removed  . TUBAL LIGATION       Social History:  The patient  reports that she has never smoked. She has never used smokeless tobacco. She reports that she does not drink alcohol and does not use drugs.   Family History:  The patient's family history includes Healthy in her daughter and son; Heart attack (age of onset: 63) in her father; Hyperlipidemia in her brother, father, and mother; Hypertension in her father and mother; Hypothyroidism in her mother; Leukemia (age of onset: 27) in her mother; Myasthenia gravis in her father; Osteoporosis in her father; Prostate cancer in  her brother; Stroke in her maternal grandfather.    ROS:  Please see the history of present illness. All other systems are reviewed and  Negative to the above problem except as noted.    PHYSICAL EXAM: VS:  BP 138/72   Pulse 68   Ht 5\' 6"  (1.676 m)   Wt 146 lb 3.2 oz (66.3 kg)   LMP 06/17/2009 (Approximate)   SpO2 99%   BMI 23.60 kg/m   GEN: Well nourished, well developed, in no acute distress  HEENT: normal  Neck: JVP is norrmal Cardiac: RRR; no murmurs  no edema  Respiratory:  clear to auscultation bilaterally, normal work of breathing GI: soft, nontender, nondistended, + BS  No hepatomegaly  MS: no deformity Moving all extremities   Skin: warm and dry, Erythmatous lesions on fingers   (near knuckes) Neuro:  Strength and sensation are intact Psych: euthymic mood, full affect   EKG:  EKG is not ordered today  Lipid Panel    Component Value Date/Time   CHOL 169 03/08/2020 0938   CHOL 157 10/07/2019 0738   TRIG 87.0 03/08/2020 0938   HDL 71.60 03/08/2020 0938   HDL 72 10/07/2019 0738   CHOLHDL 2 03/08/2020 0938   VLDL 17.4 03/08/2020 0938   LDLCALC 80 03/08/2020 0938   LDLCALC 71 10/07/2019 0738    ECHO 02/18/19  1. The left ventricle has normal systolic function with an ejection  fraction of 60-65%. The cavity size was normal. Left ventricular diastolic  parameters were normal No evidence of left ventricular regional wall  motion abnormalities.  2. The right ventricle has normal systolic function. The cavity size was  normal. There is no increase in right ventricular wall thickness. Right  ventricular systolic pressure is normal with an estimated pressure of 29  mmHg.  3. Mild myxomatous mitral valve prolapse.  4. Mild late systolic mitral insufficiency with a centrally-directed jet.  5. The average left ventricular global longitudinal strain is -21.6 %.  6. The aortic valve is normal in structure.   CT coronary agniogram 2016  Aorta:  Normal caliber, no  calcifications, no dissection.  Aortic Valve:  Trileaflet, no calcifications.  Coronary Arteries: Originating in normal position. Right dominance.  Left main is a large vessel that gives rise to LAS, ramus intermedius and LCX arteries.  LAD is a large caliber vessel that wraps around the apex and gives rise to one diagonal branch. There is a mild calcified plaque in the proximal LAD associated with 25-50% stenosis, the rest of LAD/D1 is free of plaque.  RI is medium caliber vessel free of plaque.  LCX artery is  a medium caliber non-dominant vessel that gives rise to one rather small obtuse marginal branch. There is no plaque.  RCA is a large caliber vessel that gives rise to PDA and PLA. There is minimal non-obstructive plaque.  IMPRESSION: 1. Coronary calcium score of 8. This was 54 percentile for age and sex matched control.  2. Normal coronary origin.  Right dominance.  3. Mild non-obstructive calcified plaque in the proximal LAD. An aggressive risk factor modification is recommended.    Electronically Signed   By: Ena Dawley   On: 06/01/2015 10:50          Wt Readings from Last 3 Encounters:  11/05/20 146 lb 3.2 oz (66.3 kg)  08/20/20 138 lb (62.6 kg)  03/08/20 142 lb 6.4 oz (64.6 kg)      ASSESSMENT AND PLAN:  1  Palpitations the patient still being bothered by palpitations.  Spells sound like  Isolated beats or short bursts of skips  Nothing prolonged.  She does note some fatigue she thinks from the metoprolol.  She also says in the middle of the day her she feels like things smooth out when she takes a diltiazem.   I wonder if a longer acting diltiazem (120 CD) give her more control of skipds  I would also back down on her Toprol-XL to 25 twice daily (from 37.5 twice daily).  Add Dilt CD 120   I encouraged her to stay active.  In particular I have asked her to do some Jazzercise type maneuvers where she can really get her heart rate  muscles moving.  Requires only about 10 minutes/day.  I am not concerned about the breathing.   2  CAD  Mild nonobstructive plaque on CT coronary angiogram in 2016.  Again I am not convinced of active angina     3  HTN  Follow BP with changes  4 dyslipidemia we will check lipids today  5 osteopenia.  Will check vitamin D level.  Plan for follow-up in April.   Signed, Dorris Carnes, MD  11/05/2020 8:47 AM    [address], [place], [place]  [address] Phone: [phone]; Fax: [phone]

## 2020-11-05 ENCOUNTER — Other Ambulatory Visit: Payer: Self-pay

## 2020-11-05 ENCOUNTER — Ambulatory Visit (INDEPENDENT_AMBULATORY_CARE_PROVIDER_SITE_OTHER): Payer: Medicare Other | Admitting: Internal Medicine

## 2020-11-05 ENCOUNTER — Encounter: Payer: Self-pay | Admitting: Internal Medicine

## 2020-11-05 VITALS — BP 138/72 | HR 68 | Ht 66.0 in | Wt 146.2 lb

## 2020-11-05 DIAGNOSIS — R0602 Shortness of breath: Secondary | ICD-10-CM

## 2020-11-05 DIAGNOSIS — E559 Vitamin D deficiency, unspecified: Secondary | ICD-10-CM

## 2020-11-05 DIAGNOSIS — E785 Hyperlipidemia, unspecified: Secondary | ICD-10-CM | POA: Diagnosis not present

## 2020-11-05 DIAGNOSIS — R002 Palpitations: Secondary | ICD-10-CM | POA: Diagnosis not present

## 2020-11-05 MED ORDER — METOPROLOL SUCCINATE ER 25 MG PO TB24: 25.0000 mg | ORAL_TABLET | Freq: Every day | ORAL | 3 refills | Status: DC

## 2020-11-05 MED ORDER — DILTIAZEM HCL ER COATED BEADS 120 MG PO CP24: 120.0000 mg | ORAL_CAPSULE | Freq: Every day | ORAL | 3 refills | Status: DC

## 2020-11-05 MED ORDER — METOPROLOL SUCCINATE ER 25 MG PO TB24: 25.0000 mg | ORAL_TABLET | Freq: Two times a day (BID) | ORAL | 3 refills | Status: DC

## 2020-11-05 NOTE — Patient Instructions (Addendum)
Medication Instructions:  Your physician has recommended you make the following change in your medication:   1.) stop diltiazem 30 mg 2.) start diltiazem (Cardizem CD) 120 mg - one tablet daily 3.) decrease Toprol XL to 25 mg twice daily  *If you need a refill on your cardiac medications before your next appointment, please call your pharmacy*   Lab Work: Today: vit D, cbc, bmet, lipids  If you have labs (blood work) drawn today and your tests are completely normal, you will receive your results only by:  [place] (if you have MyChart) OR  A paper copy in the mail If you have any lab test that is abnormal or we need to change your treatment, we will call you to review the results.   Testing/Procedures: none   Follow-Up: At [facility], you and your health needs are our priority.  As part of our continuing mission to provide you with exceptional heart care, we have created designated Provider Care Teams.  These Care Teams include your primary Cardiologist (physician) and Advanced Practice Providers (APPs -  Physician Assistants and Nurse Practitioners) who all work together to provide you with the care you need, when you need it.   Your next appointment:   5 month(s)  The format for your next appointment:   In Person  Provider:   You may see Dorris Carnes, MD or one of the following Advanced Practice Providers on your designated Care Team:    Richardson Dopp, PA-C  Robbie Lis, [place]    Other Instructions

## 2020-11-06 LAB — CBC
Hematocrit: 41.2 % (ref 34.0–46.6)
Hemoglobin: 13.7 g/dL (ref 11.1–15.9)
MCH: 30.3 pg (ref 26.6–33.0)
MCHC: 33.3 g/dL (ref 31.5–35.7)
MCV: 91 fL (ref 79–97)
Platelets: 168 10*3/uL (ref 150–450)
RBC: 4.52 x10E6/uL (ref 3.77–5.28)
RDW: 12.1 % (ref 11.7–15.4)
WBC: 4.7 10*3/uL (ref 3.4–10.8)

## 2020-11-06 LAB — LIPID PANEL
Chol/HDL Ratio: 2 ratio (ref 0.0–4.4)
Cholesterol, Total: 158 mg/dL (ref 100–199)
HDL: 78 mg/dL (ref >39)
LDL Chol Calc (NIH): 66 mg/dL (ref 0–99)
Triglycerides: 76 mg/dL (ref 0–149)
VLDL Cholesterol Cal: 14 mg/dL (ref 5–40)

## 2020-11-06 LAB — BASIC METABOLIC PANEL
BUN/Creatinine Ratio: 14 (ref 12–28)
BUN: 12 mg/dL (ref 8–27)
CO2: 26 mmol/L (ref 20–29)
Calcium: 9.7 mg/dL (ref 8.7–10.3)
Chloride: 104 mmol/L (ref 96–106)
Creatinine, Ser: 0.84 mg/dL (ref 0.57–1.00)
GFR calc Af Amer: 84 mL/min/{1.73_m2} (ref >59)
GFR calc non Af Amer: 73 mL/min/{1.73_m2} (ref >59)
Glucose: 96 mg/dL (ref 65–99)
Potassium: 4.5 mmol/L (ref 3.5–5.2)
Sodium: 142 mmol/L (ref 134–144)

## 2020-11-06 LAB — VITAMIN D 25 HYDROXY (VIT D DEFICIENCY, FRACTURES): Vit D, 25-Hydroxy: 55.7 ng/mL (ref 30.0–100.0)

## 2020-11-08 ENCOUNTER — Telehealth: Payer: Self-pay

## 2020-11-08 NOTE — Telephone Encounter (Signed)
Patient advised Voltaren Gel is what was suggested at her last appointment. Patient expressed understanding.

## 2020-11-08 NOTE — Telephone Encounter (Signed)
Patient called stating she had an appointment with Dr. Estanislado Pandy in September of 2020 and it was recommended that she use a cream for her hand pain.  Patient states she was looking in her mychart for the name of the cream that Dr. Estanislado Pandy recommended and is checking if it is Voltaren Gel.  Patient requested a return call.

## 2020-11-29 ENCOUNTER — Encounter: Payer: Self-pay | Admitting: Internal Medicine

## 2021-01-28 ENCOUNTER — Other Ambulatory Visit: Payer: Self-pay | Admitting: Internal Medicine

## 2021-02-07 ENCOUNTER — Encounter: Payer: Self-pay | Admitting: Internal Medicine

## 2021-02-09 ENCOUNTER — Telehealth (HOSPITAL_BASED_OUTPATIENT_CLINIC_OR_DEPARTMENT_OTHER): Payer: Self-pay | Admitting: *Deleted

## 2021-02-09 NOTE — Telephone Encounter (Signed)
Pt called stating that she thinks she has a hemorrhoid about the size of a wart. She is asking what she can use that will not interfere with her BP medications.

## 2021-02-09 NOTE — Telephone Encounter (Signed)
LMOVM for pt to return my call.  

## 2021-02-09 NOTE — Telephone Encounter (Signed)
It is ok to use any OTC preparation H product.  She should start with the cream.  If does not resolve, improve, I'm happy to look and make additional recommendations.

## 2021-02-11 ENCOUNTER — Encounter: Payer: Self-pay | Admitting: Family

## 2021-02-11 ENCOUNTER — Ambulatory Visit (INDEPENDENT_AMBULATORY_CARE_PROVIDER_SITE_OTHER): Payer: Medicare Other | Admitting: Family

## 2021-02-11 ENCOUNTER — Other Ambulatory Visit: Payer: Self-pay

## 2021-02-11 VITALS — BP 102/68 | HR 71 | Temp 98.2°F

## 2021-02-11 DIAGNOSIS — K649 Unspecified hemorrhoids: Secondary | ICD-10-CM

## 2021-02-11 MED ORDER — HYDROCORTISONE (PERIANAL) 2.5 % EX CREA: 1.0000 "application " | TOPICAL_CREAM | Freq: Two times a day (BID) | CUTANEOUS | 0 refills | Status: DC

## 2021-02-11 NOTE — Progress Notes (Signed)
Tiffany Moran is a 65 y.o. female with the following history as recorded in EpicCare:  Patient Active Problem List   Diagnosis Date Noted  . Osteoarthritis of right knee 03/08/2020  . Hypertension 01/22/2019  . Chronic sinusitis 09/11/2018  . 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 Anterior neck pain 04/10/2018  . Multiple thyroid nodules 12/03/2017  . Anxiety 09/26/2017  . Liver hemangioma 12/05/2016  . Renal cyst, right 12/05/2016  . 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 Pes anserine bursitis 04/21/2014  . 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 Osteopenia 08/04/2011  . Vitamin D deficiency 08/04/2011  . History of mitral valve prolapse 07/26/2010  . 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 Raynaud's syndrome 01/05/2010  . Hyperlipidemia 08/17/2009  . POLYARTHRITIS 08/17/2009    Current Outpatient Medications  Medication Sig Dispense Refill  . hydrocortisone (ANUSOL-HC) 2.5 % rectal cream Place 1 application rectally 2 (two) times daily. 30 g 0  . ALPRAZolam (XANAX) 0.5 MG tablet TAKE ONE TABLET BY MOUTH EVERY 8 HOURS AS NEEDED FOR ANIXETY 15 tablet 1  . Cholecalciferol (VITAMIN D) 2000 units CAPS Take 2,000 Units by mouth daily.    Marland Kitchen diltiazem (CARDIZEM CD) 120 MG 24 hr capsule Take 1 capsule (120 mg total) by mouth daily. 90 capsule 3  . estradiol (ESTRACE VAGINAL) 0.1 MG/GM vaginal cream 1 gram pv twice weekly. 42.5 g 4  . ezetimibe (ZETIA) 10 MG tablet TAKE 1 TABLET BY MOUTH DAILY 90 tablet 3  . loperamide (IMODIUM) 2 MG capsule Take 1 capsule (2 mg total) by mouth 2 (two) times daily as needed for diarrhea or loose stools. 10 capsule 0  . Loteprednol Etabonate (LOTEMAX OP) Apply to eye as needed.     . metoprolol succinate (TOPROL XL) 25 MG 24 hr tablet Take 1 tablet (25 mg total) by mouth in the morning and at bedtime. 90 tablet 3  . promethazine (PHENERGAN) 25 MG suppository Place 1 suppository (25 mg total) rectally every 6 (six) hours as needed for  nausea or vomiting. 5 each 1  . rosuvastatin (CRESTOR) 5 MG tablet TAKE ONE TABLET BY MOUTH DAILY 90 tablet 3   No current facility-administered medications for this visit.    Allergies: Lipitor [atorvastatin] and Lovastatin  Past Medical History:  Diagnosis Date  . Cardiospasm 1983   felt may be related to MVP  . Hyperlipidemia   . Hypertension   . Kidney cysts 11/2015   cyst on one of her kidneys, no treatment required  . Mitral valve prolapse   . PONV (postoperative nausea and vomiting)    teeth chipped  . Raynaud disease   . Retinal hole of right eye   . Shingles 2012   leg  . Tachycardia     Past Surgical History:  Procedure Laterality Date  . COLONOSCOPY  2007   [place], [place] ,[name]  . DILATATION & CURETTAGE/HYSTEROSCOPY WITH MYOSURE N/A 08/29/2016   Procedure: DILATATION & CURETTAGE/HYSTEROSCOPY;  Surgeon: Salvadore Dom, MD;  Location: [facility];  Service: Gynecology;  Laterality: N/A;  . DILATION AND CURETTAGE OF UTERUS    . EXCISION OF SKIN TAG Left 08/29/2016   Procedure: EXCISION OF SKIN TAG left labia majora;  Surgeon: Salvadore Dom, MD;  Location: [facility];  Service: Gynecology;  Laterality: Left;  . EXPLORATORY LAPAROTOMY     X2 for pain: fibroid, enlarged oviduct  . RETINAL LASER PROCEDURE Right 08/2015  . SALPINGECTOMY  2007 or 2008   and fibroid removed  .  TUBAL LIGATION      Family History  Problem Relation Age of Onset  . Heart attack Father 7  . Myasthenia gravis Father   . Hypertension Father   . Hyperlipidemia Father   . Osteoporosis Father        due to steroids for Myasthenia Grvis  . Leukemia Mother 16  . Hyperlipidemia Mother   . Hypertension Mother   . Hypothyroidism Mother   . Prostate cancer Brother        in 82s  . Hyperlipidemia Brother   . Stroke Maternal Grandfather        in 65s  . Healthy Son   . Healthy Daughter   . Diabetes Neg Hx     Social History   Tobacco Use  . Smoking status: Never Smoker  . Smokeless  tobacco: Never Used  Substance Use Topics  . Alcohol use: No    Subjective:  1 week history of rectal pain; known history of internal hemorrhoids; limited relief with topical OTC medications; has noticed recently that she has felt some discomfort/ sensation of "stuck poop" with bowel movements; colonoscopy is up to date;   Objective:  Vitals:   02/11/21 1550  BP: 102/68  Pulse: 71  Temp: 98.2 F (36.8 C)  TempSrc: Oral  SpO2: 97%    General: Well developed, well nourished, in no acute distress  Skin : Warm and dry.  Head: Normocephalic and atraumatic  Lungs: Respirations unlabored;  Neurologic: Alert and oriented; speech intact; face symmetrical; moves all extremities well; CNII-XII intact without focal deficit  Rectal exam- hemorrhoid noted;  Assessment:  1. Hemorrhoids, unspecified hemorrhoid type     Plan:  Limited response to OTC medication; will try treating with Anusol HC; will go ahead and refer to colo-rectal surgeon as patient said she already talked to her GI and they noted they would not be able to treat hemorrhoids either;  This visit occurred during the SARS-CoV-2 public health emergency.  Safety protocols were in place, including screening questions prior to the visit, additional usage of staff PPE, and extensive cleaning of exam room while observing appropriate contact time as indicated for disinfecting solutions.     No follow-ups on file.  Orders Placed This Encounter  Procedures  . Ambulatory referral to General Surgery    Referral Priority:   Routine    Referral Type:   Surgical    Referral Reason:   Specialty Services Required    Requested Specialty:   General Surgery    Number of Visits Requested:   1    Requested Prescriptions   Signed Prescriptions Disp Refills  . hydrocortisone (ANUSOL-HC) 2.5 % rectal cream 30 g 0    Sig: Place 1 application rectally 2 (two) times daily.

## 2021-03-09 DIAGNOSIS — R739 Hyperglycemia, unspecified: Secondary | ICD-10-CM | POA: Insufficient documentation

## 2021-03-09 NOTE — Patient Instructions (Addendum)
Tiffany Moran , Thank you for taking time to come for your Medicare Wellness Visit. I appreciate your ongoing commitment to your health goals. Please review the following plan we discussed and let me know if I can assist you in the future.   These are the goals we discussed: Goals   Continue doing what you are doing     This is a list of the screening recommended for you and due dates:  Health Maintenance  Topic Date Due  . DEXA scan (bone density measurement)  06/15/2020  . Pneumonia vaccines (1 of 2 - PCV13) Never done  . COVID-19 Vaccine (3 - Booster for Pfizer series) 03/26/2021*  . Flu Shot  04/26/2021*  . Mammogram  07/19/2022  . Pap Smear  08/21/2023  . Colon Cancer Screening  12/09/2025  . Tetanus Vaccine  05/15/2030  .  Hepatitis C: One time screening is recommended by Center for Disease Control  (CDC) for  adults born from [birth year] through 1966.   Completed  . HIV Screening  Completed  . HPV Vaccine  Aged Out  *Topic was postponed. The date shown is not the original due date.       Blood work was ordered.     Medications changes include :   none  Your prescription(s) have been submitted to your pharmacy. Please take as directed and contact our office if you believe you are having problem(s) with the medication(s).    Please followup in 1 year     Health Maintenance, Female Adopting a healthy lifestyle and getting preventive care are important in promoting health and wellness. Ask your health care provider about:  The right schedule for you to have regular tests and exams.  Things you can do on your own to prevent diseases and keep yourself healthy. What should I know about diet, weight, and exercise? Eat a healthy diet  Eat a diet that includes plenty of vegetables, fruits, low-fat dairy products, and lean protein.  Do not eat a lot of foods that are high in solid fats, added sugars, or sodium.   Maintain a healthy weight Body mass index (BMI) is used to  identify weight problems. It estimates body fat based on height and weight. Your health care provider can help determine your BMI and help you achieve or maintain a healthy weight. Get regular exercise Get regular exercise. This is one of the most important things you can do for your health. Most adults should:  Exercise for at least 150 minutes each week. The exercise should increase your heart rate and make you sweat (moderate-intensity exercise).  Do strengthening exercises at least twice a week. This is in addition to the moderate-intensity exercise.  Spend less time sitting. Even light physical activity can be beneficial. Watch cholesterol and blood lipids Have your blood tested for lipids and cholesterol at 66 years of age, then have this test every 5 years. Have your cholesterol levels checked more often if:  Your lipid or cholesterol levels are high.  You are older than 66 years of age.  You are at high risk for heart disease. What should I know about cancer screening? Depending on your health history and family history, you may need to have cancer screening at various ages. This may include screening for:  Breast cancer.  Cervical cancer.  Colorectal cancer.  Skin cancer.  Lung cancer. What should I know about heart disease, diabetes, and high blood pressure? Blood pressure and heart disease  High blood pressure  causes heart disease and increases the risk of stroke. This is more likely to develop in people who have high blood pressure readings, are of African descent, or are overweight.  Have your blood pressure checked: ? Every 3-5 years if you are 80-24 years of age. ? Every year if you are 25 years old or older. Diabetes Have regular diabetes screenings. This checks your fasting blood sugar level. Have the screening done:  Once every three years after age 20 if you are at a normal weight and have a low risk for diabetes.  More often and at a younger age if you  are overweight or have a high risk for diabetes. What should I know about preventing infection? Hepatitis B If you have a higher risk for hepatitis B, you should be screened for this virus. Talk with your health care provider to find out if you are at risk for hepatitis B infection. Hepatitis C Testing is recommended for:  Everyone born from [birth year] through 1966.  Anyone with known risk factors for hepatitis C. Sexually transmitted infections (STIs)  Get screened for STIs, including gonorrhea and chlamydia, if: ? You are sexually active and are younger than 66 years of age. ? You are older than 66 years of age and your health care provider tells you that you are at risk for this type of infection. ? Your sexual activity has changed since you were last screened, and you are at increased risk for chlamydia or gonorrhea. Ask your health care provider if you are at risk.  Ask your health care provider about whether you are at high risk for HIV. Your health care provider may recommend a prescription medicine to help prevent HIV infection. If you choose to take medicine to prevent HIV, you should first get tested for HIV. You should then be tested every 3 months for as long as you are taking the medicine. Pregnancy  If you are about to stop having your period (premenopausal) and you may become pregnant, seek counseling before you get pregnant.  Take 400 to 800 micrograms (mcg) of folic acid every day if you become pregnant.  Ask for birth control (contraception) if you want to prevent pregnancy. Osteoporosis and menopause Osteoporosis is a disease in which the bones lose minerals and strength with aging. This can result in bone fractures. If you are 63 years old or older, or if you are at risk for osteoporosis and fractures, ask your health care provider if you should:  Be screened for bone loss.  Take a calcium or vitamin D supplement to lower your risk of fractures.  Be given hormone  replacement therapy (HRT) to treat symptoms of menopause. Follow these instructions at home: Lifestyle  Do not use any products that contain nicotine or tobacco, such as cigarettes, e-cigarettes, and chewing tobacco. If you need help quitting, ask your health care provider.  Do not use street drugs.  Do not share needles.  Ask your health care provider for help if you need support or information about quitting drugs. Alcohol use  Do not drink alcohol if: ? Your health care provider tells you not to drink. ? You are pregnant, may be pregnant, or are planning to become pregnant.  If you drink alcohol: ? Limit how much you use to 0-1 drink a day. ? Limit intake if you are breastfeeding.  Be aware of how much alcohol is in your drink. In the [place] one drink equals one 12 oz bottle of beer (355  mL), one 5 oz glass of wine (148 mL), or one 1 oz glass of hard liquor (44 mL). General instructions  Schedule regular health, dental, and eye exams.  Stay current with your vaccines.  Tell your health care provider if: ? You often feel depressed. ? You have ever been abused or do not feel safe at home. Summary  Adopting a healthy lifestyle and getting preventive care are important in promoting health and wellness.  Follow your health care provider's instructions about healthy diet, exercising, and getting tested or screened for diseases.  Follow your health care provider's instructions on monitoring your cholesterol and blood pressure. This information is not intended to replace advice given to you by your health care provider. Make sure you discuss any questions you have with your health care provider. Document Revised: 11/27/2018 Document Reviewed: 11/27/2018 Elsevier Patient Education  2021 [employer].

## 2021-03-09 NOTE — Progress Notes (Addendum)
Subjective:    Patient ID: Tiffany Moran, female    DOB: 09/06/1956, 65 y.o.   MRN: [id number]   This visit occurred during the SARS-CoV-2 public health emergency.  Safety protocols were in place, including screening questions prior to the visit, additional usage of staff PPE, and extensive cleaning of exam room while observing appropriate contact time as indicated for disinfecting solutions.    HPI Here for welcome to medicare wellness exam.   I have personally reviewed and have noted 1.         The patient's medical and social history 2.         Their use of alcohol, tobacco or illicit drugs 3.         Their current medications and supplements 4.         The patient's functional ability including   ADL's, fall risks, home safety risk and   hearing or visual impairment. 5.         Diet and physical activities 6.         Evidence for depression or mood disorders 7.         Care team reviewed  - cardio - Dr Dorris Carnes, Gyn - Dr Hale Bogus, Rheum - Dr Estanislado Pandy, Eye doctor, Dermatology   New mole on left forehead.  It is dry.    Nose runs all the time.   Noisy at night per her husband.  She does not think that she snores, just is noisy.  Dad had OSA.     Saw rheum - right hand swells and aches - in knuckles.  Left wrist catches and it is painful.  advil helped.  It comes and goes.   Increased gas and burping.  ? From cholesterol medication.  She typically has a bowel movement every 2-3 days.  Surgery appt tomorrow - anal area.  Stool soft and large and hangs up at anus.  Initially the stool is harder and then soft stools per minute.  She goes every 2-3 days.  Used anusol -hc and it helped.  She is not sure if she really needs to see surgery or not.   Are there smokers in your home (other than you)? No  Risk Factors Exercise:  Walks daily Dietary issues discussed: well balances  Vitamin and supplement use:   Vitamin d daily  Opiod use:   N/A   Cardiac risk factors:  advanced age, hypertension, hyperlipidemia  Depression Screen  Have you felt down, depressed or hopeless? No  Have you felt little interest or pleasure in doing things?  No  Activities of Daily Living In your present state of health, do you have any difficulty performing the following activities?:  Driving? No Managing money?  No Feeding yourself? No Getting from bed to chair? No Climbing a flight of stairs? No Preparing food and eating?: No Bathing or showering? No Getting dressed: No Getting to/using the toilet? No Moving around from place to place: No In the past year have you fallen or had a near fall?: No   Are you sexually active?  yes  Do you have more than one partner?  No  Hearing Difficulties: No Do you often ask people to speak up or repeat themselves? No Do you experience ringing or noises in your ears? Yes, intermittent Do you have difficulty understanding soft or whispered voices? No Vision:              Any change in vision:  no  Up to date with eye exam:   yes  Memory:  Do you feel that you have a problem with memory? No  Do you often misplace items? No  Do you feel safe at home?  Yes  Cognitive Testing  Alert, Orientated? Yes  Normal Appearance? Yes  Recall of three objects?  Yes  Can perform simple calculations? Yes  Displays appropriate judgment? Yes  Can read the correct time from a watch face? Yes   Advanced Directives have been discussed with the patient? Yes     Medications and allergies reviewed with patient and updated if appropriate.  Patient Active Problem List   Diagnosis Date Noted  . Hyperglycemia 03/09/2021  . Osteoarthritis of right knee 03/08/2020  . Hypertension 01/22/2019  . Chronic sinusitis 09/11/2018  . 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 Anterior neck pain 04/10/2018  . Multiple thyroid nodules 12/03/2017  . Anxiety 09/26/2017  . Liver hemangioma 12/05/2016  . Renal cyst, right 12/05/2016  .  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 Pes anserine bursitis 04/21/2014  . 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 Osteopenia 08/04/2011  . Vitamin D deficiency 08/04/2011  . History of mitral valve prolapse 07/26/2010  . 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 Raynaud's syndrome 01/05/2010  . Hyperlipidemia 08/17/2009  . POLYARTHRITIS 08/17/2009    Current Outpatient Medications on File Prior to Visit  Medication Sig Dispense Refill  . Cholecalciferol (VITAMIN D) 2000 units CAPS Take 2,000 Units by mouth daily.    Marland Kitchen diltiazem (CARDIZEM CD) 120 MG 24 hr capsule Take 1 capsule (120 mg total) by mouth daily. 90 capsule 3  . estradiol (ESTRACE VAGINAL) 0.1 MG/GM vaginal cream 1 gram pv twice weekly. 42.5 g 4  . ezetimibe (ZETIA) 10 MG tablet TAKE 1 TABLET BY MOUTH DAILY 90 tablet 3  . hydrocortisone (ANUSOL-HC) 2.5 % rectal cream Place 1 application rectally 2 (two) times daily. 30 g 0  . loperamide (IMODIUM) 2 MG capsule Take 1 capsule (2 mg total) by mouth 2 (two) times daily as needed for diarrhea or loose stools. 10 capsule 0  . Loteprednol Etabonate (LOTEMAX OP) Apply to eye as needed.     . metoprolol succinate (TOPROL XL) 25 MG 24 hr tablet Take 1 tablet (25 mg total) by mouth in the morning and at bedtime. 90 tablet 3  . promethazine (PHENERGAN) 25 MG suppository Place 1 suppository (25 mg total) rectally every 6 (six) hours as needed for nausea or vomiting. 5 each 1  . rosuvastatin (CRESTOR) 5 MG tablet TAKE ONE TABLET BY MOUTH DAILY 90 tablet 3   No current facility-administered medications on file prior to visit.    Past Medical History:  Diagnosis Date  . Cardiospasm 1983   felt may be related to MVP  . Hyperlipidemia   . Hypertension   . Kidney cysts 11/2015   cyst on one of her kidneys, no treatment required  . Mitral valve prolapse   . PONV (postoperative nausea and vomiting)    teeth chipped  . Raynaud disease   . Retinal hole of right eye   .  Shingles 2012   leg  . Tachycardia     Past Surgical History:  Procedure Laterality Date  . COLONOSCOPY  2007   [place], [place] ,[name]  . DILATATION & CURETTAGE/HYSTEROSCOPY WITH MYOSURE N/A 08/29/2016   Procedure: DILATATION & CURETTAGE/HYSTEROSCOPY;  Surgeon: Salvadore Dom, MD;  Location: [facility];  Service: Gynecology;  Laterality: N/A;  . DILATION  AND CURETTAGE OF UTERUS    . EXCISION OF SKIN TAG Left 08/29/2016   Procedure: EXCISION OF SKIN TAG left labia majora;  Surgeon: Salvadore Dom, MD;  Location: [facility];  Service: Gynecology;  Laterality: Left;  . EXPLORATORY LAPAROTOMY     X2 for pain: fibroid, enlarged oviduct  . RETINAL LASER PROCEDURE Right 08/2015  . SALPINGECTOMY  2007 or 2008   and fibroid removed  . TUBAL LIGATION      Social History   Socioeconomic History  . Marital status: Married    Spouse name: Not on file  . Number of children: Not on file  . Years of education: Not on file  . Highest education level: Not on file  Occupational History  . Not on file  Tobacco Use  . Smoking status: Never Smoker  . Smokeless tobacco: Never Used  Vaping Use  . Vaping Use: Never used  Substance and Sexual Activity  . Alcohol use: No  . Drug use: No  . Sexual activity: Yes    Birth control/protection: Other-see comments, Post-menopausal    Comment: BTL  Other Topics Concern  . Not on file  Social History Narrative   Exercises regularly   Social Determinants of Health   Financial Resource Strain: Not on file  Food Insecurity: Not on file  Transportation Needs: Not on file  Physical Activity: Not on file  Stress: Not on file  Social Connections: Not on file    Family History  Problem Relation Age of Onset  . Heart attack Father 57  . Myasthenia gravis Father   . Hypertension Father   . Hyperlipidemia Father   . Osteoporosis Father        due to steroids for Myasthenia Grvis  . Leukemia Mother 27  . Hyperlipidemia Mother   . Hypertension  Mother   . Hypothyroidism Mother   . Prostate cancer Brother        in 20s  . Hyperlipidemia Brother   . Stroke Maternal Grandfather        in 46s  . Healthy Son   . Healthy Daughter   . Diabetes Neg Hx     Review of Systems  Constitutional: Negative for chills and fever.  Eyes: Negative for visual disturbance.  Respiratory: Negative for cough, shortness of breath and wheezing.   Cardiovascular: Positive for palpitations (intermittent). Negative for chest pain and leg swelling.  Gastrointestinal: Positive for rectal pain (occ diff having BM). Negative for abdominal pain, blood in stool, constipation, diarrhea and nausea.       Increased gas, no gerd  Genitourinary: Negative for dysuria.  Musculoskeletal: Positive for arthralgias (R hand).  Neurological: Positive for dizziness (mild at times) and light-headedness (with standing). Negative for headaches.  Psychiatric/Behavioral: Negative for dysphoric mood. The patient is nervous/anxious.        Objective:   Vitals:   03/10/21 0821  BP: 116/82  Pulse: 67  Temp: 98.2 F (36.8 C)  SpO2: 95%   Filed Weights   03/10/21 0821  Weight: 142 lb (64.4 kg)   Body mass index is 22.92 kg/m.  BP Readings from Last 3 Encounters:  03/10/21 116/82  02/11/21 102/68  11/05/20 138/72    Wt Readings from Last 3 Encounters:  03/10/21 142 lb (64.4 kg)  11/05/20 146 lb 3.2 oz (66.3 kg)  08/20/20 138 lb (62.6 kg)    Patient refused vision exam - she sees her eye doctor every 6 months and does not feel it is  necessary.   Depression screen [facility] 2/9 03/10/2021 03/08/2020 02/24/2019 02/22/2018  Decreased Interest 1 0 0 0  Down, Depressed, Hopeless 1 0 0 0  PHQ - 2 Score 2 0 0 0  Altered sleeping 0 - - -  Tired, decreased energy 0 - - -  Change in appetite 0 - - -  Feeling bad or failure about yourself  1 - - -  Trouble concentrating 0 - - -  Moving slowly or fidgety/restless 0 - - -  Suicidal thoughts 0 - - -  PHQ-9 Score 3 - - -     GAD 7 : Generalized Anxiety Score 03/10/2021  Nervous, Anxious, on Edge 1  Control/stop worrying 1  Worry too much - different things 1  Trouble relaxing 0  Restless 0  Easily annoyed or irritable 0  Afraid - awful might happen 1  Total GAD 7 Score 4  Anxiety Difficulty Not difficult at all        Physical Exam Constitutional: She appears well-developed and well-nourished. No distress.  HENT:  Head: Normocephalic and atraumatic.  Right Ear: External ear normal. Normal ear canal and TM Left Ear: External ear normal.  Normal ear canal and TM Mouth/Throat: Oropharynx is clear and moist.  Eyes: Conjunctivae and EOM are normal.  Neck: Neck supple. No tracheal deviation present. No thyromegaly present.  No carotid bruit  Cardiovascular: Normal rate, regular rhythm and normal heart sounds.   No murmur heard.  No edema. Pulmonary/Chest: Effort normal and breath sounds normal. No respiratory distress. She has no wheezes. She has no rales.  Breast: deferred   Abdominal: Soft. She exhibits no distension. There is no tenderness. GU: Skin tag present.  No thrombosed hemorrhoids.  Rectal exam deferred Lymphadenopathy: She has no cervical adenopathy.  Skin: Skin is warm and dry. She is not diaphoretic.  Dry area of skin left upper forehead Psychiatric: She has a normal mood and affect. Her behavior is normal.        Assessment & Plan:   Health Maintenance  Topic Date Due  . DEXA SCAN  06/15/2020  . COVID-19 Vaccine (3 - Booster for Pfizer series) 03/26/2021 (Originally 08/12/2020)  . INFLUENZA VACCINE  04/26/2021 (Originally 07/18/2020)  . PNA vac Low Risk Adult (1 of 2 - PCV13) 03/10/2022 (Originally 10/20/2020)  . MAMMOGRAM  07/19/2022  . PAP SMEAR-Modifier  08/21/2023  . COLONOSCOPY (Pts 45-82yrs Insurance coverage will need to be confirmed)  12/09/2025  . TETANUS/TDAP  05/15/2030  . Hepatitis C Screening  Completed  . HIV Screening  Completed  . HPV VACCINES  Aged Out      Wellness Exam: Immunizations  Pneumonia vaccine deferred , deferred flu vaccine, discussed shingrix-she does not want to have that. Colonoscopy   Up to date  Mammogram  Up to date  Dexa   due Gyn   Up to date  Eye exam   Up to date  Hearing loss   none Memory concerns/difficulties she admits she does have difficulty recalling why she walked into the room, but other than that no memory concerns-advised that this is normal for age 60 Independent of ADLs    fully independent Stressed the importance of regular exercise, which she does Discussed skin cancer screening.  She does monitor her skin and sees dermatology   EKG just done one year ago by Dr Harrington Challenger   Patient received copy of preventative screening tests/immunizations recommended for the next 5-10 years.    See Problem List for Assessment  and Plan of chronic medical problems.

## 2021-03-10 ENCOUNTER — Encounter: Payer: Self-pay | Admitting: Internal Medicine

## 2021-03-10 ENCOUNTER — Ambulatory Visit (INDEPENDENT_AMBULATORY_CARE_PROVIDER_SITE_OTHER): Payer: Medicare Other | Admitting: Internal Medicine

## 2021-03-10 ENCOUNTER — Other Ambulatory Visit: Payer: Self-pay

## 2021-03-10 VITALS — BP 116/82 | HR 67 | Temp 98.2°F | Ht 66.0 in | Wt 142.0 lb

## 2021-03-10 DIAGNOSIS — Z0000 Encounter for general adult medical examination without abnormal findings: Secondary | ICD-10-CM

## 2021-03-10 DIAGNOSIS — I10 Essential (primary) hypertension: Secondary | ICD-10-CM

## 2021-03-10 DIAGNOSIS — M19041 Primary osteoarthritis, right hand: Secondary | ICD-10-CM

## 2021-03-10 DIAGNOSIS — E559 Vitamin D deficiency, unspecified: Secondary | ICD-10-CM

## 2021-03-10 DIAGNOSIS — E782 Mixed hyperlipidemia: Secondary | ICD-10-CM

## 2021-03-10 DIAGNOSIS — I471 Supraventricular tachycardia, unspecified: Secondary | ICD-10-CM

## 2021-03-10 DIAGNOSIS — M85852 Other specified disorders of bone density and structure, left thigh: Secondary | ICD-10-CM | POA: Diagnosis not present

## 2021-03-10 DIAGNOSIS — E042 Nontoxic multinodular goiter: Secondary | ICD-10-CM

## 2021-03-10 DIAGNOSIS — F419 Anxiety disorder, unspecified: Secondary | ICD-10-CM

## 2021-03-10 DIAGNOSIS — R739 Hyperglycemia, unspecified: Secondary | ICD-10-CM | POA: Diagnosis not present

## 2021-03-10 LAB — CBC WITH DIFFERENTIAL/PLATELET
Basophils Absolute: 0 10*3/uL (ref 0.0–0.1)
Basophils Relative: 0.4 % (ref 0.0–3.0)
Eosinophils Absolute: 0 10*3/uL (ref 0.0–0.7)
Eosinophils Relative: 0.7 % (ref 0.0–5.0)
HCT: 42.4 % (ref 36.0–46.0)
Hemoglobin: 14.4 g/dL (ref 12.0–15.0)
Lymphocytes Relative: 20.7 % (ref 12.0–46.0)
Lymphs Abs: 1.1 10*3/uL (ref 0.7–4.0)
MCHC: 34 g/dL (ref 30.0–36.0)
MCV: 89.5 fl (ref 78.0–100.0)
Monocytes Absolute: 0.3 10*3/uL (ref 0.1–1.0)
Monocytes Relative: 5.4 % (ref 3.0–12.0)
Neutro Abs: 3.8 10*3/uL (ref 1.4–7.7)
Neutrophils Relative %: 72.8 % (ref 43.0–77.0)
Platelets: 157 10*3/uL (ref 150.0–400.0)
RBC: 4.74 Mil/uL (ref 3.87–5.11)
RDW: 13.3 % (ref 11.5–15.5)
WBC: 5.3 10*3/uL (ref 4.0–10.5)

## 2021-03-10 LAB — LIPID PANEL
Cholesterol: 175 mg/dL (ref 0–200)
HDL: 74.1 mg/dL (ref >39.00)
LDL Cholesterol: 84 mg/dL (ref 0–99)
NonHDL: 100.64
Total CHOL/HDL Ratio: 2
Triglycerides: 83 mg/dL (ref 0.0–149.0)
VLDL: 16.6 mg/dL (ref 0.0–40.0)

## 2021-03-10 LAB — COMPREHENSIVE METABOLIC PANEL
ALT: 27 U/L (ref 0–35)
AST: 21 U/L (ref 0–37)
Albumin: 4.8 g/dL (ref 3.5–5.2)
Alkaline Phosphatase: 83 U/L (ref 39–117)
BUN: 14 mg/dL (ref 6–23)
CO2: 30 mEq/L (ref 19–32)
Calcium: 9.9 mg/dL (ref 8.4–10.5)
Chloride: 104 mEq/L (ref 96–112)
Creatinine, Ser: 0.85 mg/dL (ref 0.40–1.20)
GFR: 71.91 mL/min (ref >60.00)
Glucose, Bld: 104 mg/dL — ABNORMAL HIGH (ref 70–99)
Potassium: 4.4 mEq/L (ref 3.5–5.1)
Sodium: 141 mEq/L (ref 135–145)
Total Bilirubin: 0.8 mg/dL (ref 0.2–1.2)
Total Protein: 7.3 g/dL (ref 6.0–8.3)

## 2021-03-10 LAB — TSH: TSH: 2.58 u[IU]/mL (ref 0.35–4.50)

## 2021-03-10 LAB — HEMOGLOBIN A1C: Hgb A1c MFr Bld: 5.6 % (ref 4.6–6.5)

## 2021-03-10 LAB — VITAMIN D 25 HYDROXY (VIT D DEFICIENCY, FRACTURES): VITD: 71.72 ng/mL (ref 30.00–100.00)

## 2021-03-10 MED ORDER — ALPRAZOLAM 0.5 MG PO TABS: ORAL_TABLET | ORAL | 1 refills | Status: DC

## 2021-03-10 NOTE — Assessment & Plan Note (Signed)
Chronic Sugars on high side - mild a1c

## 2021-03-10 NOTE — Assessment & Plan Note (Signed)
Chronic Blood pressure well controlled Management per cardiology Continue current medications CMP, CBC

## 2021-03-10 NOTE — Assessment & Plan Note (Signed)
Chronic Situational and intermittent Continue alprazolam 0.5 mg daily as needed-refill today

## 2021-03-10 NOTE — Assessment & Plan Note (Signed)
Chronic Taking vitamin D daily Check vitamin D level  

## 2021-03-10 NOTE — Assessment & Plan Note (Addendum)
Chronic Check lipid panel, cmp, tsh  Continue crestor 5 mg daily and zetia 10 mg daily Continue regular exercise and healthy diet

## 2021-03-10 NOTE — Assessment & Plan Note (Signed)
Chronic ?  DEXA due-she will check with [facility] or her GYN Continue regular walking Continue vitamin D-we will check level She tries to get enough calcium in her diet-stressed to make sure she is getting enough

## 2021-03-10 NOTE — Assessment & Plan Note (Signed)
Chronic Has been stable TSH

## 2021-03-10 NOTE — Assessment & Plan Note (Signed)
Chronic Management per Dr. Tania Ade controlled-on metoprolol and diltiazem TSH, CBC

## 2021-03-10 NOTE — Assessment & Plan Note (Signed)
Chronic Has seen rheumatology and no autoimmune disease present Has had some swelling in right knuckles intermittently and discomfort Symptomatic treatment discussed Can see hand orthopedics if she wishes for this and her wrist pain

## 2021-03-30 ENCOUNTER — Other Ambulatory Visit: Payer: Self-pay

## 2021-03-30 ENCOUNTER — Telehealth (INDEPENDENT_AMBULATORY_CARE_PROVIDER_SITE_OTHER): Payer: Medicare Other | Admitting: Internal Medicine

## 2021-03-30 ENCOUNTER — Encounter: Payer: Self-pay | Admitting: Internal Medicine

## 2021-03-30 DIAGNOSIS — J029 Acute pharyngitis, unspecified: Secondary | ICD-10-CM | POA: Diagnosis not present

## 2021-03-30 MED ORDER — AMOXICILLIN-POT CLAVULANATE 875-125 MG PO TABS: 1.0000 | ORAL_TABLET | Freq: Two times a day (BID) | ORAL | 0 refills | Status: DC

## 2021-03-30 MED ORDER — PREDNISONE 20 MG PO TABS: 20.0000 mg | ORAL_TABLET | Freq: Every day | ORAL | 0 refills | Status: DC

## 2021-03-30 NOTE — Progress Notes (Signed)
Virtual Visit via Video Note  I connected with Rosendo Gros on 03/30/21 at  2:20 PM EDT by a video enabled telemedicine application and verified that I am speaking with the correct person using two identifiers.   I discussed the limitations of evaluation and management by telemedicine and the availability of in person appointments. The patient expressed understanding and agreed to proceed.  Present for the visit:  Myself, Dr Billey Gosling, Rosendo Gros.  The patient is currently at home and I am in the office.    No referring provider.    History of Present Illness: She is here for an acute visit for cold symptoms.   Her symptoms started several days ago.  She is experiencing nasal congestion, postnasal drip, right ear pain, sore throat and cough.  She is concerned because her symptoms are getting worse and when she had this a couple of years ago it turned into a significant illness and took her a long time to recover.  She also worries about infections with how it may affect her heart.  She has taken 2 home COVID test and they have both been negative.  She has tried taking chlorphentermine, saline nasal spray and over-the-counter cold medications   Review of Systems  Constitutional: Negative for fever.  HENT: Positive for congestion (pnd), ear pain (right), sinus pain and sore throat.        No loss of taste or smell  Respiratory: Positive for cough (from PND). Negative for shortness of breath and wheezing.   Neurological: Negative for headaches.      Social History   Socioeconomic History  . Marital status: Married    Spouse name: Not on file  . Number of children: Not on file  . Years of education: Not on file  . Highest education level: Not on file  Occupational History  . Not on file  Tobacco Use  . Smoking status: Never Smoker  . Smokeless tobacco: Never Used  Vaping Use  . Vaping Use: Never used  Substance and Sexual Activity  . Alcohol use: No  . Drug use: No  .  Sexual activity: Yes    Birth control/protection: Other-see comments, Post-menopausal    Comment: BTL  Other Topics Concern  . Not on file  Social History Narrative   Exercises regularly   Social Determinants of Health   Financial Resource Strain: Not on file  Food Insecurity: Not on file  Transportation Needs: Not on file  Physical Activity: Not on file  Stress: Not on file  Social Connections: Not on file     Observations/Objective: Appears well in NAD Breathing normally  Assessment and Plan:  See Problem List for Assessment and Plan of chronic medical problems.   Follow Up Instructions:    I discussed the assessment and treatment plan with the patient. The patient was provided an opportunity to ask questions and all were answered. The patient agreed with the plan and demonstrated an understanding of the instructions.   The patient was advised to call back or seek an in-person evaluation if the symptoms worsen or if the condition fails to improve as anticipated.    Binnie Rail, MD

## 2021-03-30 NOTE — Assessment & Plan Note (Signed)
Acute Acute pharyngitis associated with right ear pain, some sinus tenderness, congestion, postnasal drip We are very limited since this is a virtual visit and is difficult to know if she has more of a sinus infection, ear infection or the possibility of strep We will treat empirically with an antibiotic-Augmentin 875-125 mg twice daily x10 days Given the ear discomfort and swelling that she is feeling in her throat will do a very short course of steroids, which she has had in the past for infections like this and it has been helpful-prednisone 20 mg daily x4 days Continue over-the-counter cold medications and call if no improvement

## 2021-03-31 MED ORDER — LEVALBUTEROL TARTRATE 45 MCG/ACT IN AERO: 1.0000 | INHALATION_SPRAY | RESPIRATORY_TRACT | 0 refills | Status: DC | PRN

## 2021-03-31 NOTE — Addendum Note (Signed)
Addended by: Binnie Rail on: 03/31/2021 10:53 AM   Modules accepted: Orders

## 2021-04-17 NOTE — Progress Notes (Signed)
Cardiology Office Note   Date:  04/18/2021   ID:  Tiffany Moran, DOB Mar 23, 1956, MRN [id number]  PCP:  Tiffany Rail, MD  Cardiologist:   Tiffany Carnes, MD   F/U of palpitations and mild CAD (by CT scan in 2016)    History of Present Illness: Tiffany Moran is a 65 y.o. female with a history of palpitations  Also a history of abnormal myovue Cardiac CT showed mild nonobstructive CAD (2016)  The pt has had event monitor in the past for palpitationns     THis showed SR with short bursts of SVT and NSVT   I saw the pt in Nov 2021  SInce seen the pt denies CP  Breathing is OK   No dizzienss    Has occasional palpitations   Short lived   Improved on current regimen    Occasionally takes an additional metoprolol    Complains of GI symptoms   Burping   Thinks it is since starting Zetia      Diet:  Br   Oatmeal:  Raisins   Egg   Lunch:   Salad  Veggies   Dinner  Oatmeal  Outpatient Medications Prior to Visit  Medication Sig Dispense Refill  . ALPRAZolam (XANAX) 0.5 MG tablet TAKE ONE TABLET BY MOUTH EVERY 8 HOURS AS NEEDED FOR ANIXETY 15 tablet 1  . Cholecalciferol (VITAMIN D) 2000 units CAPS Take 2,000 Units by mouth daily.    Marland Kitchen diltiazem (CARDIZEM CD) 120 MG 24 hr capsule Take 1 capsule (120 mg total) by mouth daily. 90 capsule 3  . estradiol (ESTRACE VAGINAL) 0.1 MG/GM vaginal cream 1 gram pv twice weekly. 42.5 g 4  . ezetimibe (ZETIA) 10 MG tablet TAKE 1 TABLET BY MOUTH DAILY 90 tablet 3  . hydrocortisone (ANUSOL-HC) 2.5 % rectal cream Place 1 application rectally 2 (two) times daily. 30 g 0  . levalbuterol (XOPENEX HFA) 45 MCG/ACT inhaler Inhale 1 puff into the lungs every 4 (four) hours as needed for wheezing or shortness of breath. 1 each 0  . loperamide (IMODIUM) 2 MG capsule Take 1 capsule (2 mg total) by mouth 2 (two) times daily as needed for diarrhea or loose stools. 10 capsule 0  . Loteprednol Etabonate (LOTEMAX OP) Apply to eye as needed.     . metoprolol succinate (TOPROL XL) 25  MG 24 hr tablet Take 1 tablet (25 mg total) by mouth in the morning and at bedtime. 90 tablet 3  . promethazine (PHENERGAN) 25 MG suppository Place 1 suppository (25 mg total) rectally every 6 (six) hours as needed for nausea or vomiting. 5 each 1  . rosuvastatin (CRESTOR) 5 MG tablet TAKE ONE TABLET BY MOUTH DAILY 90 tablet 3  . amoxicillin-clavulanate (AUGMENTIN) 875-125 MG tablet Take 1 tablet by mouth 2 (two) times daily. (Patient not taking: Reported on 04/18/2021) 20 tablet 0  . predniSONE (DELTASONE) 20 MG tablet Take 1 tablet (20 mg total) by mouth daily with breakfast. 4 tablet 0   No facility-administered medications prior to visit.     Allergies:   Lipitor [atorvastatin]   Past Medical History:  Diagnosis Date  . Cardiospasm 1983   felt may be related to MVP  . Hyperlipidemia   . Hypertension   . Kidney cysts 11/2015   cyst on one of her kidneys, no treatment required  . Mitral valve prolapse   . PONV (postoperative nausea and vomiting)    teeth chipped  . Raynaud disease   . Retinal  hole of right eye   . Shingles 2012   leg  . Tachycardia     Past Surgical History:  Procedure Laterality Date  . COLONOSCOPY  2007   [place], [place] ,[name]  . DILATATION & CURETTAGE/HYSTEROSCOPY WITH MYOSURE N/A 08/29/2016   Procedure: DILATATION & CURETTAGE/HYSTEROSCOPY;  Surgeon: Tiffany Dom, MD;  Location: [facility];  Service: Gynecology;  Laterality: N/A;  . DILATION AND CURETTAGE OF UTERUS    . EXCISION OF SKIN TAG Left 08/29/2016   Procedure: EXCISION OF SKIN TAG left labia majora;  Surgeon: Tiffany Dom, MD;  Location: [facility];  Service: Gynecology;  Laterality: Left;  . EXPLORATORY LAPAROTOMY     X2 for pain: fibroid, enlarged oviduct  . RETINAL LASER PROCEDURE Right 08/2015  . SALPINGECTOMY  2007 or 2008   and fibroid removed  . TUBAL LIGATION       Social History:  The patient  reports that she has never smoked. She has never used smokeless tobacco. She reports  that she does not drink alcohol and does not use drugs.   Family History:  The patient's family history includes Healthy in her daughter and son; Heart attack (age of onset: 64) in her father; Hyperlipidemia in her brother, father, and mother; Hypertension in her father and mother; Hypothyroidism in her mother; Leukemia (age of onset: 42) in her mother; Myasthenia gravis in her father; Osteoporosis in her father; Prostate cancer in her brother; Stroke in her maternal grandfather.    ROS:  Please see the history of present illness. All other systems are reviewed and  Negative to the above problem except as noted.    PHYSICAL EXAM: VS:  BP 122/68   Pulse 74   Ht 5\' 6"  (1.676 m)   Wt 143 lb 12.8 oz (65.2 kg)   LMP 06/17/2009 (Approximate)   SpO2 99%   BMI 23.21 kg/m   GEN: Well nourished, well developed, in no acute distress  HEENT: normal  Neck: JVP is norrmal Cardiac: RRR; no murmurs  no LE edema  Respiratory:  clear to auscultation bilaterally,  GI: soft, nontender, nondistended, + BS  No hepatomegaly  MS: no deformity Moving all extremities   Skin: warm and dry, Erythmatous lesions toes  Neuro:  Strength and sensation are intact Psych: euthymic mood, full affect   EKG:  EKG shows SR 74 bpm    Lipid Panel    Component Value Date/Time   CHOL 175 03/10/2021 0926   CHOL 158 11/05/2020 0858   TRIG 83.0 03/10/2021 0926   HDL 74.10 03/10/2021 0926   HDL 78 11/05/2020 0858   CHOLHDL 2 03/10/2021 0926   VLDL 16.6 03/10/2021 0926   LDLCALC 84 03/10/2021 0926   LDLCALC 66 11/05/2020 0858    ECHO 02/18/19  1. The left ventricle has normal systolic function with an ejection  fraction of 60-65%. The cavity size was normal. Left ventricular diastolic  parameters were normal No evidence of left ventricular regional wall  motion abnormalities.  2. The right ventricle has normal systolic function. The cavity size was  normal. There is no increase in right ventricular wall thickness.  Right  ventricular systolic pressure is normal with an estimated pressure of 29  mmHg.  3. Mild myxomatous mitral valve prolapse.  4. Mild late systolic mitral insufficiency with a centrally-directed jet.  5. The average left ventricular global longitudinal strain is -21.6 %.  6. The aortic valve is normal in structure.   CT coronary agniogram 2016  Aorta:  Normal caliber, no calcifications, no dissection.  Aortic Valve:  Trileaflet, no calcifications.  Coronary Arteries: Originating in normal position. Right dominance.  Left main is a large vessel that gives rise to LAS, ramus intermedius and LCX arteries.  LAD is a large caliber vessel that wraps around the apex and gives rise to one diagonal branch. There is a mild calcified plaque in the proximal LAD associated with 25-50% stenosis, the rest of LAD/D1 is free of plaque.  RI is medium caliber vessel free of plaque.  LCX artery is a medium caliber non-dominant vessel that gives rise to one rather small obtuse marginal branch. There is no plaque.  RCA is a large caliber vessel that gives rise to PDA and PLA. There is minimal non-obstructive plaque.  IMPRESSION: 1. Coronary calcium score of 8. This was 87 percentile for age and sex matched control.  2. Normal coronary origin.  Right dominance.  3. Mild non-obstructive calcified plaque in the proximal LAD. An aggressive risk factor modification is recommended.    Electronically Signed   By: Ena Dawley   On: 06/01/2015 10:50          Wt Readings from Last 3 Encounters:  04/18/21 143 lb 12.8 oz (65.2 kg)  03/10/21 142 lb (64.4 kg)  11/05/20 146 lb 3.2 oz (66.3 kg)      ASSESSMENT AND PLAN:  1  Palpitations The pt says her symptoms ae improved on current regimen   Takes an occasional extra metoprolol for breakthrough   OK to take a couple more    2  CAD  Mild nonobstructive plaque on CT coronary angiogram in 2016.No symtpsom of  angina   Follow  3  HTN  BP is controled    4 dyslipidemia Pt on Zetia and Crestor   Complaining of GI problesm (burping)  Stop Zetia  Follow symptoms   Check lipids in 5 months.  5  Glucose  Recent glucose was 104  A1C 5.6   Discussed carbs  Will recheck in 5 months     6  Diet   Discussed   Cut back on rasins, sugars.  Plain yogurt instead of sweetened.    Increase fiber   Try Align     Plan for f/u next winter    Signed, Tiffany Carnes, MD  04/18/2021 4:00 PM    [facility], [place], [place]  [address] Phone: [phone]; Fax: [phone]

## 2021-04-18 ENCOUNTER — Ambulatory Visit (INDEPENDENT_AMBULATORY_CARE_PROVIDER_SITE_OTHER): Payer: Medicare Other | Admitting: Internal Medicine

## 2021-04-18 ENCOUNTER — Encounter: Payer: Self-pay | Admitting: Internal Medicine

## 2021-04-18 ENCOUNTER — Other Ambulatory Visit: Payer: Self-pay

## 2021-04-18 VITALS — BP 122/68 | HR 74 | Ht 66.0 in | Wt 143.8 lb

## 2021-04-18 DIAGNOSIS — R0602 Shortness of breath: Secondary | ICD-10-CM

## 2021-04-18 DIAGNOSIS — E782 Mixed hyperlipidemia: Secondary | ICD-10-CM

## 2021-04-18 DIAGNOSIS — R002 Palpitations: Secondary | ICD-10-CM | POA: Diagnosis not present

## 2021-04-18 DIAGNOSIS — Z79899 Other long term (current) drug therapy: Secondary | ICD-10-CM

## 2021-04-18 DIAGNOSIS — E785 Hyperlipidemia, unspecified: Secondary | ICD-10-CM | POA: Diagnosis not present

## 2021-04-18 NOTE — Patient Instructions (Addendum)
Medication Instructions:  Your physician recommends that you continue on your current medications as directed. Please refer to the Current Medication list given to you today.  Stop Zetia   *If you need a refill on your cardiac medications before your next appointment, please call your pharmacy*   Lab Work: In 5 months HGBA1C, BMET, and fasting Lipids If you have labs (blood work) drawn today and your tests are completely normal, you will receive your results only by: Marland Kitchen MyChart Message (if you have MyChart) OR . A paper copy in the mail If you have any lab test that is abnormal or we need to change your treatment, we will call you to review the results.   Testing/Procedures: none   Follow-Up: At [facility], you and your health needs are our priority.  As part of our continuing mission to provide you with exceptional heart care, we have created designated Provider Care Teams.  These Care Teams include your primary Cardiologist (physician) and Advanced Practice Providers (APPs -  Physician Assistants and Nurse Practitioners) who all work together to provide you with the care you need, when you need it.  We recommend signing up for the patient portal called "MyChart".  Sign up information is provided on this After Visit Summary.  MyChart is used to connect with patients for Virtual Visits (Telemedicine).  Patients are able to view lab/test results, encounter notes, upcoming appointments, etc.  Non-urgent messages can be sent to your provider as well.   To learn more about what you can do with MyChart, go to [email or web address].    Your next appointment:   8 month(s)  The format for your next appointment:   In Person  Provider:   Dorris Carnes, MD   Other Instructions   Heart-Healthy Eating Plan Many factors influence your heart (coronary) health, including eating and exercise habits. Coronary risk increases with abnormal blood fat (lipid) levels. Heart-healthy meal planning  includes limiting unhealthy fats, increasing healthy fats, and making other diet and lifestyle changes. What is my plan? Your health care provider may recommend that you:  Limit your fat intake to _________% or less of your total calories each day.  Limit your saturated fat intake to _________% or less of your total calories each day.  Limit the amount of cholesterol in your diet to less than _________ mg per day. What are tips for following this plan? Cooking Cook foods using methods other than frying. Baking, boiling, grilling, and broiling are all good options. Other ways to reduce fat include:  Removing the skin from poultry.  Removing all visible fats from meats.  Steaming vegetables in water or broth. Meal planning  At meals, imagine dividing your plate into fourths: ? Fill one-half of your plate with vegetables and green salads. ? Fill one-fourth of your plate with whole grains. ? Fill one-fourth of your plate with lean protein foods.  Eat 4-5 servings of vegetables per day. One serving equals 1 cup raw or cooked vegetable, or 2 cups raw leafy greens.  Eat 4-5 servings of fruit per day. One serving equals 1 medium whole fruit,  cup dried fruit,  cup fresh, frozen, or canned fruit, or  cup 100% fruit juice.  Eat more foods that contain soluble fiber. Examples include apples, broccoli, carrots, beans, peas, and barley. Aim to get 25-30 g of fiber per day.  Increase your consumption of legumes, nuts, and seeds to 4-5 servings per week. One serving of dried beans or legumes equals  cup  cooked, 1 serving of nuts is  cup, and 1 serving of seeds equals 1 tablespoon.   Fats  Choose healthy fats more often. Choose monounsaturated and polyunsaturated fats, such as olive and canola oils, flaxseeds, walnuts, almonds, and seeds.  Eat more omega-3 fats. Choose salmon, mackerel, sardines, tuna, flaxseed oil, and ground flaxseeds. Aim to eat fish at least 2 times each week.  Check  food labels carefully to identify foods with trans fats or high amounts of saturated fat.  Limit saturated fats. These are found in animal products, such as meats, butter, and cream. Plant sources of saturated fats include palm oil, palm kernel oil, and coconut oil.  Avoid foods with partially hydrogenated oils in them. These contain trans fats. Examples are stick margarine, some tub margarines, cookies, crackers, and other baked goods.  Avoid fried foods. General information  Eat more home-cooked food and less restaurant, buffet, and fast food.  Limit or avoid alcohol.  Limit foods that are high in starch and sugar.  Lose weight if you are overweight. Losing just 5-10% of your body weight can help your overall health and prevent diseases such as diabetes and heart disease.  Monitor your salt (sodium) intake, especially if you have high blood pressure. Talk with your health care provider about your sodium intake.  Try to incorporate more vegetarian meals weekly. What foods can I eat? Fruits All fresh, canned (in natural juice), or frozen fruits. Vegetables Fresh or frozen vegetables (raw, steamed, roasted, or grilled). Green salads. Grains Most grains. Choose whole wheat and whole grains most of the time. Rice and pasta, including brown rice and pastas made with whole wheat. Meats and other proteins Lean, well-trimmed beef, veal, pork, and lamb. Chicken and [place] without skin. All fish and shellfish. Wild duck, rabbit, pheasant, and venison. Egg whites or low-cholesterol egg substitutes. Dried beans, peas, lentils, and tofu. Seeds and most nuts. Dairy Low-fat or nonfat cheeses, including ricotta and mozzarella. Skim or 1% milk (liquid, powdered, or evaporated). Buttermilk made with low-fat milk. Nonfat or low-fat yogurt. Fats and oils Non-hydrogenated (trans-free) margarines. Vegetable oils, including soybean, sesame, sunflower, olive, peanut, safflower, corn, canola, and cottonseed.  Salad dressings or mayonnaise made with a vegetable oil. Beverages Water (mineral or sparkling). Coffee and tea. Diet carbonated beverages. Sweets and desserts Sherbet, gelatin, and fruit ice. Small amounts of dark chocolate. Limit all sweets and desserts. Seasonings and condiments All seasonings and condiments. The items listed above may not be a complete list of foods and beverages you can eat. Contact a dietitian for more options. What foods are not recommended? Fruits Canned fruit in heavy syrup. Fruit in cream or butter sauce. Fried fruit. Limit coconut. Vegetables Vegetables cooked in cheese, cream, or butter sauce. Fried vegetables. Grains Breads made with saturated or trans fats, oils, or whole milk. Croissants. Sweet rolls. Donuts. High-fat crackers, such as cheese crackers. Meats and other proteins Fatty meats, such as hot dogs, ribs, sausage, bacon, rib-eye roast or steak. High-fat deli meats, such as salami and bologna. Caviar. Domestic duck and goose. Organ meats, such as liver. Dairy Cream, sour cream, cream cheese, and creamed cottage cheese. Whole milk cheeses. Whole or 2% milk (liquid, evaporated, or condensed). Whole buttermilk. Cream sauce or high-fat cheese sauce. Whole-milk yogurt. Fats and oils Meat fat, or shortening. Cocoa butter, hydrogenated oils, palm oil, coconut oil, palm kernel oil. Solid fats and shortenings, including bacon fat, salt pork, lard, and butter. Nondairy cream substitutes. Salad dressings with cheese or sour cream.  Beverages Regular sodas and any drinks with added sugar. Sweets and desserts Frosting. Pudding. Cookies. Cakes. Pies. Milk chocolate or white chocolate. Buttered syrups. Full-fat ice cream or ice cream drinks. The items listed above may not be a complete list of foods and beverages to avoid. Contact a dietitian for more information. Summary  Heart-healthy meal planning includes limiting unhealthy fats, increasing healthy fats, and  making other diet and lifestyle changes.  Lose weight if you are overweight. Losing just 5-10% of your body weight can help your overall health and prevent diseases such as diabetes and heart disease.  Focus on eating a balance of foods, including fruits and vegetables, low-fat or nonfat dairy, lean protein, nuts and legumes, whole grains, and heart-healthy oils and fats. This information is not intended to replace advice given to you by your health care provider. Make sure you discuss any questions you have with your health care provider. Document Revised: 01/11/2018 Document Reviewed: 01/11/2018 Elsevier Patient Education  2021 [employer].

## 2021-04-20 NOTE — Addendum Note (Signed)
Addended by: Patterson Hammersmith A on: 04/20/2021 01:20 PM   Modules accepted: Orders

## 2021-06-11 ENCOUNTER — Other Ambulatory Visit: Payer: Self-pay | Admitting: Internal Medicine

## 2021-06-30 ENCOUNTER — Encounter (HOSPITAL_BASED_OUTPATIENT_CLINIC_OR_DEPARTMENT_OTHER): Payer: Self-pay | Admitting: Obstetrics & Gynecology

## 2021-07-01 ENCOUNTER — Encounter (HOSPITAL_BASED_OUTPATIENT_CLINIC_OR_DEPARTMENT_OTHER): Payer: Self-pay

## 2021-07-01 ENCOUNTER — Encounter (HOSPITAL_BASED_OUTPATIENT_CLINIC_OR_DEPARTMENT_OTHER): Payer: Self-pay | Admitting: Obstetrics & Gynecology

## 2021-08-31 ENCOUNTER — Other Ambulatory Visit: Payer: Self-pay | Admitting: Internal Medicine

## 2021-09-08 ENCOUNTER — Other Ambulatory Visit (HOSPITAL_COMMUNITY)
Admission: RE | Admit: 2021-09-08 | Discharge: 2021-09-08 | Disposition: A | Discharge status: Home / Self Care | Payer: Medicare Other | Source: Ambulatory Visit | Attending: Obstetrics & Gynecology | Admitting: Obstetrics & Gynecology

## 2021-09-08 ENCOUNTER — Ambulatory Visit (INDEPENDENT_AMBULATORY_CARE_PROVIDER_SITE_OTHER): Payer: Medicare Other | Admitting: Obstetrics & Gynecology

## 2021-09-08 ENCOUNTER — Encounter (HOSPITAL_BASED_OUTPATIENT_CLINIC_OR_DEPARTMENT_OTHER): Payer: Self-pay | Admitting: Obstetrics & Gynecology

## 2021-09-08 ENCOUNTER — Other Ambulatory Visit: Payer: Self-pay

## 2021-09-08 VITALS — BP 152/70 | HR 74 | Ht 65.5 in | Wt 142.0 lb

## 2021-09-08 DIAGNOSIS — I10 Essential (primary) hypertension: Secondary | ICD-10-CM

## 2021-09-08 DIAGNOSIS — R140 Abdominal distension (gaseous): Secondary | ICD-10-CM | POA: Diagnosis not present

## 2021-09-08 DIAGNOSIS — Z780 Asymptomatic menopausal state: Secondary | ICD-10-CM

## 2021-09-08 DIAGNOSIS — Z124 Encounter for screening for malignant neoplasm of cervix: Secondary | ICD-10-CM | POA: Insufficient documentation

## 2021-09-08 DIAGNOSIS — Z01419 Encounter for gynecological examination (general) (routine) without abnormal findings: Secondary | ICD-10-CM | POA: Diagnosis not present

## 2021-09-08 NOTE — Progress Notes (Signed)
65 y.o. G2P2002 Married White or Caucasian female here for breast and pelvic exam.  BMD was done 06/2021.  I am following this.  Has osteopenia.  Calcium discussed today as well as Vit D.  Will plan to repeat in 2 years.  Denies vaginal bleeding.  She reports some increased flatulence and burping.  She is having a lot more bloating as well.  Feels this worsened with Zetia.  She's off this now.  Asked for suggestions.  Probiotic suggested for one month.  Also, recommended considering elimination diet.  May need GI follow up.   Continues to report decreased libido.  We have discussed this in the past.  Would like suggestions for where to purchase some products.  Has experienced a few weeks of hot flashes.  Not on HRT.  Not sure if this is anything that needs.  Has been on oral steroids for shin splints.    Patient's last menstrual period was 06/17/2009 (approximate).          Sexually active: Yes.    H/O STD:  no  Health Maintenance: PCP:  Dr. Quay Burow.  Last wellness appt was 02/2021.  Did blood work at that appt: yes.  Had elevated glucose with hb a1c of 5.6. Has follow up scheduled 09/2021 Vaccines are up to date:  no, has not done  Colonoscopy:  12/10/2015, follow up 10 years MMG:  06/29/2021 Normal BMD:  06/29/2021 Last pap smear:  08/20/2020 Negative.   H/o abnormal pap smear:  no   reports that she has never smoked. She has never used smokeless tobacco. She reports that she does not drink alcohol and does not use drugs.  Past Medical History:  Diagnosis Date   Cardiospasm 1983   felt may be related to MVP   Hyperlipidemia    Hypertension    Kidney cysts 11/2015   cyst on one of her kidneys, no treatment required   Mitral valve prolapse    PONV (postoperative nausea and vomiting)    teeth chipped   Raynaud disease    Retinal hole of right eye    Shingles 2012   leg   Tachycardia     Past Surgical History:  Procedure Laterality Date   COLONOSCOPY  2007   negatve,  [place] ,[place] N/A 08/29/2016   Procedure: DILATATION & CURETTAGE/HYSTEROSCOPY;  Surgeon: Salvadore Dom, MD;  Location: [facility];  Service: Gynecology;  Laterality: N/A;   DILATION AND CURETTAGE OF UTERUS     EXCISION OF SKIN TAG Left 08/29/2016   Procedure: EXCISION OF SKIN TAG left labia majora;  Surgeon: Salvadore Dom, MD;  Location: [facility];  Service: Gynecology;  Laterality: Left;   EXPLORATORY LAPAROTOMY     X2 for pain: fibroid, enlarged oviduct   RETINAL LASER PROCEDURE Right 08/2015   SALPINGECTOMY  2007 or 2008   and fibroid removed   TUBAL LIGATION      Current Outpatient Medications  Medication Sig Dispense Refill   ALPRAZolam (XANAX) 0.5 MG tablet TAKE ONE TABLET BY MOUTH EVERY 8 HOURS AS NEEDED FOR ANXIETY 15 tablet 0   Cholecalciferol (VITAMIN D) 2000 units CAPS Take 2,000 Units by mouth daily.     diltiazem (CARDIZEM CD) 120 MG 24 hr capsule Take 1 capsule (120 mg total) by mouth daily. 90 capsule 3   estradiol (ESTRACE VAGINAL) 0.1 MG/GM vaginal cream 1 gram pv twice weekly. 42.5 g 4   fluticasone (FLONASE) 50 MCG/ACT nasal spray  Place 1 spray into both nostrils daily. PRN     hydrocortisone (ANUSOL-HC) 2.5 % rectal cream Place 1 application rectally 2 (two) times daily. 30 g 0   levalbuterol (XOPENEX HFA) 45 MCG/ACT inhaler Inhale 1 puff into the lungs every 4 (four) hours as needed for wheezing or shortness of breath. 1 each 0   loperamide (IMODIUM) 2 MG capsule Take 1 capsule (2 mg total) by mouth 2 (two) times daily as needed for diarrhea or loose stools. 10 capsule 0   Loteprednol Etabonate (LOTEMAX OP) Apply to eye as needed.      metoprolol succinate (TOPROL-XL) 25 MG 24 hr tablet TAKE 1 TABLET BY MOUTH IN THE MORNING AND TAKE 1 TABLET AT BEDTIME 180 tablet 3   promethazine (PHENERGAN) 25 MG suppository Place 1 suppository (25 mg total) rectally every 6 (six) hours as needed for nausea or vomiting. 5 each 1    rosuvastatin (CRESTOR) 5 MG tablet TAKE ONE TABLET BY MOUTH DAILY 90 tablet 3   No current facility-administered medications for this visit.    Family History  Problem Relation Age of Onset   Heart attack Father 76   Myasthenia gravis Father    Hypertension Father    Hyperlipidemia Father    Osteoporosis Father        due to steroids for Myasthenia Grvis   Leukemia Mother 51   Hyperlipidemia Mother    Hypertension Mother    Hypothyroidism Mother    Prostate cancer Brother        in 31s   Hyperlipidemia Brother    Stroke Maternal Grandfather        in 73s   Healthy Son    Healthy Daughter    Diabetes Neg Hx     Review of Systems  Constitutional:        Hot flashes   Exam:   BP (!) 152/70 (BP Location: Right Arm, Patient Position: Sitting, Cuff Size: Small)   Pulse 74   Ht 5' 5.5" (1.664 m)   Wt 142 lb (64.4 kg)   LMP 06/17/2009 (Approximate)   BMI 23.27 kg/m   Height: 5' 5.5" (166.4 cm)  General appearance: alert, cooperative and appears stated age Breasts: normal appearance, no masses or tenderness Abdomen: soft, non-tender; bowel sounds normal; no masses,  no organomegaly Lymph nodes: Cervical, supraclavicular, and axillary nodes normal.  No abnormal inguinal nodes palpated Neurologic: Grossly normal  Pelvic: External genitalia:  no lesions              Urethra:  normal appearing urethra with no masses, tenderness or lesions              Bartholins and Skenes: normal                 Vagina: normal appearing vagina with atrophic changes and no discharge, no lesions              Cervix: no lesions              Pap taken: Yes.   Bimanual Exam:  Uterus:  normal size, contour, position, consistency, mobility, non-tender              Adnexa: normal adnexa and no mass, fullness, tenderness               Rectovaginal: Confirms               Anus:  normal sphincter tone, no lesions  Chaperone, Cyndi Bender, CMA, was  present for exam.  Assessment/Plan: 1. Encntr for  gyn exam (general) (routine) w/o abn findings - pap smear obtained.  Pt desires yearly.  Guidelines reviewed.   - MMG 06/2021 - BMD 06/2021 - Colonoscopy 11/2015, follow up 10 years - care gap reviewed/updated   2. Postmenopausal - does not need RF for estradiol cream at this time  3. Primary hypertension  4. Bloating/Burping/Flatulence - probiotic suggested.  Suggested elimination diet.  Suggested GI follow up

## 2021-09-12 LAB — CYTOLOGY - PAP: Diagnosis: NEGATIVE

## 2021-09-19 NOTE — Telephone Encounter (Signed)
Set patient up for fasting lipids, CMET, amylase, lipase, CBC, ESR, bilirubin

## 2021-09-20 ENCOUNTER — Other Ambulatory Visit: Payer: Medicare Other | Admitting: *Deleted

## 2021-09-20 ENCOUNTER — Other Ambulatory Visit: Payer: Self-pay

## 2021-09-20 ENCOUNTER — Other Ambulatory Visit: Payer: Self-pay | Admitting: Internal Medicine

## 2021-09-20 DIAGNOSIS — R143 Flatulence: Secondary | ICD-10-CM

## 2021-09-20 DIAGNOSIS — R002 Palpitations: Secondary | ICD-10-CM

## 2021-09-20 DIAGNOSIS — R0602 Shortness of breath: Secondary | ICD-10-CM

## 2021-09-20 DIAGNOSIS — K30 Functional dyspepsia: Secondary | ICD-10-CM

## 2021-09-20 DIAGNOSIS — E785 Hyperlipidemia, unspecified: Secondary | ICD-10-CM

## 2021-09-20 DIAGNOSIS — E782 Mixed hyperlipidemia: Secondary | ICD-10-CM

## 2021-09-20 DIAGNOSIS — Z79899 Other long term (current) drug therapy: Secondary | ICD-10-CM

## 2021-09-20 LAB — BASIC METABOLIC PANEL
BUN/Creatinine Ratio: 15 (ref 12–28)
BUN: 12 mg/dL (ref 8–27)
CO2: 24 mmol/L (ref 20–29)
Calcium: 9.8 mg/dL (ref 8.7–10.3)
Chloride: 103 mmol/L (ref 96–106)
Creatinine, Ser: 0.82 mg/dL (ref 0.57–1.00)
Glucose: 85 mg/dL (ref 70–99)
Potassium: 4.3 mmol/L (ref 3.5–5.2)
Sodium: 140 mmol/L (ref 134–144)
eGFR: 79 mL/min/{1.73_m2} (ref >59)

## 2021-09-20 LAB — LIPID PANEL
Chol/HDL Ratio: 2.6 ratio (ref 0.0–4.4)
Cholesterol, Total: 198 mg/dL (ref 100–199)
HDL: 76 mg/dL (ref >39)
LDL Chol Calc (NIH): 109 mg/dL — ABNORMAL HIGH (ref 0–99)
Triglycerides: 72 mg/dL (ref 0–149)
VLDL Cholesterol Cal: 13 mg/dL (ref 5–40)

## 2021-09-20 LAB — HEMOGLOBIN A1C
Est. average glucose Bld gHb Est-mCnc: 111 mg/dL
Hgb A1c MFr Bld: 5.5 % (ref 4.8–5.6)

## 2021-09-20 NOTE — Telephone Encounter (Signed)
Called lab to see if additional labs could be ran off blood that was collected today. I was told probably not.  I called pt and she is agreeable to come back in to have additional labs drawn.  Orders placed per Dr. Harrington Challenger message  from 09/19/21 "Set patient up for fasting lipids, CMET, amylase, lipase, CBC, ESR, bilirubin."

## 2021-09-20 NOTE — Addendum Note (Signed)
Addended by: Precious Gilding on: 09/20/2021 09:35 AM   Modules accepted: Orders

## 2021-09-22 LAB — BILIRUBIN, TOTAL: Bilirubin Total: 0.7 mg/dL (ref 0.0–1.2)

## 2021-09-22 LAB — CBC
Hematocrit: 41.9 % (ref 34.0–46.6)
Hemoglobin: 14.1 g/dL (ref 11.1–15.9)
MCH: 30.3 pg (ref 26.6–33.0)
MCHC: 33.7 g/dL (ref 31.5–35.7)
MCV: 90 fL (ref 79–97)
Platelets: 204 10*3/uL (ref 150–450)
RBC: 4.66 x10E6/uL (ref 3.77–5.28)
RDW: 12.8 % (ref 11.7–15.4)
WBC: 6 10*3/uL (ref 3.4–10.8)

## 2021-09-22 LAB — LIPASE: Lipase: 42 U/L (ref 14–72)

## 2021-09-22 LAB — AMYLASE: Amylase: 105 U/L (ref 31–110)

## 2021-09-22 LAB — SEDIMENTATION RATE: Sed Rate: 4 mm/hr (ref 0–40)

## 2021-09-26 DIAGNOSIS — E785 Hyperlipidemia, unspecified: Secondary | ICD-10-CM

## 2021-09-26 DIAGNOSIS — E782 Mixed hyperlipidemia: Secondary | ICD-10-CM

## 2021-09-26 DIAGNOSIS — E559 Vitamin D deficiency, unspecified: Secondary | ICD-10-CM

## 2021-09-26 DIAGNOSIS — Z79899 Other long term (current) drug therapy: Secondary | ICD-10-CM

## 2021-09-26 NOTE — Telephone Encounter (Signed)
The pt;s LDL was better last year 30.   Something is different   ? Diet   ? Missed doses I would recomm increasing to 10 mg    F/U lipomed with Lp(a), APo B in 8 wks     Goal is below 77 for LDL

## 2021-09-27 MED ORDER — ROSUVASTATIN CALCIUM 10 MG PO TABS: 10.0000 mg | ORAL_TABLET | Freq: Every day | ORAL | 3 refills | Status: DC

## 2021-10-30 ENCOUNTER — Other Ambulatory Visit: Payer: Self-pay | Admitting: Internal Medicine

## 2021-11-23 ENCOUNTER — Other Ambulatory Visit: Payer: Self-pay

## 2021-11-23 ENCOUNTER — Other Ambulatory Visit: Payer: Medicare Other | Admitting: *Deleted

## 2021-11-23 DIAGNOSIS — E785 Hyperlipidemia, unspecified: Secondary | ICD-10-CM

## 2021-11-23 DIAGNOSIS — E782 Mixed hyperlipidemia: Secondary | ICD-10-CM

## 2021-11-23 DIAGNOSIS — Z79899 Other long term (current) drug therapy: Secondary | ICD-10-CM

## 2021-11-24 LAB — NMR, LIPOPROFILE
Cholesterol, Total: 189 mg/dL (ref 100–199)
HDL Particle Number: 39.1 umol/L (ref >=30.5)
HDL-C: 80 mg/dL (ref >39)
LDL Particle Number: 985 nmol/L (ref <1000)
LDL Size: 21.2 nm (ref >20.5)
LDL-C (NIH Calc): 96 mg/dL (ref 0–99)
LP-IR Score: 25 (ref <=45)
Small LDL Particle Number: 309 nmol/L (ref <=527)
Triglycerides: 73 mg/dL (ref 0–149)

## 2021-11-24 LAB — LIPOPROTEIN A (LPA): Lipoprotein (a): 147.1 nmol/L — ABNORMAL HIGH (ref <75.0)

## 2021-11-24 LAB — APOLIPOPROTEIN B: Apolipoprotein B: 79 mg/dL (ref <90)

## 2021-11-25 ENCOUNTER — Encounter: Payer: Self-pay | Admitting: Internal Medicine

## 2021-11-29 ENCOUNTER — Telehealth: Payer: Self-pay

## 2021-11-29 DIAGNOSIS — Z79899 Other long term (current) drug therapy: Secondary | ICD-10-CM

## 2021-11-29 DIAGNOSIS — E785 Hyperlipidemia, unspecified: Secondary | ICD-10-CM

## 2021-11-29 NOTE — Telephone Encounter (Signed)
Pt is on Crestor 10 mg daily for about 6 weeks.. she says she was on Zetia but stopped it some time ago due to GI upset which has improved some since going off of it but still has some problems so not sure if it was the Zetia.   I will send to Dr. Harrington Challenger for review and recommendations.

## 2021-11-29 NOTE — Telephone Encounter (Signed)
What is she still taking?  Zetia,  Crestor?

## 2021-11-29 NOTE — Telephone Encounter (Signed)
-----   Message from Dorris Carnes V, MD sent at 11/25/2021  3:46 PM EST ----- LDL is 96  Optimally would be closer to 70  COnfirm what pt is taking   ? Crestor 5    Would she be willing to try Crestor 10 mg  Follow up lipids in 8 wks

## 2021-12-01 NOTE — Telephone Encounter (Signed)
Patient is only taking Crestor 10 mg daily

## 2021-12-02 ENCOUNTER — Encounter: Payer: Self-pay | Admitting: Internal Medicine

## 2021-12-02 MED ORDER — ROSUVASTATIN CALCIUM 20 MG PO TABS: 20.0000 mg | ORAL_TABLET | Freq: Every day | ORAL | 3 refills | Status: DC

## 2021-12-02 NOTE — Telephone Encounter (Signed)
Pt aware of new prescription.  Schedule lab day.  Will forward to covering nurse to enter order for needed lab work and attach to current lab day.

## 2021-12-02 NOTE — Telephone Encounter (Signed)
Recomm trying to increase Crestor to 10 mg   F/U lipomed in 8 wks

## 2021-12-02 NOTE — Telephone Encounter (Signed)
Sorry   I meant increase Crestor to 20 mg  Follow up lipomed in 8 wks

## 2021-12-07 NOTE — Telephone Encounter (Signed)
Order placed for 8 week Lipomed.

## 2022-01-05 ENCOUNTER — Encounter: Payer: Self-pay | Admitting: Internal Medicine

## 2022-01-18 ENCOUNTER — Encounter: Payer: Self-pay | Admitting: Internal Medicine

## 2022-01-18 ENCOUNTER — Other Ambulatory Visit: Payer: Self-pay

## 2022-01-18 ENCOUNTER — Ambulatory Visit (INDEPENDENT_AMBULATORY_CARE_PROVIDER_SITE_OTHER): Payer: Medicare Other | Admitting: Internal Medicine

## 2022-01-18 VITALS — BP 162/78 | HR 70 | Ht 65.5 in | Wt 148.0 lb

## 2022-01-18 DIAGNOSIS — I10 Essential (primary) hypertension: Secondary | ICD-10-CM

## 2022-01-18 NOTE — Patient Instructions (Signed)
Medication Instructions:  Your physician recommends that you continue on your current medications as directed. Please refer to the Current Medication list given to you today.  *If you need a refill on your cardiac medications before your next appointment, please call your pharmacy*   Lab Work: none If you have labs (blood work) drawn today and your tests are completely normal, you will receive your results only by: [place] (if you have MyChart) OR A paper copy in the mail If you have any lab test that is abnormal or we need to change your treatment, we will call you to review the results.   Testing/Procedures: none   Follow-Up: At [facility], you and your health needs are our priority.  As part of our continuing mission to provide you with exceptional heart care, we have created designated Provider Care Teams.  These Care Teams include your primary Cardiologist (physician) and Advanced Practice Providers (APPs -  Physician Assistants and Nurse Practitioners) who all work together to provide you with the care you need, when you need it.  We recommend signing up for the patient portal called "MyChart".  Sign up information is provided on this After Visit Summary.  MyChart is used to connect with patients for Virtual Visits (Telemedicine).  Patients are able to view lab/test results, encounter notes, upcoming appointments, etc.  Non-urgent messages can be sent to your provider as well.   To learn more about what you can do with MyChart, go to [email or web address].    Your next appointment:   6 month(s)  The format for your next appointment:   In Person  Provider:   Dorris Carnes, MD     Other Instructions  Call us or send [place] your BP readings in 1 month or so

## 2022-01-18 NOTE — Progress Notes (Signed)
Cardiology Office Note   Date:  01/18/2022   ID:  Tiffany Moran, DOB 1956/01/05, MRN [id number]  PCP:  Binnie Rail, MD  Cardiologist:   Dorris Carnes, MD   F/U of palpitations and mild CAD    History of Present Illness: Tiffany Moran is a 66 y.o. female with a history of palpitations  Also a history of abnormal myovue Cardiac CT showed mild nonobstructive CAD (2016)  The pt has had event monitor in the past for palpitationns     THis showed SR with short bursts of SVT and NSVT   I saw the patient in May 2022  Last week she did not feel good   Tues had a headache   Thurday woke up with a HA   BP labile   High at times  (160 peak)  She would take meds and a Xanax and BP would come down    She denies CP   No palpitations  Says she wakes up and her hand are asleep   Also L foot goes to sleep when sitting        Outpatient Medications Prior to Visit  Medication Sig Dispense Refill   ALPRAZolam (XANAX) 0.5 MG tablet TAKE ONE TABLET BY MOUTH EVERY 8 HOURS AS NEEDED FOR ANXIETY 15 tablet 0   Cholecalciferol (VITAMIN D) 2000 units CAPS Take 2,000 Units by mouth daily.     diltiazem (DILACOR XR) 120 MG 24 hr capsule TAKE 1 CAPSULE BY MOUTH DAILY 90 capsule 3   estradiol (ESTRACE VAGINAL) 0.1 MG/GM vaginal cream 1 gram pv twice weekly. 42.5 g 4   fluticasone (FLONASE) 50 MCG/ACT nasal spray Place 1 spray into both nostrils daily. PRN     hydrocortisone (ANUSOL-HC) 2.5 % rectal cream Place 1 application rectally 2 (two) times daily. 30 g 0   levalbuterol (XOPENEX HFA) 45 MCG/ACT inhaler Inhale 1 puff into the lungs every 4 (four) hours as needed for wheezing or shortness of breath. 1 each 0   loperamide (IMODIUM) 2 MG capsule Take 1 capsule (2 mg total) by mouth 2 (two) times daily as needed for diarrhea or loose stools. 10 capsule 0   Loteprednol Etabonate (LOTEMAX OP) Apply to eye as needed.      metoprolol succinate (TOPROL-XL) 25 MG 24 hr tablet TAKE 1 TABLET BY MOUTH IN THE MORNING AND TAKE  1 TABLET AT BEDTIME 180 tablet 3   promethazine (PHENERGAN) 25 MG suppository Place 1 suppository (25 mg total) rectally every 6 (six) hours as needed for nausea or vomiting. 5 each 1   rosuvastatin (CRESTOR) 20 MG tablet Take 1 tablet (20 mg total) by mouth daily. 90 tablet 3   No facility-administered medications prior to visit.     Allergies:   Lipitor [atorvastatin]   Past Medical History:  Diagnosis Date   Cardiospasm 1983   felt may be related to MVP   Hyperlipidemia    Hypertension    Kidney cysts 11/2015   cyst on one of her kidneys, no treatment required   Mitral valve prolapse    PONV (postoperative nausea and vomiting)    teeth chipped   Raynaud disease    Retinal hole of right eye    Shingles 2012   leg   Tachycardia     Past Surgical History:  Procedure Laterality Date   COLONOSCOPY  2007   negatve, [place] ,[place] N/A 08/29/2016   Procedure: DILATATION & CURETTAGE/HYSTEROSCOPY;  Surgeon:  Salvadore Dom, MD;  Location: [facility];  Service: Gynecology;  Laterality: N/A;   DILATION AND CURETTAGE OF UTERUS     EXCISION OF SKIN TAG Left 08/29/2016   Procedure: EXCISION OF SKIN TAG left labia majora;  Surgeon: Salvadore Dom, MD;  Location: [facility];  Service: Gynecology;  Laterality: Left;   EXPLORATORY LAPAROTOMY     X2 for pain: fibroid, enlarged oviduct   RETINAL LASER PROCEDURE Right 08/2015   SALPINGECTOMY  2007 or 2008   and fibroid removed   TUBAL LIGATION       Social History:  The patient  reports that she has never smoked. She has never used smokeless tobacco. She reports that she does not drink alcohol and does not use drugs.   Family History:  The patient's family history includes Healthy in her daughter and son; Heart attack (age of onset: 72) in her father; Hyperlipidemia in her brother, father, and mother; Hypertension in her father and mother; Hypothyroidism in her mother; Leukemia (age of  onset: 49) in her mother; Myasthenia gravis in her father; Osteoporosis in her father; Prostate cancer in her brother; Stroke in her maternal grandfather.    ROS:  Please see the history of present illness. All other systems are reviewed and  Negative to the above problem except as noted.    PHYSICAL EXAM: VS:  BP (!) 162/78    Pulse 70    Ht 5' 5.5" (1.664 m)    Wt 148 lb (67.1 kg)    LMP 06/17/2009 (Approximate)    SpO2 94%    BMI 24.25 kg/m   GEN: Well nourished, well developed, in no acute distress  HEENT: normal  Neck: JVP is norrmal Cardiac: RRR; no murmurs   No LE edema  Respiratory:  clear to auscultation bilaterally,  GI: soft, nontender, nondistended   MS: no deformity Moving all extremities   Skin: warm and dry, Erythmatous lesions toes  Neuro:  Strength and sensation are intact Psych: euthymic mood, full affect   EKG:  EKG is not done    Lipid Panel    Component Value Date/Time   CHOL 198 09/20/2021 0728   TRIG 72 09/20/2021 0728   HDL 76 09/20/2021 0728   CHOLHDL 2.6 09/20/2021 0728   CHOLHDL 2 03/10/2021 0926   VLDL 16.6 03/10/2021 0926   LDLCALC 109 (H) 09/20/2021 0728    ECHO 02/18/19   1. The left ventricle has normal systolic function with an ejection  fraction of 60-65%. The cavity size was normal. Left ventricular diastolic  parameters were normal No evidence of left ventricular regional wall  motion abnormalities.   2. The right ventricle has normal systolic function. The cavity size was  normal. There is no increase in right ventricular wall thickness. Right  ventricular systolic pressure is normal with an estimated pressure of 29  mmHg.   3. Mild myxomatous mitral valve prolapse.   4. Mild late systolic mitral insufficiency with a centrally-directed jet.   5. The average left ventricular global longitudinal strain is -21.6 %.   6. The aortic valve is normal in structure.   CT coronary agniogram 2016  Aorta:  Normal caliber, no calcifications, no  dissection.   Aortic Valve:  Trileaflet, no calcifications.   Coronary Arteries: Originating in normal position. Right dominance.   Left main is a large vessel that gives rise to LAS, ramus intermedius and LCX arteries.   LAD is a large caliber vessel that wraps around the apex  and gives rise to one diagonal branch. There is a mild calcified plaque in the proximal LAD associated with 25-50% stenosis, the rest of LAD/D1 is free of plaque.   RI is medium caliber vessel free of plaque.   LCX artery is a medium caliber non-dominant vessel that gives rise to one rather small obtuse marginal branch. There is no plaque.   RCA is a large caliber vessel that gives rise to PDA and PLA. There is minimal non-obstructive plaque.   IMPRESSION: 1. Coronary calcium score of 8. This was 59 percentile for age and sex matched control.   2. Normal coronary origin.  Right dominance.   3. Mild non-obstructive calcified plaque in the proximal LAD. An aggressive risk factor modification is recommended.               Wt Readings from Last 3 Encounters:  01/18/22 148 lb (67.1 kg)  09/08/21 142 lb (64.4 kg)  04/18/21 143 lb 12.8 oz (65.2 kg)      ASSESSMENT AND PLAN:   1  Blood pressure   Pt's BP is very labile   She gets very anxious when checking, worried it will be high, even when she is taking it.   I have recomm that she take metoprolol bid and to take diltiazem in the am (she is taking at noon)   Email in BP log  2 Palpitations No significant spells   3 CAD  Mild nonobstructive plaque on CT coronary angiogram in 2016.No CP     4 dyslipidemia Pt on Crestor  She did nit tolerate Zetia    LDL in Dec 96  HDL 80  Follow for now      Follow up timing based on BP readings     Signed, Dorris Carnes, MD  01/18/2022 [facility] [place], [place],   [address] Phone: [phone]; Fax: [phone]

## 2022-02-02 ENCOUNTER — Other Ambulatory Visit: Payer: Medicare Other

## 2022-02-07 ENCOUNTER — Other Ambulatory Visit: Payer: Medicare Other | Admitting: *Deleted

## 2022-02-07 ENCOUNTER — Other Ambulatory Visit: Payer: Self-pay

## 2022-02-08 LAB — NMR, LIPOPROFILE

## 2022-02-09 ENCOUNTER — Other Ambulatory Visit: Payer: Self-pay | Admitting: *Deleted

## 2022-02-09 ENCOUNTER — Encounter: Payer: Self-pay | Admitting: Internal Medicine

## 2022-02-09 ENCOUNTER — Telehealth (INDEPENDENT_AMBULATORY_CARE_PROVIDER_SITE_OTHER): Payer: Medicare Other | Admitting: Internal Medicine

## 2022-02-09 ENCOUNTER — Telehealth: Payer: Self-pay | Admitting: Internal Medicine

## 2022-02-09 ENCOUNTER — Ambulatory Visit: Payer: Self-pay

## 2022-02-09 VITALS — Wt 145.0 lb

## 2022-02-09 DIAGNOSIS — U071 COVID-19: Secondary | ICD-10-CM | POA: Diagnosis not present

## 2022-02-09 DIAGNOSIS — E785 Hyperlipidemia, unspecified: Secondary | ICD-10-CM

## 2022-02-09 LAB — NMR, LIPOPROFILE

## 2022-02-09 LAB — APOLIPOPROTEIN B: Apolipoprotein B: 68 mg/dL (ref <90)

## 2022-02-09 LAB — LIPOPROTEIN A (LPA): Lipoprotein (a): 144.6 nmol/L — ABNORMAL HIGH (ref <75.0)

## 2022-02-09 MED ORDER — NIRMATRELVIR/RITONAVIR (PAXLOVID)TABLET: 3.0000 | ORAL_TABLET | Freq: Two times a day (BID) | ORAL | 0 refills | Status: AC

## 2022-02-09 MED ORDER — PROMETHAZINE HCL 25 MG RE SUPP: 25.0000 mg | Freq: Four times a day (QID) | RECTAL | 1 refills | Status: DC | PRN

## 2022-02-09 NOTE — Progress Notes (Signed)
Virtual Visit via Video Note  I connected with Tiffany Moran on 02/09/22 at  8:30 AM EST by a video enabled telemedicine application and verified that I am speaking with the correct person using two identifiers.  Location patient: home Location provider: work office Persons participating in the virtual visit: patient, provider  I discussed the limitations of evaluation and management by telemedicine and the availability of in person appointments. The patient expressed understanding and agreed to proceed.   HPI: She has scheduled this visit to inform us that she has tested positive for COVID today.  For the past 24 hours she has been having headache, postnasal drip, congestion, vomiting, chills and subjective fevers.  Her husband had COVID last week.  Because of her emesis she is requesting a refill of Phenergan suppositories.   ROS: Constitutional: Positive for fever, chills, appetite change and fatigue.  HEENT: Denies photophobia, eye pain, redness,  trouble swallowing, neck pain, neck stiffness and tinnitus.   Respiratory: Denies SOB, DOE,  chest tightness,  and wheezing.   Cardiovascular: Denies chest pain, palpitations and leg swelling.  Gastrointestinal: Denies  abdominal pain, diarrhea, constipation, blood in stool and abdominal distention.  Genitourinary: Denies dysuria, urgency, frequency, hematuria, flank pain and difficulty urinating.  Endocrine: Denies: hot or cold intolerance, sweats, changes in hair or nails, polyuria, polydipsia. Musculoskeletal: Denies myalgias, back pain, joint swelling, arthralgias and gait problem.  Skin: Denies pallor, rash and wound.  Neurological: Denies dizziness, seizures, syncope, weakness, light-headedness, numbness and headaches.  Hematological: Denies adenopathy. Easy bruising, personal or family bleeding history  Psychiatric/Behavioral: Denies suicidal ideation, mood changes, confusion, nervousness, sleep disturbance and agitation   Past  Medical History:  Diagnosis Date   Cardiospasm 1983   felt may be related to MVP   Hyperlipidemia    Hypertension    Kidney cysts 11/2015   cyst on one of her kidneys, no treatment required   Mitral valve prolapse    PONV (postoperative nausea and vomiting)    teeth chipped   Raynaud disease    Retinal hole of right eye    Shingles 2012   leg   Tachycardia     Past Surgical History:  Procedure Laterality Date   COLONOSCOPY  2007   negatve, [place] ,[place] N/A 08/29/2016   Procedure: DILATATION & CURETTAGE/HYSTEROSCOPY;  Surgeon: Salvadore Dom, MD;  Location: [facility];  Service: Gynecology;  Laterality: N/A;   DILATION AND CURETTAGE OF UTERUS     EXCISION OF SKIN TAG Left 08/29/2016   Procedure: EXCISION OF SKIN TAG left labia majora;  Surgeon: Salvadore Dom, MD;  Location: [facility];  Service: Gynecology;  Laterality: Left;   EXPLORATORY LAPAROTOMY     X2 for pain: fibroid, enlarged oviduct   RETINAL LASER PROCEDURE Right 08/2015   SALPINGECTOMY  2007 or 2008   and fibroid removed   TUBAL LIGATION      Family History  Problem Relation Age of Onset   Heart attack Father 28   Myasthenia gravis Father    Hypertension Father    Hyperlipidemia Father    Osteoporosis Father        due to steroids for Myasthenia Grvis   Leukemia Mother 67   Hyperlipidemia Mother    Hypertension Mother    Hypothyroidism Mother    Prostate cancer Brother        in 27s   Hyperlipidemia Brother    Stroke Maternal Grandfather  in 80s   Healthy Son    Healthy Daughter    Diabetes Neg Hx     SOCIAL HX:   reports that she has never smoked. She has never used smokeless tobacco. She reports that she does not drink alcohol and does not use drugs.   Current Outpatient Medications:    ALPRAZolam (XANAX) 0.5 MG tablet, TAKE ONE TABLET BY MOUTH EVERY 8 HOURS AS NEEDED FOR ANXIETY, Disp: 15 tablet, Rfl: 0   Cholecalciferol (VITAMIN D)  2000 units CAPS, Take 2,000 Units by mouth daily., Disp: , Rfl:    diltiazem (DILACOR XR) 120 MG 24 hr capsule, TAKE 1 CAPSULE BY MOUTH DAILY, Disp: 90 capsule, Rfl: 3   estradiol (ESTRACE VAGINAL) 0.1 MG/GM vaginal cream, 1 gram pv twice weekly., Disp: 42.5 g, Rfl: 4   fluticasone (FLONASE) 50 MCG/ACT nasal spray, Place 1 spray into both nostrils daily. PRN, Disp: , Rfl:    hydrocortisone (ANUSOL-HC) 2.5 % rectal cream, Place 1 application rectally 2 (two) times daily., Disp: 30 g, Rfl: 0   levalbuterol (XOPENEX HFA) 45 MCG/ACT inhaler, Inhale 1 puff into the lungs every 4 (four) hours as needed for wheezing or shortness of breath., Disp: 1 each, Rfl: 0   loperamide (IMODIUM) 2 MG capsule, Take 1 capsule (2 mg total) by mouth 2 (two) times daily as needed for diarrhea or loose stools., Disp: 10 capsule, Rfl: 0   Loteprednol Etabonate (LOTEMAX OP), Apply to eye as needed. , Disp: , Rfl:    metoprolol succinate (TOPROL-XL) 25 MG 24 hr tablet, TAKE 1 TABLET BY MOUTH IN THE MORNING AND TAKE 1 TABLET AT BEDTIME, Disp: 180 tablet, Rfl: 3   nirmatrelvir/ritonavir EUA (PAXLOVID) 20 x 150 MG & 10 x 100MG  TABS, Take 3 tablets by mouth 2 (two) times daily for 5 days. (Take nirmatrelvir 150 mg two tablets twice daily for 5 days and ritonavir 100 mg one tablet twice daily for 5 days) Patient GFR is 79, Disp: 30 tablet, Rfl: 0   promethazine (PHENERGAN) 25 MG suppository, Place 1 suppository (25 mg total) rectally every 6 (six) hours as needed for nausea or vomiting., Disp: 5 each, Rfl: 1   rosuvastatin (CRESTOR) 20 MG tablet, Take 1 tablet (20 mg total) by mouth daily., Disp: 90 tablet, Rfl: 3  EXAM:   VITALS per patient if applicable: None reported  GENERAL: alert, oriented, appears well and in no acute distress  HEENT: atraumatic, conjunttiva clear, no obvious abnormalities on inspection of external nose and ears, wears corrective lenses  NECK: normal movements of the head and neck  LUNGS: on inspection  no signs of respiratory distress, breathing rate appears normal, no obvious gross increased work of breathing, gasping or wheezing  CV: no obvious cyanosis  MS: moves all visible extremities without noticeable abnormality  PSYCH/NEURO: pleasant and cooperative, no obvious depression or anxiety, speech and thought processing grossly intact  ASSESSMENT AND PLAN:   COVID-19  - Plan: nirmatrelvir/ritonavir EUA (PAXLOVID) 20 x 150 MG & 10 x 100MG  TABS, promethazine (PHENERGAN) 25 MG suppository -She may also use OTC medications such as antihistamines, decongestants, pain relievers, guaifenesin. -We have reviewed quarantine period of 5 days. -We have discussed symptoms that would promote ED evaluation. -She knows to follow with [place] if symptoms fail to resolve. -Has been advised to discontinue use of statin and take diltiazem every other day while on Paxlovid.      I discussed the assessment and treatment plan with the patient. The patient was  provided an opportunity to ask questions and all were answered. The patient agreed with the plan and demonstrated an understanding of the instructions.   The patient was advised to call back or seek an in-person evaluation if the symptoms worsen or if the condition fails to improve as anticipated.    Lelon Frohlich, MD  [facility]

## 2022-02-09 NOTE — Telephone Encounter (Signed)
Tiffany Moran from [employer] call and stated she want to know if you have a EGF4 for Paxlvid Tiffany Moran want a call back at [phone].

## 2022-02-09 NOTE — Telephone Encounter (Signed)
After being on hold, Left detailed message on machine with patient's GFR 79.

## 2022-02-09 NOTE — Telephone Encounter (Signed)
° ° °  Chief Complaint: COVID 19 positive this morning Symptoms: Nausea,vomiting,diarrhea Frequency: Started yesterday Pertinent Negatives: Patient denies SOB Disposition: [] ED /[] Urgent Care (no appt availability in office) / [] Appointment(In office/virtual)/ []  [facility]/ [] Home Care/ [] Refused Recommended Disposition /[] [facility]/ [x]  Follow-up with PCP Additional Notes: Asking for Paxlovid.Instructed to call her PCP.  Reason for Disposition  [1] HIGH RISK for severe COVID complications (e.g., weak immune system, age > 23 years, obesity with BMI > 25, pregnant, chronic lung disease or other chronic medical condition) AND [2] COVID symptoms (e.g., cough, fever)  (Exceptions: Already seen by PCP and no new or worsening symptoms.)  Answer Assessment - Initial Assessment Questions 1. COVID-19 DIAGNOSIS: "Who made your COVID-19 diagnosis?" "Was it confirmed by a positive lab test or self-test?" If not diagnosed by a doctor (or NP/PA), ask "Are there lots of cases (community spread) where you live?" Note: See public health department website, if unsure.     Home 2. COVID-19 EXPOSURE: "Was there any known exposure to COVID before the symptoms began?" CDC Definition of close contact: within 6 feet (2 meters) for a total of 15 minutes or more over a 24-hour period.      No 3. ONSET: "When did the COVID-19 symptoms start?"      Yesterday 4. WORST SYMPTOM: "What is your worst symptom?" (e.g., cough, fever, shortness of breath, muscle aches)     Nausea, vomiting 5. COUGH: "Do you have a cough?" If Yes, ask: "How bad is the cough?"       No 6. FEVER: "Do you have a fever?" If Yes, ask: "What is your temperature, how was it measured, and when did it start?"     Low grade 7. RESPIRATORY STATUS: "Describe your breathing?" (e.g., shortness of breath, wheezing, unable to speak)      No 8. BETTER-SAME-WORSE: "Are you getting better, staying the same or getting worse compared to  yesterday?"  If getting worse, ask, "In what way?"     Worse 9. HIGH RISK DISEASE: "Do you have any chronic medical problems?" (e.g., asthma, heart or lung disease, weak immune system, obesity, etc.)     Yes 10. VACCINE: "Have you had the COVID-19 vaccine?" If Yes, ask: "Which one, how many shots, when did you get it?"       Yes 11. BOOSTER: "Have you received your COVID-19 booster?" If Yes, ask: "Which one and when did you get it?"       Yes 12. PREGNANCY: "Is there any chance you are pregnant?" "When was your last menstrual period?"       No 13. OTHER SYMPTOMS: "Do you have any other symptoms?"  (e.g., chills, fatigue, headache, loss of smell or taste, muscle pain, sore throat)       Headache 14. O2 SATURATION MONITOR:  "Do you use an oxygen saturation monitor (pulse oximeter) at home?" If Yes, ask "What is your reading (oxygen level) today?" "What is your usual oxygen saturation reading?" (e.g., 95%)       No  Protocols used: Coronavirus (COVID-19) Diagnosed or Suspected-A-AH

## 2022-02-10 ENCOUNTER — Encounter: Payer: Self-pay | Admitting: Internal Medicine

## 2022-02-11 NOTE — Telephone Encounter (Signed)
Contacted pateint on Friday.    SHe is feeling some better  OK to hold meds  while taking antiviral   May notice more skips

## 2022-02-12 ENCOUNTER — Encounter: Payer: Self-pay | Admitting: Internal Medicine

## 2022-02-14 ENCOUNTER — Telehealth: Payer: Self-pay | Admitting: Internal Medicine

## 2022-02-14 NOTE — Telephone Encounter (Signed)
Connected to [facility] 2.24.2023.  Caller states she has chest congestion, productive cough and chills. Reports testing positive for covid Wednesday. Denies fever.  Advised to call PCP within 24 hours.

## 2022-02-17 ENCOUNTER — Ambulatory Visit (INDEPENDENT_AMBULATORY_CARE_PROVIDER_SITE_OTHER): Payer: Medicare Other | Admitting: Nurse Practitioner

## 2022-02-17 ENCOUNTER — Other Ambulatory Visit: Payer: Self-pay

## 2022-02-17 ENCOUNTER — Ambulatory Visit (INDEPENDENT_AMBULATORY_CARE_PROVIDER_SITE_OTHER): Payer: Medicare Other

## 2022-02-17 ENCOUNTER — Encounter: Payer: Self-pay | Admitting: Nurse Practitioner

## 2022-02-17 VITALS — BP 120/76 | HR 81 | Temp 98.0°F | Ht 65.5 in | Wt 141.5 lb

## 2022-02-17 DIAGNOSIS — U071 COVID-19: Secondary | ICD-10-CM | POA: Diagnosis not present

## 2022-02-17 MED ORDER — PREDNISONE 20 MG PO TABS: 20.0000 mg | ORAL_TABLET | Freq: Every day | ORAL | 0 refills | Status: DC

## 2022-02-17 NOTE — Patient Instructions (Signed)
OTC Options for symptom management:  ? ?Follow dosing directions on packages. ? ?Flonase nasal spray, saline nasal spray, antihistamine (such as claritin OR allegra OR zyrtec), tylenol, Delsym ? ?Also take your prescription medication: prednisone ?

## 2022-02-17 NOTE — Progress Notes (Addendum)
? ? ? ?Subjective:  ?Patient ID: Tiffany Moran, female    DOB: 10/25/1956  Age: 66 y.o. MRN: [id number] ? ?CC:  ?Chief Complaint  ?Patient presents with  ? Cough  ? Nasal Congestion  ?  X 8 days positive covid 02/09/2022  ?  ? ? ?HPI  ?This patient arrives today for the above. ? ?Symptoms started initially 9 days ago, she tested positive for COVID 8 days ago.  Since then she has taken a course of paxlovid but still continues to have some lingering symptoms.  Her main concern is that she feels she cannot take a deep breath, she continues to have some bloody postnasal drip, and is producing thick yellow sputum when coughing.  She denies shortness of breath, she does have an intermittent cough.  She has been taking Delsym over-the-counter which has helped with her cough.  She has had some intermittent abdominal discomfort but initially she was having nausea, vomiting, and diarrhea which this seems to have mostly resolved now.  She has received 2 vaccines against COVID-19.  She also mentions some mild ear discomfort to her right ear. ? ?Past Medical History:  ?Diagnosis Date  ? Cardiospasm 1983  ? felt may be related to MVP  ? Hyperlipidemia   ? Hypertension   ? Kidney cysts 11/2015  ? cyst on one of her kidneys, no treatment required  ? Mitral valve prolapse   ? PONV (postoperative nausea and vomiting)   ? teeth chipped  ? Raynaud disease   ? Retinal hole of right eye   ? Shingles 2012  ? leg  ? Tachycardia   ? ? ? ? ?Family History  ?Problem Relation Age of Onset  ? Heart attack Father 41  ? Myasthenia gravis Father   ? Hypertension Father   ? Hyperlipidemia Father   ? Osteoporosis Father   ?     due to steroids for Myasthenia Grvis  ? Leukemia Mother 38  ? Hyperlipidemia Mother   ? Hypertension Mother   ? Hypothyroidism Mother   ? Prostate cancer Brother   ?     in 19s  ? Hyperlipidemia Brother   ? Stroke Maternal Grandfather   ?     in 25s  ? Healthy Son   ? Healthy Daughter   ? Diabetes Neg Hx   ? ? ?Social History   ? ?Social History Narrative  ? Exercises regularly  ? ?Social History  ? ?Tobacco Use  ? Smoking status: Never  ? Smokeless tobacco: Never  ?Substance Use Topics  ? Alcohol use: No  ? ? ? ?Current Meds  ?Medication Sig  ? ALPRAZolam (XANAX) 0.5 MG tablet TAKE ONE TABLET BY MOUTH EVERY 8 HOURS AS NEEDED FOR ANXIETY  ? Cholecalciferol (VITAMIN D) 2000 units CAPS Take 2,000 Units by mouth daily.  ? diltiazem (DILACOR XR) 120 MG 24 hr capsule TAKE 1 CAPSULE BY MOUTH DAILY  ? fluticasone (FLONASE) 50 MCG/ACT nasal spray Place 1 spray into both nostrils daily. PRN  ? hydrocortisone (ANUSOL-HC) 2.5 % rectal cream Place 1 application rectally 2 (two) times daily.  ? levalbuterol (XOPENEX HFA) 45 MCG/ACT inhaler Inhale 1 puff into the lungs every 4 (four) hours as needed for wheezing or shortness of breath.  ? loperamide (IMODIUM) 2 MG capsule Take 1 capsule (2 mg total) by mouth 2 (two) times daily as needed for diarrhea or loose stools.  ? Loteprednol Etabonate (LOTEMAX OP) Apply to eye as needed.   ? metoprolol succinate (TOPROL-XL)  25 MG 24 hr tablet TAKE 1 TABLET BY MOUTH IN THE MORNING AND TAKE 1 TABLET AT BEDTIME  ? predniSONE (DELTASONE) 20 MG tablet Take 1 tablet (20 mg total) by mouth daily with breakfast.  ? promethazine (PHENERGAN) 25 MG suppository Place 1 suppository (25 mg total) rectally every 6 (six) hours as needed for nausea or vomiting.  ? rosuvastatin (CRESTOR) 20 MG tablet Take 1 tablet (20 mg total) by mouth daily.  ? ? ?ROS:  ?Review of Systems  ?Constitutional:  Positive for chills.  ?HENT:  Positive for congestion (bloody discharge) and sore throat.   ?Respiratory:  Positive for cough and sputum production (thick mucus). Negative for shortness of breath.   ?Cardiovascular:  Negative for chest pain.  ?Gastrointestinal:  Positive for abdominal pain. Negative for nausea and vomiting.  ? ? ?Objective:  ? ?Today's Vitals: BP 120/76   Pulse 81   Temp 98 ?F (36.7 ?C) (Oral)   Ht 5' 5.5" (1.664 m)   Wt  141 lb 8 oz (64.2 kg)   LMP 06/17/2009 (Approximate)   SpO2 97%   BMI 23.19 kg/m?  ?Vitals with BMI 02/17/2022 02/09/2022 01/18/2022  ?Height 5' 5.5" - 5' 5.5"  ?Weight 141 lbs 8 oz 145 lbs 148 lbs  ?BMI 23.18 - 24.25  ?Systolic 128 - 786  ?Diastolic 76 - 78  ?Pulse 81 - 70  ?  ? ?Physical Exam ?Vitals reviewed.  ?Constitutional:   ?   General: She is not in acute distress. ?   Appearance: Normal appearance.  ?HENT:  ?   Head: Normocephalic and atraumatic.  ?   Right Ear: Hearing, tympanic membrane, ear canal and external ear normal.  ?   Ears:  ?   Comments: Full tympanic membrane not visualized due to patient anatomy.  Portion that was visualized appears to be within normal limits, no redness, no pus, no swelling. ?Neck:  ?   Vascular: No carotid bruit.  ?Cardiovascular:  ?   Rate and Rhythm: Normal rate and regular rhythm.  ?   Pulses: Normal pulses.  ?   Heart sounds: Normal heart sounds.  ?Pulmonary:  ?   Effort: Pulmonary effort is normal.  ?   Breath sounds: Normal breath sounds.  ?Skin: ?   General: Skin is warm and dry.  ?Neurological:  ?   General: No focal deficit present.  ?   Mental Status: She is alert and oriented to person, place, and time.  ?Psychiatric:     ?   Mood and Affect: Mood normal.     ?   Behavior: Behavior normal.     ?   Judgment: Judgment normal.  ? ? ? ? ? ? ? ?Assessment and Plan  ? ?1. COVID-19   ? ? ? ?Plan: ?1.  Symptoms remain mild, VS stable, patient is not in respiratory failure at this time.  For now I recommended symptomatic treatment at this point.  We did discuss risks and benefits of taking short course of steroids and patient would like to try the steroid to see if this would help with her symptoms.  Will prescribe 5 days worth of prednisone, we did discuss common side effects of medication and how to take it.  We also discussed using over-the-counter Flonase nasal spray, saline nasal spray, antihistamine, and Tylenol as needed.  No evidence of crackles or rales on exam  today however patient is very concerned about lung damage so we will get x-ray for further evaluation.  She  was told if symptoms persist until her next appointment which is later this month she can notify her primary care provider at which point may consider referral to pulmonology for further evaluation.  She reports her understanding and is agreeable with current plan.  I did reach out to supervising physician (Dr. Quay Burow), to discuss possibly referring patient for monoclonal antibody infusion.  As this treatment is no longer routinely recommended especially considering patient is 9 days out from symptom onset, will not refer patient for monoclonal antibody infusion at this time.  Patient told she can come out of isolation but that she should continue wearing a mask for the next couple days or so.  She continues to cough she may want to consider wearing a mask longer while in public.  She reports her understanding. ? ? ?Tests ordered ?Orders Placed This Encounter  ?Procedures  ? DG Chest 2 View  ? ? ? ? ?Meds ordered this encounter  ?Medications  ? predniSONE (DELTASONE) 20 MG tablet  ?  Sig: Take 1 tablet (20 mg total) by mouth daily with breakfast.  ?  Dispense:  5 tablet  ?  Refill:  0  ?  Order Specific Question:   Supervising Provider  ?  Answer:   Binnie Rail [[id number]]  ? ? ?Patient to follow-up as scheduled later this month or sooner as needed. ? ?Ailene Ards, NP ? ?

## 2022-02-23 ENCOUNTER — Encounter: Payer: Self-pay | Admitting: Nurse Practitioner

## 2022-02-23 NOTE — Telephone Encounter (Signed)
Pt has finished her prednisone that was prescribed by you on 02/17/22 but is still experiencing symptoms. Please advise ?

## 2022-02-23 NOTE — Telephone Encounter (Signed)
Pt has finished Prednisone that was prescribed on 02/17/22 for positive COVID test on 02/09/22 but is still experiencing symptoms such as chills and nasal congestion. Pt is wondering what you advise for next steps ?

## 2022-02-24 ENCOUNTER — Other Ambulatory Visit: Payer: Self-pay | Admitting: Nurse Practitioner

## 2022-02-24 DIAGNOSIS — U071 COVID-19: Secondary | ICD-10-CM

## 2022-02-24 MED ORDER — PREDNISONE 10 MG (21) PO TBPK: ORAL_TABLET | ORAL | 0 refills | Status: DC

## 2022-02-24 NOTE — Telephone Encounter (Signed)
Pt would like the steroids sent in ?

## 2022-03-01 ENCOUNTER — Encounter: Payer: Self-pay | Admitting: Internal Medicine

## 2022-03-12 ENCOUNTER — Encounter: Payer: Self-pay | Admitting: Internal Medicine

## 2022-03-12 NOTE — Patient Instructions (Addendum)
? ? ? ?  Blood work was ordered.   ? ? ?Medications changes include :   None ? ? ? ?A thyroid ultrasound was ordered.     Someone will call you to schedule an appointment.  ? ? ?Return in about 1 year (around 03/14/2023) for follow up. ? ?

## 2022-03-12 NOTE — Progress Notes (Signed)
? ? ? ? ?Subjective:  ? ? Patient ID: Tiffany Moran, female    DOB: [birth year], 66 y.o.   MRN: [id number] ? ?This visit occurred during the SARS-CoV-2 public health emergency.  Safety protocols were in place, including screening questions prior to the visit, additional usage of staff PPE, and extensive cleaning of exam room while observing appropriate contact time as indicated for disinfecting solutions.   ? ? ?HPI ?Adyn is here for follow up of her chronic medical problems, including htn, anxiety, hld, osteopenia ? ? ?Right lower eye lid twitching x 4 days - no longer now.   ? ?Sore place on head. ? ?? Skin lesion on left forehead.  ? ?Still having blood nose discharge at times.  Uses flonase as needed.  ? ?Dr Watt Climes advised she stop crestor - deep burping, increased gas - no change yet.  Took prevacid x 5 days and probiotic.   Not drinking as much water.   ? ?Just getting over covid.  Initially had N/v/d, cough, sore throat, congestion.  LN's tender.   Still has chills.  Took paxlovid but gave her a stomach ache, bad taste, lip blister.  She did take steroids and everything got better, but her stomach did get worse.   ? ?Walks almost daily - 1-2 miles.   ? ? ? ?Medications and allergies reviewed with patient and updated if appropriate. ? ?Current Outpatient Medications on File Prior to Visit  ?Medication Sig Dispense Refill  ? ALPRAZolam (XANAX) 0.5 MG tablet TAKE ONE TABLET BY MOUTH EVERY 8 HOURS AS NEEDED FOR ANXIETY 15 tablet 0  ? Cholecalciferol (VITAMIN D) 2000 units CAPS Take 2,000 Units by mouth daily.    ? diltiazem (DILACOR XR) 120 MG 24 hr capsule TAKE 1 CAPSULE BY MOUTH DAILY 90 capsule 3  ? fluticasone (FLONASE) 50 MCG/ACT nasal spray Place 1 spray into both nostrils daily. PRN    ? hydrocortisone (ANUSOL-HC) 2.5 % rectal cream Place 1 application rectally 2 (two) times daily. 30 g 0  ? levalbuterol (XOPENEX HFA) 45 MCG/ACT inhaler Inhale 1 puff into the lungs every 4 (four) hours as needed for wheezing  or shortness of breath. 1 each 0  ? loperamide (IMODIUM) 2 MG capsule Take 1 capsule (2 mg total) by mouth 2 (two) times daily as needed for diarrhea or loose stools. 10 capsule 0  ? Loteprednol Etabonate (LOTEMAX OP) Apply to eye as needed.     ? metoprolol succinate (TOPROL-XL) 25 MG 24 hr tablet TAKE 1 TABLET BY MOUTH IN THE MORNING AND TAKE 1 TABLET AT BEDTIME 180 tablet 3  ? promethazine (PHENERGAN) 25 MG suppository Place 1 suppository (25 mg total) rectally every 6 (six) hours as needed for nausea or vomiting. 5 each 1  ? estradiol (ESTRACE VAGINAL) 0.1 MG/GM vaginal cream 1 gram pv twice weekly. (Patient not taking: Reported on 02/17/2022) 42.5 g 4  ? rosuvastatin (CRESTOR) 20 MG tablet Take 1 tablet (20 mg total) by mouth daily. 90 tablet 3  ? ?No current facility-administered medications on file prior to visit.  ? ? ? ?Review of Systems  ?Constitutional:  Negative for fever.  ?Respiratory:  Positive for cough (occ to clear mucus since covid). Negative for shortness of breath and wheezing.   ?Cardiovascular:  Negative for chest pain, palpitations and leg swelling.  ?Neurological:  Positive for light-headedness (if gets up too quickly). Negative for headaches.  ? ?   ?Objective:  ? ?Vitals:  ? 03/13/22 0749  ?BP: 122/76  ?  Pulse: 64  ?Temp: 98.6 ?F (37 ?C)  ?SpO2: 97%  ? ?BP Readings from Last 3 Encounters:  ?03/13/22 122/76  ?02/17/22 120/76  ?01/18/22 (!) 162/78  ? ?Wt Readings from Last 3 Encounters:  ?03/13/22 144 lb 3.2 oz (65.4 kg)  ?02/17/22 141 lb 8 oz (64.2 kg)  ?02/09/22 145 lb (65.8 kg)  ? ?Body mass index is 23.63 kg/m?. ? ?  ?Physical Exam ?Constitutional:   ?   General: She is not in acute distress. ?   Appearance: Normal appearance.  ?HENT:  ?   Head: Normocephalic and atraumatic.  ?Eyes:  ?   Conjunctiva/sclera: Conjunctivae normal.  ?Cardiovascular:  ?   Rate and Rhythm: Normal rate and regular rhythm.  ?   Heart sounds: Normal heart sounds. No murmur heard. ?Pulmonary:  ?   Effort: Pulmonary  effort is normal. No respiratory distress.  ?   Breath sounds: Normal breath sounds. No wheezing.  ?Abdominal:  ?   General: There is no distension.  ?   Palpations: Abdomen is soft.  ?   Tenderness: There is no abdominal tenderness. There is no guarding or rebound.  ?Musculoskeletal:  ?   Cervical back: Neck supple.  ?   Right lower leg: No edema.  ?   Left lower leg: No edema.  ?Lymphadenopathy:  ?   Cervical: No cervical adenopathy.  ?Skin: ?   General: Skin is warm and dry.  ?   Findings: No rash.  ?Neurological:  ?   Mental Status: She is alert. Mental status is at baseline.  ?Psychiatric:     ?   Mood and Affect: Mood normal.     ?   Behavior: Behavior normal.  ? ?   ? ?Lab Results  ?Component Value Date  ? WBC 6.0 09/20/2021  ? HGB 14.1 09/20/2021  ? HCT 41.9 09/20/2021  ? PLT 204 09/20/2021  ? GLUCOSE 85 09/20/2021  ? CHOL 198 09/20/2021  ? TRIG 72 09/20/2021  ? HDL 76 09/20/2021  ? LDLCALC 109 (H) 09/20/2021  ? ALT 27 03/10/2021  ? AST 21 03/10/2021  ? NA 140 09/20/2021  ? K 4.3 09/20/2021  ? CL 103 09/20/2021  ? CREATININE 0.82 09/20/2021  ? BUN 12 09/20/2021  ? CO2 24 09/20/2021  ? TSH 2.58 03/10/2021  ? HGBA1C 5.5 09/20/2021  ? ? ? ?Assessment & Plan:  ? ? ?See Problem List for Assessment and Plan of chronic medical problems.  ? ? ?

## 2022-03-13 ENCOUNTER — Ambulatory Visit (INDEPENDENT_AMBULATORY_CARE_PROVIDER_SITE_OTHER): Payer: Medicare Other | Admitting: Internal Medicine

## 2022-03-13 ENCOUNTER — Other Ambulatory Visit: Payer: Self-pay

## 2022-03-13 VITALS — BP 122/76 | HR 64 | Temp 98.6°F | Ht 65.5 in | Wt 144.2 lb

## 2022-03-13 DIAGNOSIS — E042 Nontoxic multinodular goiter: Secondary | ICD-10-CM | POA: Diagnosis not present

## 2022-03-13 DIAGNOSIS — I471 Supraventricular tachycardia: Secondary | ICD-10-CM

## 2022-03-13 DIAGNOSIS — R739 Hyperglycemia, unspecified: Secondary | ICD-10-CM | POA: Diagnosis not present

## 2022-03-13 DIAGNOSIS — E559 Vitamin D deficiency, unspecified: Secondary | ICD-10-CM | POA: Diagnosis not present

## 2022-03-13 DIAGNOSIS — I10 Essential (primary) hypertension: Secondary | ICD-10-CM | POA: Diagnosis not present

## 2022-03-13 DIAGNOSIS — F419 Anxiety disorder, unspecified: Secondary | ICD-10-CM

## 2022-03-13 DIAGNOSIS — E782 Mixed hyperlipidemia: Secondary | ICD-10-CM

## 2022-03-13 DIAGNOSIS — M85852 Other specified disorders of bone density and structure, left thigh: Secondary | ICD-10-CM

## 2022-03-13 LAB — COMPREHENSIVE METABOLIC PANEL
ALT: 16 U/L (ref 0–35)
AST: 16 U/L (ref 0–37)
Albumin: 4.6 g/dL (ref 3.5–5.2)
Alkaline Phosphatase: 80 U/L (ref 39–117)
BUN: 14 mg/dL (ref 6–23)
CO2: 32 mEq/L (ref 19–32)
Calcium: 9.8 mg/dL (ref 8.4–10.5)
Chloride: 104 mEq/L (ref 96–112)
Creatinine, Ser: 0.86 mg/dL (ref 0.40–1.20)
GFR: 70.41 mL/min (ref >60.00)
Glucose, Bld: 93 mg/dL (ref 70–99)
Potassium: 4.3 mEq/L (ref 3.5–5.1)
Sodium: 140 mEq/L (ref 135–145)
Total Bilirubin: 0.7 mg/dL (ref 0.2–1.2)
Total Protein: 7.1 g/dL (ref 6.0–8.3)

## 2022-03-13 LAB — CBC WITH DIFFERENTIAL/PLATELET
Basophils Absolute: 0 10*3/uL (ref 0.0–0.1)
Basophils Relative: 0.5 % (ref 0.0–3.0)
Eosinophils Absolute: 0.1 10*3/uL (ref 0.0–0.7)
Eosinophils Relative: 2 % (ref 0.0–5.0)
HCT: 40.3 % (ref 36.0–46.0)
Hemoglobin: 13.8 g/dL (ref 12.0–15.0)
Lymphocytes Relative: 29.2 % (ref 12.0–46.0)
Lymphs Abs: 1.1 10*3/uL (ref 0.7–4.0)
MCHC: 34.2 g/dL (ref 30.0–36.0)
MCV: 90.4 fl (ref 78.0–100.0)
Monocytes Absolute: 0.2 10*3/uL (ref 0.1–1.0)
Monocytes Relative: 6.7 % (ref 3.0–12.0)
Neutro Abs: 2.3 10*3/uL (ref 1.4–7.7)
Neutrophils Relative %: 61.6 % (ref 43.0–77.0)
Platelets: 131 10*3/uL — ABNORMAL LOW (ref 150.0–400.0)
RBC: 4.46 Mil/uL (ref 3.87–5.11)
RDW: 13.5 % (ref 11.5–15.5)
WBC: 3.7 10*3/uL — ABNORMAL LOW (ref 4.0–10.5)

## 2022-03-13 LAB — HEMOGLOBIN A1C: Hgb A1c MFr Bld: 5.6 % (ref 4.6–6.5)

## 2022-03-13 NOTE — Assessment & Plan Note (Signed)
Chronic ?Managed by cardiology ?Controlled, Stable ?Continue diltiazem 120 mg daily, metoprolol XL 25 mg twice daily ?

## 2022-03-13 NOTE — Assessment & Plan Note (Addendum)
Chronic ?Regular exercise and healthy diet encouraged ?Lipids controlled ?Managed by cardiology ?Continue crestor 20 mg daily - on hold right now for ? GI side effects, will try restarting when able ?

## 2022-03-13 NOTE — Assessment & Plan Note (Signed)
Chronic ?dexa up to date ?Taking vitamin d  - check level ?Walking regularly ?

## 2022-03-13 NOTE — Assessment & Plan Note (Signed)
Chronic ?Ultrasound ordered to follow-up thyroid nodules ?Check TSH ?

## 2022-03-13 NOTE — Assessment & Plan Note (Signed)
Chronic Check a1c Low sugar / carb diet Stressed regular exercise  

## 2022-03-13 NOTE — Assessment & Plan Note (Signed)
Chronic Taking vitamin D daily Check vitamin D level  

## 2022-03-13 NOTE — Assessment & Plan Note (Addendum)
Chronic ?Managed by cardiology ?Blood pressure well controlled ?CMP ?Continue diltiazem 120 mg daily, metoprolol XL 25 mg twice daily ?

## 2022-03-13 NOTE — Assessment & Plan Note (Signed)
Chronic ?Controlled, Stable ?Continue alprazolam 0.5 mg 3 times daily as needed ?

## 2022-03-14 LAB — VITAMIN D 25 HYDROXY (VIT D DEFICIENCY, FRACTURES): VITD: 78.45 ng/mL (ref 30.00–100.00)

## 2022-03-14 LAB — TSH: TSH: 2.9 u[IU]/mL (ref 0.35–5.50)

## 2022-03-16 ENCOUNTER — Encounter: Payer: Self-pay | Admitting: Internal Medicine

## 2022-03-16 DIAGNOSIS — D72819 Decreased white blood cell count, unspecified: Secondary | ICD-10-CM

## 2022-03-20 ENCOUNTER — Ambulatory Visit
Admission: RE | Admit: 2022-03-20 | Discharge: 2022-03-20 | Disposition: A | Discharge status: Home / Self Care | Payer: Medicare Other | Source: Ambulatory Visit | Attending: Internal Medicine | Admitting: Internal Medicine

## 2022-03-20 DIAGNOSIS — E042 Nontoxic multinodular goiter: Secondary | ICD-10-CM

## 2022-04-03 ENCOUNTER — Ambulatory Visit: Payer: Medicare Other

## 2022-04-06 ENCOUNTER — Telehealth: Payer: Self-pay | Admitting: Internal Medicine

## 2022-04-06 MED ORDER — ALPRAZOLAM 0.5 MG PO TABS: ORAL_TABLET | ORAL | 1 refills | Status: DC

## 2022-04-06 NOTE — Telephone Encounter (Signed)
1.Medication Requested: ?ALPRAZolam (XANAX) 0.5 MG tablet ?2. Pharmacy (Name, Street, [place]): ?[place], [place] - [address] Phone:  [phone]  ?Fax:  [phone]  ?  ? ?3. On Med List: yes ? ?4. Last Visit with PCP: ? ?5. Next visit date with PCP: ? ? ?Agent: Please be advised that RX refills may take up to 3 business days. We ask that you follow-up with your pharmacy.  ?

## 2022-04-17 ENCOUNTER — Telehealth: Payer: Self-pay | Admitting: Internal Medicine

## 2022-04-17 DIAGNOSIS — Z79899 Other long term (current) drug therapy: Secondary | ICD-10-CM

## 2022-04-17 DIAGNOSIS — E785 Hyperlipidemia, unspecified: Secondary | ICD-10-CM

## 2022-04-17 NOTE — Telephone Encounter (Signed)
Pt transferred to nurses  ?

## 2022-04-17 NOTE — Telephone Encounter (Signed)
Patient states labs were order back in February but were done in the wrong tubes.  She then got sick and was off the medications.  In the middle of June she will back on the medication for two month as requested. She will need to repeat labs then.  She would like to be reordered for then.   ?

## 2022-04-17 NOTE — Telephone Encounter (Signed)
Left a message for the pt to call back.  ? ?Pt needs NMR only.  ?

## 2022-04-17 NOTE — Telephone Encounter (Signed)
Pt to have her NMR 05/24/22.  ?

## 2022-04-18 ENCOUNTER — Other Ambulatory Visit (INDEPENDENT_AMBULATORY_CARE_PROVIDER_SITE_OTHER): Payer: Medicare Other

## 2022-04-18 DIAGNOSIS — D72819 Decreased white blood cell count, unspecified: Secondary | ICD-10-CM

## 2022-04-18 LAB — CBC WITH DIFFERENTIAL/PLATELET
Basophils Absolute: 0 10*3/uL (ref 0.0–0.1)
Basophils Relative: 0.5 % (ref 0.0–3.0)
Eosinophils Absolute: 0.1 10*3/uL (ref 0.0–0.7)
Eosinophils Relative: 1.6 % (ref 0.0–5.0)
HCT: 40.5 % (ref 36.0–46.0)
Hemoglobin: 13.7 g/dL (ref 12.0–15.0)
Lymphocytes Relative: 28.8 % (ref 12.0–46.0)
Lymphs Abs: 1.4 10*3/uL (ref 0.7–4.0)
MCHC: 33.9 g/dL (ref 30.0–36.0)
MCV: 90.1 fl (ref 78.0–100.0)
Monocytes Absolute: 0.3 10*3/uL (ref 0.1–1.0)
Monocytes Relative: 5.7 % (ref 3.0–12.0)
Neutro Abs: 3.1 10*3/uL (ref 1.4–7.7)
Neutrophils Relative %: 63.4 % (ref 43.0–77.0)
Platelets: 159 10*3/uL (ref 150.0–400.0)
RBC: 4.5 Mil/uL (ref 3.87–5.11)
RDW: 13.6 % (ref 11.5–15.5)
WBC: 4.9 10*3/uL (ref 4.0–10.5)

## 2022-05-12 ENCOUNTER — Encounter: Payer: Self-pay | Admitting: Internal Medicine

## 2022-05-12 ENCOUNTER — Telehealth: Payer: Self-pay | Admitting: Internal Medicine

## 2022-05-12 NOTE — Telephone Encounter (Signed)
Patient c/o Palpitations:  High priority if patient c/o lightheadedness, shortness of breath, or chest pain  How long have you had palpitations/irregular HR/ Afib? Are you having the symptoms now? Not at this time- just had some about an hour ago  Are you currently experiencing lightheadedness, SOB or CP?  No  Do you have a history of afib (atrial fibrillation) or irregular heart rhythm?   Have you checked your BP or HR? (document readings if available):  120 to 149/87 yesterday today it was 133/75 pulse between 60 and 70  Are you experiencing any other symptoms? really fatigued

## 2022-05-12 NOTE — Telephone Encounter (Signed)
Called pt in regards to palpitations.  Pt reports palpitations have occurred since had Covid in Feb.  Pt would like to know after how many heart flutters should she take extra dose of metoprolol.  Pt reports was told to take extra dose by MD but would like clarification.  Has felt more fatigued than normal for the past 3 days. Reports has adequate fluid intake, takes all medications as ordered, drinks 1 cup of coffee every morning.  Palpitations happen at random times.  Took 1/2 xanax yesterday made drowsy and did not focus on palpitations.  Advised pt that MD and nurse are not in the office.  I will send message to be addressed if needs further assistance prior to reply to call into the office.

## 2022-05-12 NOTE — Telephone Encounter (Signed)
Can try an extra 1/2 toprol XL   Start with in AM   Can add mid day Stay hydrated

## 2022-05-12 NOTE — Telephone Encounter (Signed)
Please see mychart encounter from today

## 2022-05-24 ENCOUNTER — Other Ambulatory Visit: Payer: Medicare Other

## 2022-05-24 DIAGNOSIS — Z79899 Other long term (current) drug therapy: Secondary | ICD-10-CM

## 2022-05-24 DIAGNOSIS — E785 Hyperlipidemia, unspecified: Secondary | ICD-10-CM

## 2022-05-25 LAB — NMR, LIPOPROFILE
Cholesterol, Total: 176 mg/dL (ref 100–199)
HDL Particle Number: 39.6 umol/L (ref >=30.5)
HDL-C: 86 mg/dL (ref >39)
LDL Particle Number: 747 nmol/L (ref <1000)
LDL Size: 21 nm (ref >20.5)
LDL-C (NIH Calc): 77 mg/dL (ref 0–99)
LP-IR Score: <25 (ref <=45)
Small LDL Particle Number: <90 nmol/L (ref <=527)
Triglycerides: 67 mg/dL (ref 0–149)

## 2022-05-31 ENCOUNTER — Encounter: Payer: Self-pay | Admitting: Internal Medicine

## 2022-05-31 ENCOUNTER — Telehealth: Payer: Self-pay

## 2022-05-31 DIAGNOSIS — E785 Hyperlipidemia, unspecified: Secondary | ICD-10-CM

## 2022-05-31 DIAGNOSIS — Z79899 Other long term (current) drug therapy: Secondary | ICD-10-CM

## 2022-05-31 MED ORDER — EZETIMIBE 10 MG PO TABS: 5.0000 mg | ORAL_TABLET | Freq: Every day | ORAL | 3 refills | Status: DC

## 2022-05-31 NOTE — Telephone Encounter (Signed)
See other MyChart message

## 2022-05-31 NOTE — Telephone Encounter (Signed)
-----   Message from Dorris Carnes V, MD sent at 05/29/2022  4:22 PM EDT ----- LDL is 77   Given plaquing in prox LAD, would recomm that it is lower   DO not want progression/instability of plaque WOuld keep on Crestor    Add 5 mg Zetia to this    F/U lipomed in 8 wks

## 2022-05-31 NOTE — Telephone Encounter (Signed)
Spoke with the pt... she will try the Zetia 5 mg and will let me know when she is ready to determine repeat labs if she will have them at her 07/24/22 office visit or come in prior to her appt.

## 2022-06-02 ENCOUNTER — Encounter: Payer: Self-pay | Admitting: Internal Medicine

## 2022-06-06 MED ORDER — METOPROLOL SUCCINATE ER 25 MG PO TB24: ORAL_TABLET | ORAL | 3 refills | Status: DC

## 2022-06-16 ENCOUNTER — Encounter: Payer: Self-pay | Admitting: Internal Medicine

## 2022-06-26 ENCOUNTER — Encounter: Payer: Self-pay | Admitting: Internal Medicine

## 2022-06-29 ENCOUNTER — Telehealth: Payer: Self-pay | Admitting: Internal Medicine

## 2022-06-29 NOTE — Telephone Encounter (Signed)
Patient c/o Palpitations:  High priority if patient c/o lightheadedness, shortness of breath, or chest pain  How long have you had palpitations/irregular HR/ Afib? Are you having the symptoms now? This week (at night)  Are you currently experiencing lightheadedness, SOB or CP? No  Do you have a history of afib (atrial fibrillation) or irregular heart rhythm? Yes  Have you checked your BP or HR? (document readings if available): No  Are you experiencing any other symptoms? Resting HR has been waking pt up at night.

## 2022-06-29 NOTE — Telephone Encounter (Signed)
I spoke with the pt.. she called to report that she has been having an "irregular" heart beat mostly at night... it awakens her and she tries to change position and cough hard and use the bathroom but nothing helps.... she has taken an extra 1/2 Toprol on Monday morning and it really did not help... she is still taking it BID... the only thing that she has changed is taking Nordic Omega-3 690 mg... she says she read the package insert and it says it can cause Afib... her palpitations have been on and off and worse at night.NO SOB or dizziness associated with them.    I advised her to stop the Omega-3 for now to see if improvement.Marland Kitchen and I will forward to Dr Harrington Challenger for her review.

## 2022-06-29 NOTE — Telephone Encounter (Signed)
Agree with stopping   Follow for a couple weeks Could also take 1 whole metoprolol at night

## 2022-06-29 NOTE — Telephone Encounter (Signed)
Left a message for the pt to call back.  

## 2022-06-29 NOTE — Telephone Encounter (Signed)
Left a message for the pt will also send her a My Chart.

## 2022-07-02 ENCOUNTER — Encounter: Payer: Self-pay | Admitting: Internal Medicine

## 2022-07-03 NOTE — Progress Notes (Unsigned)
Cardiology Office Note   Date:  07/04/2022   ID:  Tiffany Moran, DOB 1956-05-17, MRN [id number]  PCP:  Binnie Rail, MD  Cardiologist:   Dorris Carnes, MD   F/U of palpitations    History of Present Illness: Tiffany Moran is a 66 y.o. female with a history of palpitations (monitor in past showed short bursts of SVT/NSVT) and mild CAD  The pt had an abnormal myoview in the past taht leat to a coronary CTA   This showed mild nonobstructive CAD        I saw the pt in Feb 2023    She called in recently complaining of more palpitations Appt made for today    The pt says a few wks ago she started an Omega 3 supplement for her eyes.  That is when palpitations got worse   She stopped the supplement   Initally got better then worse    Overall the pt says things worse since she had  COVID Had in Feb 2023   Last Sunday felt good   Went to bed   Woke up with heart  beating    Took an extra metoprolol   Felt fine the rest of the day Tuesday took a Xanax as shee was worried about spells    On July 13 felt skips again Admits to getting very anxious when skpping starts   Denies dizzines    BP readings at home    Past week   110s - 159/ 60s to 90s  P 60s  BP had been 110s to 140s/    Outpatient Medications Prior to Visit  Medication Sig Dispense Refill   ALPRAZolam (XANAX) 0.5 MG tablet TAKE ONE TABLET BY MOUTH EVERY 8 HOURS AS NEEDED FOR ANXIETY 15 tablet 1   Cholecalciferol (VITAMIN D) 2000 units CAPS Take 2,000 Units by mouth daily.     diltiazem (DILACOR XR) 120 MG 24 hr capsule TAKE 1 CAPSULE BY MOUTH DAILY 90 capsule 3   estradiol (ESTRACE VAGINAL) 0.1 MG/GM vaginal cream 1 gram pv twice weekly. 42.5 g 4   ezetimibe (ZETIA) 10 MG tablet Take 0.5 tablets (5 mg total) by mouth daily. 90 tablet 3   fluticasone (FLONASE) 50 MCG/ACT nasal spray Place 1 spray into both nostrils daily. PRN     hydrocortisone (ANUSOL-HC) 2.5 % rectal cream Place 1 application rectally 2 (two) times daily. (Patient  taking differently: Place 1 application  rectally as needed.) 30 g 0   levalbuterol (XOPENEX HFA) 45 MCG/ACT inhaler Inhale 1 puff into the lungs every 4 (four) hours as needed for wheezing or shortness of breath. 1 each 0   loperamide (IMODIUM) 2 MG capsule Take 1 capsule (2 mg total) by mouth 2 (two) times daily as needed for diarrhea or loose stools. 10 capsule 0   Loteprednol Etabonate (LOTEMAX OP) Apply to eye as needed.      metoprolol succinate (TOPROL-XL) 25 MG 24 hr tablet TAKE 1 TABLET BY MOUTH IN THE MORNING AND TAKE 1 TABLET AT BEDTIME 180 tablet 3   promethazine (PHENERGAN) 25 MG suppository Place 1 suppository (25 mg total) rectally every 6 (six) hours as needed for nausea or vomiting. 5 each 1   rosuvastatin (CRESTOR) 20 MG tablet Take 1 tablet (20 mg total) by mouth daily. 90 tablet 3   No facility-administered medications prior to visit.     Allergies:   Lipitor [atorvastatin]   Past Medical History:  Diagnosis Date   Cardiospasm  1983   felt may be related to MVP   Hyperlipidemia    Hypertension    Kidney cysts 11/2015   cyst on one of her kidneys, no treatment required   Mitral valve prolapse    PONV (postoperative nausea and vomiting)    teeth chipped   Raynaud disease    Retinal hole of right eye    Shingles 2012   leg   Tachycardia     Past Surgical History:  Procedure Laterality Date   COLONOSCOPY  2007   negatve, [place] ,[place] N/A 08/29/2016   Procedure: DILATATION & CURETTAGE/HYSTEROSCOPY;  Surgeon: Salvadore Dom, MD;  Location: [facility];  Service: Gynecology;  Laterality: N/A;   DILATION AND CURETTAGE OF UTERUS     EXCISION OF SKIN TAG Left 08/29/2016   Procedure: EXCISION OF SKIN TAG left labia majora;  Surgeon: Salvadore Dom, MD;  Location: [facility];  Service: Gynecology;  Laterality: Left;   EXPLORATORY LAPAROTOMY     X2 for pain: fibroid, enlarged oviduct   RETINAL LASER PROCEDURE Right  08/2015   SALPINGECTOMY  2007 or 2008   and fibroid removed   TUBAL LIGATION       Social History:  The patient  reports that she has never smoked. She has never used smokeless tobacco. She reports that she does not drink alcohol and does not use drugs.   Family History:  The patient's family history includes Healthy in her daughter and son; Heart attack (age of onset: 67) in her father; Hyperlipidemia in her brother, father, and mother; Hypertension in her father and mother; Hypothyroidism in her mother; Leukemia (age of onset: 60) in her mother; Myasthenia gravis in her father; Osteoporosis in her father; Prostate cancer in her brother; Stroke in her maternal grandfather.    ROS:  Please see the history of present illness. All other systems are reviewed and  Negative to the above problem except as noted.    PHYSICAL EXAM: VS:  BP 140/90   Pulse 69   Ht '5\' 6"'$  (1.676 m)   Wt 141 lb (64 kg)   LMP 06/17/2009 (Approximate)   SpO2 99%   BMI 22.76 kg/m   GEN: Pt is in no acute distress  HEENT: normal  Neck: JVP is norrmal Cardiac: RRR; no murmurs   No LE edema  Respiratory:  clear to auscultation bilaterally,  GI: soft, nontender, nondistended   MS: no deformity Moving all extremities   Skin: warm and dry, Erythmatous lesions toes  Neuro:  Strength and sensation are intact Psych: euthymic mood, full affect   EKG:  EKG shows SR 69 bpm   Septal MI  nonspecific ST changes   Lipid Panel    Component Value Date/Time   CHOL 198 09/20/2021 0728   TRIG 72 09/20/2021 0728   HDL 76 09/20/2021 0728   CHOLHDL 2.6 09/20/2021 0728   CHOLHDL 2 03/10/2021 0926   VLDL 16.6 03/10/2021 0926   LDLCALC 109 (H) 09/20/2021 0728    ECHO 02/18/19   1. The left ventricle has normal systolic function with an ejection  fraction of 60-65%. The cavity size was normal. Left ventricular diastolic  parameters were normal No evidence of left ventricular regional wall  motion abnormalities.   2. The  right ventricle has normal systolic function. The cavity size was  normal. There is no increase in right ventricular wall thickness. Right  ventricular systolic pressure is normal with an estimated pressure  of 29  mmHg.   3. Mild myxomatous mitral valve prolapse.   4. Mild late systolic mitral insufficiency with a centrally-directed jet.   5. The average left ventricular global longitudinal strain is -21.6 %.   6. The aortic valve is normal in structure.   CT coronary agniogram 2016  Aorta:  Normal caliber, no calcifications, no dissection.   Aortic Valve:  Trileaflet, no calcifications.   Coronary Arteries: Originating in normal position. Right dominance.   Left main is a large vessel that gives rise to LAS, ramus intermedius and LCX arteries.   LAD is a large caliber vessel that wraps around the apex and gives rise to one diagonal branch. There is a mild calcified plaque in the proximal LAD associated with 25-50% stenosis, the rest of LAD/D1 is free of plaque.   RI is medium caliber vessel free of plaque.   LCX artery is a medium caliber non-dominant vessel that gives rise to one rather small obtuse marginal branch. There is no plaque.   RCA is a large caliber vessel that gives rise to PDA and PLA. There is minimal non-obstructive plaque.   IMPRESSION: 1. Coronary calcium score of 8. This was 23 percentile for age and sex matched control.   2. Normal coronary origin.  Right dominance.   3. Mild non-obstructive calcified plaque in the proximal LAD. An aggressive risk factor modification is recommended.               Wt Readings from Last 3 Encounters:  07/04/22 141 lb (64 kg)  03/13/22 144 lb 3.2 oz (65.4 kg)  02/17/22 141 lb 8 oz (64.2 kg)      ASSESSMENT AND PLAN:  1  Palpitations   The pt has been troubled by these in the past   Now worse   She is on dilt and a b blocker    Spells do not sound long nor are they a cause of any hemodynamic instability   Recomm;  2 Week monitor to document rhythm    Stay active  Stay hydrated   2  CAD  Mild on CT in 2016.    no symptoms of angina   3   HTN  BP is labile   Follow for now   4  HL   Lipids in June   On crestor and Zetia   Follow   LDL 77   HDL 67  Discussed diet       Signed, Dorris Carnes, MD  07/04/2022 8:47 AM    [address], [place], [place]  [address] Phone: [phone]; Fax: [phone]

## 2022-07-04 ENCOUNTER — Ambulatory Visit (INDEPENDENT_AMBULATORY_CARE_PROVIDER_SITE_OTHER): Payer: Medicare Other

## 2022-07-04 ENCOUNTER — Encounter: Payer: Self-pay | Admitting: Internal Medicine

## 2022-07-04 ENCOUNTER — Ambulatory Visit (INDEPENDENT_AMBULATORY_CARE_PROVIDER_SITE_OTHER): Payer: Medicare Other | Admitting: Internal Medicine

## 2022-07-04 VITALS — BP 140/90 | HR 69 | Ht 66.0 in | Wt 141.0 lb

## 2022-07-04 DIAGNOSIS — R002 Palpitations: Secondary | ICD-10-CM

## 2022-07-04 NOTE — Progress Notes (Unsigned)
ZIO XT serial # [id number] from office inventory applied in office.

## 2022-07-04 NOTE — Addendum Note (Signed)
Addended by: Drue Novel I on: 07/04/2022 04:18 PM   Modules accepted: Orders

## 2022-07-04 NOTE — Patient Instructions (Signed)
Medication Instructions:   *If you need a refill on your cardiac medications before your next appointment, please call your pharmacy*   Lab Work:  If you have labs (blood work) drawn today and your tests are completely normal, you will receive your results only by: [facility] (if you have MyChart) OR A paper copy in the mail If you have any lab test that is abnormal or we need to change your treatment, we will call you to review the results.   Testing/Procedures: [facility]- Long Term Monitor Instructions  Your physician has requested you wear a ZIO patch monitor for 14 days.  This is a single patch monitor. [employer] supplies one patch monitor per enrollment. Additional stickers are not available. Please do not apply patch if you will be having a Nuclear Stress Test,  Echocardiogram, Cardiac CT, MRI, or Chest Xray during the period you would be wearing the  monitor. The patch cannot be worn during these tests. You cannot remove and re-apply the  ZIO XT patch monitor.  Your ZIO patch monitor will be mailed 3 day USPS to your address on file. It may take 3-5 days  to receive your monitor after you have been enrolled.  Once you have received your monitor, please review the enclosed instructions. Your monitor  has already been registered assigning a specific monitor serial # to you.  Billing and Patient Assistance Program Information  We have supplied [employer] with any of your insurance information on file for billing purposes. [employer] offers a sliding scale Patient Assistance Program for patients that do not have  insurance, or whose insurance does not completely cover the cost of the ZIO monitor.  You must apply for the Patient Assistance Program to qualify for this discounted rate.  To apply, please call [employer] at [phone], select option 4, select option 2, ask to apply for  Patient Assistance Program. Tiffany Moran will ask your household income, and how many people  are in your  household. They will quote your out-of-pocket cost based on that information.  [employer] will also be able to set up a 16-month interest-free payment plan if needed.  Applying the monitor   Shave hair from upper left chest.  Hold abrader disc by orange tab. Rub abrader in 40 strokes over the upper left chest as  indicated in your monitor instructions.  Clean area with 4 enclosed alcohol pads. Let dry.  Apply patch as indicated in monitor instructions. Patch will be placed under collarbone on left  side of chest with arrow pointing upward.  Rub patch adhesive wings for 2 minutes. Remove white label marked "1". Remove the white  label marked "2". Rub patch adhesive wings for 2 additional minutes.  While looking in a mirror, press and release button in center of patch. A small green light will  flash 3-4 times. This will be your only indicator that the monitor has been turned on.  Do not shower for the first 24 hours. You may shower after the first 24 hours.  Press the button if you feel a symptom. You will hear a small click. Record Date, Time and  Symptom in the Patient Logbook.  When you are ready to remove the patch, follow instructions on the last 2 pages of Patient  Logbook. Stick patch monitor onto the last page of Patient Logbook.  Place Patient Logbook in the blue and white box. Use locking tab on box and tape box closed  securely. The blue and white box has prepaid  postage on it. Please place it in the mailbox as  soon as possible. Your physician should have your test results approximately 7 days after the  monitor has been mailed back to [facility].  Call [facility] at [phone] if you have questions regarding  your ZIO XT patch monitor. Call them immediately if you see an orange light blinking on your  monitor.  If your monitor falls off in less than 4 days, contact our Monitor department at [phone].  If your monitor becomes loose or falls off after  4 days call [employer] at [phone] for  suggestions on securing your monitor    Follow-Up: At [facility], you and your health needs are our priority.  As part of our continuing mission to provide you with exceptional heart care, we have created designated Provider Care Teams.  These Care Teams include your primary Cardiologist (physician) and Advanced Practice Providers (APPs -  Physician Assistants and Nurse Practitioners) who all work together to provide you with the care you need, when you need it.  We recommend signing up for the patient portal called "MyChart".  Sign up information is provided on this After Visit Summary.  MyChart is used to connect with patients for Virtual Visits (Telemedicine).  Patients are able to view lab/test results, encounter notes, upcoming appointments, etc.  Non-urgent messages can be sent to your provider as well.   To learn more about what you can do with MyChart, go to [email or web address].    Important Information About [facility]         Follow-Up: At [facility], you and your health needs are our priority.  As part of our continuing mission to provide you with exceptional heart care, we have created designated Provider Care Teams.  These Care Teams include your primary Cardiologist (physician) and Advanced Practice Providers (APPs -  Physician Assistants and Nurse Practitioners) who all work together to provide you with the care you need, when you need it.  We recommend signing up for the patient portal called "MyChart".  Sign up information is provided on this After Visit Summary.  MyChart is used to connect with patients for Virtual Visits (Telemedicine).  Patients are able to view lab/test results, encounter notes, upcoming appointments, etc.  Non-urgent messages can be sent to your provider as well.   To learn more about what you can do with MyChart, go to [email or web address].   t Information About [employer]

## 2022-07-11 ENCOUNTER — Telehealth: Payer: Self-pay | Admitting: Internal Medicine

## 2022-07-11 NOTE — Telephone Encounter (Signed)
Will call back to schedule AWV with [facility] with [facility].

## 2022-07-24 ENCOUNTER — Ambulatory Visit: Payer: Medicare Other | Admitting: Internal Medicine

## 2022-07-28 ENCOUNTER — Encounter (HOSPITAL_BASED_OUTPATIENT_CLINIC_OR_DEPARTMENT_OTHER): Payer: Self-pay | Admitting: *Deleted

## 2022-08-02 ENCOUNTER — Telehealth: Payer: Self-pay

## 2022-08-02 MED ORDER — DILTIAZEM HCL ER 180 MG PO CP24: 180.0000 mg | ORAL_CAPSULE | Freq: Every day | ORAL | 3 refills | Status: DC

## 2022-08-02 NOTE — Telephone Encounter (Signed)
Set follow up for later this fall

## 2022-08-02 NOTE — Telephone Encounter (Signed)
Appt  made for 11/30 /23 Pt was under the impression an earlier appt was needed for October Will forward to Vivian RN  to see about an earlier appt ./cy

## 2022-08-02 NOTE — Telephone Encounter (Signed)
-----   Message from Goulds, MD sent at 07/30/2022 10:20 PM EDT ----- Sinus rhythm on monitor   Rare PACs,   Short bursts of SVT, longest 16 beats No significant arrhythmia Triggered events correlated with SR with PACs I would reocmm increasing diltiazem to 180 mg   Very mild increase   Keep on metoprolol

## 2022-08-02 NOTE — Telephone Encounter (Signed)
The patient has been notified of the result and verbalized understanding.  All questions (if any) were answered. Antonieta Iba, RN 08/02/2022 12:17 PM  New prescription has been sent in.

## 2022-08-09 ENCOUNTER — Encounter: Payer: Self-pay | Admitting: Emergency Medicine

## 2022-08-09 ENCOUNTER — Ambulatory Visit (INDEPENDENT_AMBULATORY_CARE_PROVIDER_SITE_OTHER): Payer: Medicare Other | Admitting: Emergency Medicine

## 2022-08-09 VITALS — BP 128/72 | HR 67 | Temp 98.2°F | Ht 66.0 in | Wt 144.0 lb

## 2022-08-09 DIAGNOSIS — R0981 Nasal congestion: Secondary | ICD-10-CM | POA: Diagnosis not present

## 2022-08-09 DIAGNOSIS — J0100 Acute maxillary sinusitis, unspecified: Secondary | ICD-10-CM

## 2022-08-09 DIAGNOSIS — J3489 Other specified disorders of nose and nasal sinuses: Secondary | ICD-10-CM | POA: Insufficient documentation

## 2022-08-09 MED ORDER — AMOXICILLIN-POT CLAVULANATE 875-125 MG PO TABS: 1.0000 | ORAL_TABLET | Freq: Two times a day (BID) | ORAL | 0 refills | Status: AC

## 2022-08-09 MED ORDER — PREDNISONE 20 MG PO TABS: 20.0000 mg | ORAL_TABLET | Freq: Every day | ORAL | 0 refills | Status: AC

## 2022-08-09 MED ORDER — AZELASTINE HCL 0.1 % NA SOLN: 1.0000 | Freq: Two times a day (BID) | NASAL | 12 refills | Status: DC

## 2022-08-09 NOTE — Patient Instructions (Signed)

## 2022-08-09 NOTE — Telephone Encounter (Signed)
I called and spoke with the pt and she is worried that she had some low HR's on her monitor but that was typically when she was sleeping at night... she will take the Diltiazem in the mornings.

## 2022-08-09 NOTE — Assessment & Plan Note (Signed)
Continue saline nasal sprays and Flonase.  Stop Atrovent nasal spray and try azelastine nasal spray instead.

## 2022-08-09 NOTE — Assessment & Plan Note (Signed)
May be developing secondary bacterial infection.  3 weeks of symptoms progressively getting worse.  At this point she may be harboring a secondary bacterial infection.  We will start Augmentin 875 mg twice a day for 7 days.

## 2022-08-09 NOTE — Assessment & Plan Note (Addendum)
Took 1 prednisone 20 mg yesterday with some relief. Recommend to continue prednisone 20 mg daily for 5 days. Advised to avoid over-the-counter decongestants and oral antihistamines due to history of paroxysmal atrial tachyarrhythmias. Takes diltiazem 120 mg daily.

## 2022-08-09 NOTE — Progress Notes (Signed)
Tiffany Moran 66 y.o.   Chief Complaint  Patient presents with   nasal drainage    Progressively getting worse     HISTORY OF PRESENT ILLNESS: Acute problem visit today.  Patient of Dr. Billey Gosling. This is a 66 y.o. female complaining of nasal drainage progressively getting worse over the past 3 weeks.  Had some headache last night.  No fever or chills Has used Flonase, nasal sprays, and Atrovent nasal sprays. Has some congestion, hoarse voice, occasional headaches. Denies nosebleeds or any other associated symptoms History of paroxysmal atrial tachycardia.  On Cardizem.  Was recently advised to increase dose from 120 to '180mg'$   but hesitant to do that.  Needs to consult cardiologist.  HPI   Prior to Admission medications   Medication Sig Start Date End Date Taking? Authorizing Provider  ALPRAZolam (XANAX) 0.5 MG tablet TAKE ONE TABLET BY MOUTH EVERY 8 HOURS AS NEEDED FOR ANXIETY 04/06/22  Yes Burns, Claudina Lick, MD  Cholecalciferol (VITAMIN D) 2000 units CAPS Take 2,000 Units by mouth daily.   Yes [provider]  diltiazem (DILACOR XR) 180 MG 24 hr capsule Take 1 capsule (180 mg total) by mouth daily. 08/02/22  Yes Fay Records, MD  ezetimibe (ZETIA) 10 MG tablet Take 0.5 tablets (5 mg total) by mouth daily. 05/31/22  Yes Fay Records, MD  fluticasone [facility]) 50 MCG/ACT nasal spray Place 1 spray into both nostrils daily. PRN   Yes [provider]  levalbuterol (XOPENEX HFA) 45 MCG/ACT inhaler Inhale 1 puff into the lungs every 4 (four) hours as needed for wheezing or shortness of breath. 03/31/21  Yes Burns, Claudina Lick, MD  metoprolol succinate (TOPROL-XL) 25 MG 24 hr tablet TAKE 1 TABLET BY MOUTH IN THE MORNING AND TAKE 1 TABLET AT BEDTIME 06/06/22  Yes Fay Records, MD  estradiol (ESTRACE VAGINAL) 0.1 MG/GM vaginal cream 1 gram pv twice weekly. Patient not taking: Reported on 08/09/2022 08/20/20   Megan Salon, MD  hydrocortisone (ANUSOL-HC) 2.5 % rectal cream Place 1  application rectally 2 (two) times daily. Patient not taking: Reported on 08/09/2022 02/11/21   Marrian Salvage, FNP  loperamide (IMODIUM) 2 MG capsule Take 1 capsule (2 mg total) by mouth 2 (two) times daily as needed for diarrhea or loose stools. Patient not taking: Reported on 08/09/2022 02/14/20   Jaynee Eagles, PA-C  Loteprednol Etabonate ([place] OP) Apply to eye as needed.  Patient not taking: Reported on 08/09/2022    [provider]  promethazine (PHENERGAN) 25 MG suppository Place 1 suppository (25 mg total) rectally every 6 (six) hours as needed for nausea or vomiting. Patient not taking: Reported on 08/09/2022 02/09/22   Isaac Bliss, Rayford Halsted, MD  rosuvastatin (CRESTOR) 20 MG tablet Take 1 tablet (20 mg total) by mouth daily. 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   Pes anserine bursitis 04/21/2014   SI (sacroiliac)  joint dysfunction 04/21/2014   Osteopenia 08/04/2011   Vitamin D deficiency 08/04/2011   History of mitral valve prolapse 07/26/2010   PAROXYSMAL ATRIAL TACHYCARDIA 01/05/2010   Raynaud's syndrome 01/05/2010   Hyperlipidemia 08/17/2009   POLYARTHRITIS 08/17/2009    Past Medical History:  Diagnosis Date   Cardiospasm 1983   felt may be related to MVP   Hyperlipidemia    Hypertension    Kidney cysts 11/2015   cyst on one of her kidneys, no treatment required   Mitral valve  prolapse    PONV (postoperative nausea and vomiting)    teeth chipped   Raynaud disease    Retinal hole of right eye    Shingles 2012   leg   Tachycardia     Past Surgical History:  Procedure Laterality Date   COLONOSCOPY  2007   negatve, [place] ,[place] N/A 08/29/2016   Procedure: DILATATION & CURETTAGE/HYSTEROSCOPY;  Surgeon: Salvadore Dom, MD;  Location: [facility];  Service: Gynecology;  Laterality: N/A;   DILATION AND CURETTAGE OF UTERUS     EXCISION OF SKIN TAG Left 08/29/2016   Procedure: EXCISION OF SKIN TAG left labia majora;  Surgeon: Salvadore Dom, MD;  Location: [facility];  Service: Gynecology;  Laterality: Left;   EXPLORATORY LAPAROTOMY     X2 for pain: fibroid, enlarged oviduct   RETINAL LASER PROCEDURE Right 08/2015   SALPINGECTOMY  2007 or 2008   and fibroid removed   TUBAL LIGATION      Social History   Socioeconomic History   Marital status: Married    Spouse name: Not on file   Number of children: Not on file   Years of education: Not on file   Highest education level: Not on file  Occupational History   Not on file  Tobacco Use   Smoking status: Never   Smokeless tobacco: Never  Vaping Use   Vaping Use: Never used  Substance and Sexual Activity   Alcohol use: No   Drug use: No   Sexual activity: Yes    Birth control/protection: Other-see comments, Post-menopausal    Comment: BTL  Other Topics Concern   Not on file  Social History Narrative   Exercises regularly   Social Determinants of Health   Financial Resource Strain: Not on file  Food Insecurity: Not on file  Transportation Needs: Not on file  Physical Activity: Not on file  Stress: Not on file  Social Connections: Not on file  Intimate Partner Violence: Not on file    Family History  Problem Relation Age of Onset   Heart attack Father 69   Myasthenia gravis Father    Hypertension Father    Hyperlipidemia Father     Osteoporosis Father        due to steroids for Myasthenia Grvis   Leukemia Mother 7   Hyperlipidemia Mother    Hypertension Mother    Hypothyroidism Mother    Prostate cancer Brother        in 18s   Hyperlipidemia Brother    Stroke Maternal Grandfather        in 65s   Healthy Son    Healthy Daughter    Diabetes Neg Hx      Review of Systems  Constitutional: Negative.  Negative for chills, fever and malaise/fatigue.  HENT:  Positive for congestion, sinus pain and sore throat.   Respiratory:  Negative for cough and shortness of breath.   Cardiovascular:  Negative for chest pain and palpitations.  Gastrointestinal:  Negative for abdominal pain, nausea and vomiting.  Genitourinary: Negative.   Skin:  Negative for rash.  Neurological:  Positive for headaches. Negative for dizziness.  All other systems reviewed and are negative.  Today's Vitals   08/09/22 1004  BP: 128/72  Pulse: 67  Temp: 98.2 F (36.8 C)  TempSrc: Oral  SpO2: 98%  Weight: 144 lb (65.3 kg)  Height: '5\' 6"'$  (1.676 m)   Body mass index is 23.24 kg/m.   Physical Exam Vitals reviewed.  Constitutional:      Appearance: Normal appearance.  HENT:     Head: Normocephalic.     Right Ear: Tympanic membrane, ear canal and external ear normal.     Left Ear: Tympanic membrane, ear canal and external ear normal.     Nose: Congestion present.     Mouth/Throat:     Mouth: Mucous membranes are moist.     Pharynx: Oropharynx is clear.  Eyes:     Extraocular Movements: Extraocular movements intact.     Conjunctiva/sclera: Conjunctivae normal.     Pupils: Pupils are equal, round, and reactive to light.  Cardiovascular:     Rate and Rhythm: Normal rate and regular rhythm.     Pulses: Normal pulses.     Heart sounds: Normal heart sounds.  Pulmonary:     Effort: Pulmonary effort is normal.     Breath sounds: Normal breath sounds.  Musculoskeletal:        General: Normal range of motion.     Cervical back: No  tenderness.  Lymphadenopathy:     Cervical: No cervical adenopathy.  Skin:    General: Skin is warm and dry.     Capillary Refill: Capillary refill takes less than 2 seconds.  Neurological:     General: No focal deficit present.     Mental Status: She is alert and oriented to person, place, and time.  Psychiatric:        Mood and Affect: Mood normal.        Behavior: Behavior normal.      ASSESSMENT & PLAN: Problem List Items Addressed This Visit       Respiratory   Acute non-recurrent maxillary sinusitis    May be developing secondary bacterial infection.  3 weeks of symptoms progressively getting worse.  At this point she may be harboring a secondary bacterial infection.  We will start Augmentin 875 mg twice a day for 7 days.      Relevant Medications   amoxicillin-clavulanate (AUGMENTIN) 875-125 MG tablet   azelastine (ASTELIN) 0.1 % nasal spray   predniSONE (DELTASONE) 20 MG tablet   Sinus congestion - Primary    Took 1 prednisone 20 mg yesterday with some relief. Recommend to continue prednisone 20 mg daily for 5 days. Advised to avoid over-the-counter decongestants and oral antihistamines due to history of paroxysmal atrial tachyarrhythmias. Takes diltiazem 120 mg daily.      Relevant Medications   predniSONE (DELTASONE) 20 MG tablet     Other   Nasal and sinus discharge    Continue saline nasal sprays and Flonase.  Stop Atrovent nasal spray and try azelastine nasal spray instead.      Relevant Medications   azelastine (ASTELIN) 0.1 % nasal spray   Patient Instructions  Sinus Infection, Adult A sinus infection is soreness and swelling (inflammation) of your sinuses. Sinuses are hollow spaces in the bones around your face. They are located: Around your eyes. In  the middle of your forehead. Behind your nose. In your cheekbones. Your sinuses and nasal passages are lined with a fluid called mucus. Mucus drains out of your sinuses. Swelling can trap mucus in  your sinuses. This lets germs (bacteria, virus, or fungus) grow, which leads to infection. Most of the time, this condition is caused by a virus. What are the causes? Allergies. Asthma. Germs. Things that block your nose or sinuses. Growths in the nose (nasal polyps). Chemicals or irritants in the air. A fungus. This is rare. What increases the risk? Having a weak body defense system (immune system). Doing a lot of swimming or diving. Using nasal sprays too much. Smoking. What are the signs or symptoms? The main symptoms of this condition are pain and a feeling of pressure around the sinuses. Other symptoms include: Stuffy nose (congestion). This may make it hard to breathe through your nose. Runny nose (drainage). Soreness, swelling, and warmth in the sinuses. A cough that may get worse at night. Being unable to smell and taste. Mucus that collects in the throat or the back of the nose (postnasal drip). This may cause a sore throat or bad breath. Being very tired (fatigued). A fever. How is this diagnosed? Your symptoms. Your medical history. A physical exam. Tests to find out if your condition is short-term (acute) or long-term (chronic). Your doctor may: Check your nose for growths (polyps). Check your sinuses using a tool that has a light on one end (endoscope). Check for allergies or germs. Do imaging tests, such as an MRI or CT scan. How is this treated? Treatment for this condition depends on the cause and whether it is short-term or long-term. If caused by a virus, your symptoms should go away on their own within 10 days. You may be given medicines to relieve symptoms. They include: Medicines that shrink swollen tissue in the nose. A spray that treats swelling of the nostrils. Rinses that help get rid of thick mucus in your nose (nasal saline washes). Medicines that treat allergies (antihistamines). Over-the-counter pain relievers. If caused by bacteria, your doctor  may wait to see if you will get better without treatment. You may be given antibiotic medicine if you have: A very bad infection. A weak body defense system. If caused by growths in the nose, surgery may be needed. Follow these instructions at home: Medicines Take, use, or apply over-the-counter and prescription medicines only as told by your doctor. These may include nasal sprays. If you were prescribed an antibiotic medicine, take it as told by your doctor. Do not stop taking it even if you start to feel better. Hydrate and humidify  Drink enough water to keep your pee (urine) pale yellow. Use a cool mist humidifier to keep the humidity level in your home above 50%. Breathe in steam for 10-15 minutes, 3-4 times a day, or as told by your doctor. You can do this in the bathroom while a hot shower is running. Try not to spend time in cool or dry air. Rest Rest as much as you can. Sleep with your head raised (elevated). Make sure you get enough sleep each night. General instructions  Put a warm, moist washcloth on your face 3-4 times a day, or as often as told by your doctor. Use nasal saline washes as often as told by your doctor. Wash your hands often with soap and water. If you cannot use soap and water, use hand sanitizer. Do not smoke. Avoid being around people who  are smoking (secondhand smoke). Keep all follow-up visits. Contact a doctor if: You have a fever. Your symptoms get worse. Your symptoms do not get better within 10 days. Get help right away if: You have a very bad headache. You cannot stop vomiting. You have very bad pain or swelling around your face or eyes. You have trouble seeing. You feel confused. Your neck is stiff. You have trouble breathing. These symptoms may be an emergency. Get help right away. Call 911. Do not wait to see if the symptoms will go away. Do not drive yourself to the hospital. Summary A sinus infection is swelling of your sinuses.  Sinuses are hollow spaces in the bones around your face. This condition is caused by tissues in your nose that become inflamed or swollen. This traps germs. These can lead to infection. If you were prescribed an antibiotic medicine, take it as told by your doctor. Do not stop taking it even if you start to feel better. Keep all follow-up visits. This information is not intended to replace advice given to you by your health care provider. Make sure you discuss any questions you have with your health care provider. Document Revised: 11/08/2021 Document Reviewed: 11/08/2021 Elsevier Patient Education  2023 [place], MD [facility]

## 2022-08-18 ENCOUNTER — Encounter: Payer: Self-pay | Admitting: Emergency Medicine

## 2022-08-21 NOTE — Telephone Encounter (Signed)
Let it run its course and follow-up with your PCP Dr. Quay Burow.  Thanks.

## 2022-09-15 ENCOUNTER — Encounter (HOSPITAL_BASED_OUTPATIENT_CLINIC_OR_DEPARTMENT_OTHER): Payer: Self-pay | Admitting: Obstetrics & Gynecology

## 2022-09-15 ENCOUNTER — Ambulatory Visit (INDEPENDENT_AMBULATORY_CARE_PROVIDER_SITE_OTHER): Payer: Medicare Other | Admitting: Obstetrics & Gynecology

## 2022-09-15 VITALS — BP 133/64 | HR 68 | Ht 66.0 in | Wt 145.2 lb

## 2022-09-15 DIAGNOSIS — Z780 Asymptomatic menopausal state: Secondary | ICD-10-CM

## 2022-09-15 DIAGNOSIS — R1903 Right lower quadrant abdominal swelling, mass and lump: Secondary | ICD-10-CM

## 2022-09-15 DIAGNOSIS — R140 Abdominal distension (gaseous): Secondary | ICD-10-CM | POA: Diagnosis not present

## 2022-09-15 DIAGNOSIS — Z01419 Encounter for gynecological examination (general) (routine) without abnormal findings: Secondary | ICD-10-CM

## 2022-09-15 DIAGNOSIS — M8580 Other specified disorders of bone density and structure, unspecified site: Secondary | ICD-10-CM

## 2022-09-15 DIAGNOSIS — R1904 Left lower quadrant abdominal swelling, mass and lump: Secondary | ICD-10-CM | POA: Diagnosis not present

## 2022-09-15 DIAGNOSIS — M8588 Other specified disorders of bone density and structure, other site: Secondary | ICD-10-CM | POA: Diagnosis not present

## 2022-09-15 NOTE — Progress Notes (Signed)
66 y.o. [id number] Married White or Caucasian female here for breast and pelvic exam.    Reports she had Covid earlier in the year.  Has some issues with changes in heart rate.  She has been seen by cardiology and wore a cardiac monitor.  Also has noted increased bloating this year.  Stopped her cholesterol medication but this didn't help that much.  She is back on her cholesterol medication.  Did see Dr. Watt Climes this year due to abdominal issues.  Last colonoscopy 2016. He has suggested doing this again.  Denies rectal bleeding.  Has some concerns about this being ovarian in origin.    Denies vaginal bleeding.  Patient's last menstrual period was 06/17/2009 (approximate).          Sexually active: Yes.    H/O STD:  no  Health Maintenance: PCP:  Dr. Quay Burow.  Last wellness appt was 02/2022.  Did blood work at that appt:  yes Vaccines are up to date:  no but declines any additional vaccinations  Colonoscopy:  12/10/2015, follow up 10 years MMG:  07/27/2022 Negative BMD:  06/29/2021  Last pap smear:  09/08/2021 Negative.   H/o abnormal pap smear:  Single remote abnormal pap with normal follow up.   reports that she has never smoked. She has never used smokeless tobacco. She reports that she does not drink alcohol and does not use drugs.  Past Medical History:  Diagnosis Date   Cardiospasm 1983   felt may be related to MVP   Hyperlipidemia    Hypertension    Kidney cysts 11/2015   cyst on one of her kidneys, no treatment required   Mitral valve prolapse    PONV (postoperative nausea and vomiting)    teeth chipped   Raynaud disease    Retinal hole of right eye    Shingles 2012   leg   Tachycardia     Past Surgical History:  Procedure Laterality Date   COLONOSCOPY  2007   negatve, [place] ,[place] N/A 08/29/2016   Procedure: DILATATION & CURETTAGE/HYSTEROSCOPY;  Surgeon: Salvadore Dom, MD;  Location: [facility];  Service: Gynecology;   Laterality: N/A;   DILATION AND CURETTAGE OF UTERUS     EXCISION OF SKIN TAG Left 08/29/2016   Procedure: EXCISION OF SKIN TAG left labia majora;  Surgeon: Salvadore Dom, MD;  Location: [facility];  Service: Gynecology;  Laterality: Left;   EXPLORATORY LAPAROTOMY     X2 for pain: fibroid, enlarged oviduct   RETINAL LASER PROCEDURE Right 08/2015   SALPINGECTOMY  2007 or 2008   and fibroid removed   TUBAL LIGATION      Current Outpatient Medications  Medication Sig Dispense Refill   ALPRAZolam (XANAX) 0.5 MG tablet TAKE ONE TABLET BY MOUTH EVERY 8 HOURS AS NEEDED FOR ANXIETY 15 tablet 1   azelastine (ASTELIN) 0.1 % nasal spray Place 1 spray into both nostrils 2 (two) times daily. Use in each nostril as directed 30 mL 12   Cholecalciferol (VITAMIN D) 2000 units CAPS Take 2,000 Units by mouth daily.     diltiazem (DILACOR XR) 180 MG 24 hr capsule Take 1 capsule (180 mg total) by mouth daily. 90 capsule 3   ezetimibe (ZETIA) 10 MG tablet Take 0.5 tablets (5 mg total) by mouth daily. 90 tablet 3   fluticasone (FLONASE) 50 MCG/ACT nasal spray Place 1 spray into both nostrils daily. PRN     levalbuterol (XOPENEX HFA) 45 MCG/ACT  inhaler Inhale 1 puff into the lungs every 4 (four) hours as needed for wheezing or shortness of breath. 1 each 0   loperamide (IMODIUM) 2 MG capsule Take 1 capsule (2 mg total) by mouth 2 (two) times daily as needed for diarrhea or loose stools. 10 capsule 0   hydrocortisone (ANUSOL-HC) 2.5 % rectal cream Place 1 application rectally 2 (two) times daily. (Patient not taking: Reported on 08/09/2022) 30 g 0   Loteprednol Etabonate (LOTEMAX OP) Apply to eye as needed.  (Patient not taking: Reported on 08/09/2022)     metoprolol succinate (TOPROL-XL) 25 MG 24 hr tablet TAKE 1 TABLET BY MOUTH IN THE MORNING AND TAKE 1 TABLET AT BEDTIME (Patient not taking: Reported on 09/15/2022) 180 tablet 3   promethazine (PHENERGAN) 25 MG suppository Place 1 suppository (25 mg total) rectally  every 6 (six) hours as needed for nausea or vomiting. (Patient not taking: Reported on 08/09/2022) 5 each 1   rosuvastatin (CRESTOR) 20 MG tablet Take 1 tablet (20 mg total) by mouth daily. 90 tablet 3   No current facility-administered medications for this visit.    Family History  Problem Relation Age of Onset   Heart attack Father 32   Myasthenia gravis Father    Hypertension Father    Hyperlipidemia Father    Osteoporosis Father        due to steroids for Myasthenia Grvis   Leukemia Mother 73   Hyperlipidemia Mother    Hypertension Mother    Hypothyroidism Mother    Prostate cancer Brother        in 17s   Hyperlipidemia Brother    Stroke Maternal Grandfather        in 4s   Healthy Son    Healthy Daughter    Diabetes Neg Hx     Review of Systems  Constitutional: Negative.   Genitourinary: Negative.     Exam:   BP 133/64 (BP Location: Right Arm, Patient Position: Sitting, Cuff Size: Large)   Pulse 68   Ht '5\' 6"'$  (1.676 m) Comment: Reported  Wt 145 lb 3.2 oz (65.9 kg)   LMP 06/17/2009 (Approximate)   BMI 23.44 kg/m   Height: '5\' 6"'$  (167.6 cm) (Reported)  General appearance: alert, cooperative and appears stated age Breasts: normal appearance, no masses or tenderness Abdomen: soft, non-tender; bowel sounds normal; no masses,  no organomegaly Lymph nodes: Cervical, supraclavicular, and axillary nodes normal.  No abnormal inguinal nodes palpated Neurologic: Grossly normal  Pelvic: External genitalia:  no lesions              Urethra:  normal appearing urethra with no masses, tenderness or lesions              Bartholins and Skenes: normal                 Vagina: normal appearing vagina with atrophic changes and no discharge, no lesions              Cervix: no lesions              Pap taken: No. Bimanual Exam:  Uterus:  normal size, contour, position, consistency, mobility, non-tender              Adnexa: normal adnexa and no mass, fullness, tenderness                Rectovaginal: Confirms               Anus:  normal  sphincter tone, no lesions  Chaperone, Octaviano Batty, CMA, was present for exam.  Assessment/Plan: 1. Encntr for gyn exam (general) (routine) w/o abn findings - Pap smear neg 2022.  Not obtained today. - Mammogram 07/27/2022 - Colonoscopy 2016, follow up 10 years - Bone mineral density 06/29/2021 - lab work done done with Dr. Quay Burow - vaccines declined  2. Osteopenia, unspecified location - DG BONE DENSITY (DXA); Future  3. Bloating - US PELVIC COMPLETE WITH TRANSVAGINAL; Future  4. Postmenopausal - not using estradiol cream at this time.  No RF needed.

## 2022-09-16 ENCOUNTER — Ambulatory Visit (HOSPITAL_BASED_OUTPATIENT_CLINIC_OR_DEPARTMENT_OTHER)
Admission: RE | Admit: 2022-09-16 | Discharge: 2022-09-16 | Disposition: A | Discharge status: Home / Self Care | Payer: Medicare Other | Source: Ambulatory Visit | Attending: Obstetrics & Gynecology | Admitting: Obstetrics & Gynecology

## 2022-09-16 DIAGNOSIS — R1903 Right lower quadrant abdominal swelling, mass and lump: Secondary | ICD-10-CM | POA: Insufficient documentation

## 2022-09-16 DIAGNOSIS — R140 Abdominal distension (gaseous): Secondary | ICD-10-CM | POA: Diagnosis present

## 2022-09-16 DIAGNOSIS — R1904 Left lower quadrant abdominal swelling, mass and lump: Secondary | ICD-10-CM

## 2022-09-19 ENCOUNTER — Encounter (HOSPITAL_BASED_OUTPATIENT_CLINIC_OR_DEPARTMENT_OTHER): Payer: Self-pay | Admitting: Obstetrics & Gynecology

## 2022-09-19 NOTE — Telephone Encounter (Signed)
Called pt in response to myChart message and with results. Pt c/o enlarged reddened area in groin that is tender. Pt provided with appt for evaluation.

## 2022-09-20 ENCOUNTER — Ambulatory Visit (INDEPENDENT_AMBULATORY_CARE_PROVIDER_SITE_OTHER): Payer: Medicare Other | Admitting: Obstetrics & Gynecology

## 2022-09-20 ENCOUNTER — Encounter (HOSPITAL_BASED_OUTPATIENT_CLINIC_OR_DEPARTMENT_OTHER): Payer: Self-pay | Admitting: Obstetrics & Gynecology

## 2022-09-20 VITALS — BP 129/64 | HR 64 | Wt 147.0 lb

## 2022-09-20 DIAGNOSIS — N764 Abscess of vulva: Secondary | ICD-10-CM

## 2022-09-20 MED ORDER — SULFAMETHOXAZOLE-TRIMETHOPRIM 800-160 MG PO TABS: 1.0000 | ORAL_TABLET | Freq: Two times a day (BID) | ORAL | 0 refills | Status: DC

## 2022-09-20 NOTE — Progress Notes (Signed)
GYNECOLOGY  VISIT  CC:   vulvar lesion that is tender  HPI: 66 y.o. G29P2002 Married White or Caucasian female here for assessment of vulvar lesion that just came up over the past two days.  Is tender and raised.  Right long underwear line so this is quite bothersome.  No fever.  No drainage.   Past Medical History:  Diagnosis Date   Cardiospasm 1983   felt may be related to MVP   Hyperlipidemia    Hypertension    Kidney cysts 11/2015   cyst on one of her kidneys, no treatment required   Mitral valve prolapse    PONV (postoperative nausea and vomiting)    teeth chipped   Raynaud disease    Retinal hole of right eye    Shingles 2012   leg   Tachycardia     MEDS:   Current Outpatient Medications on File Prior to Visit  Medication Sig Dispense Refill   ALPRAZolam (XANAX) 0.5 MG tablet TAKE ONE TABLET BY MOUTH EVERY 8 HOURS AS NEEDED FOR ANXIETY 15 tablet 1   azelastine (ASTELIN) 0.1 % nasal spray Place 1 spray into both nostrils 2 (two) times daily. Use in each nostril as directed 30 mL 12   Cholecalciferol (VITAMIN D) 2000 units CAPS Take 2,000 Units by mouth daily.     diltiazem (DILACOR XR) 180 MG 24 hr capsule Take 1 capsule (180 mg total) by mouth daily. 90 capsule 3   ezetimibe (ZETIA) 10 MG tablet Take 0.5 tablets (5 mg total) by mouth daily. 90 tablet 3   fluticasone (FLONASE) 50 MCG/ACT nasal spray Place 1 spray into both nostrils daily. PRN     levalbuterol (XOPENEX HFA) 45 MCG/ACT inhaler Inhale 1 puff into the lungs every 4 (four) hours as needed for wheezing or shortness of breath. 1 each 0   loperamide (IMODIUM) 2 MG capsule Take 1 capsule (2 mg total) by mouth 2 (two) times daily as needed for diarrhea or loose stools. 10 capsule 0   Loteprednol Etabonate (LOTEMAX OP) Apply to eye as needed.     metoprolol succinate (TOPROL-XL) 25 MG 24 hr tablet TAKE 1 TABLET BY MOUTH IN THE MORNING AND TAKE 1 TABLET AT BEDTIME 180 tablet 3   promethazine (PHENERGAN) 25 MG  suppository Place 1 suppository (25 mg total) rectally every 6 (six) hours as needed for nausea or vomiting. 5 each 1   hydrocortisone (ANUSOL-HC) 2.5 % rectal cream Place 1 application rectally 2 (two) times daily. (Patient not taking: Reported on 09/20/2022) 30 g 0   rosuvastatin (CRESTOR) 20 MG tablet Take 1 tablet (20 mg total) by mouth daily. 90 tablet 3   No current facility-administered medications on file prior to visit.    ALLERGIES: Lipitor [atorvastatin]  SH:  married, non smoker  Review of Systems  Constitutional: Negative.     PHYSICAL EXAMINATION:    BP 129/64 (BP Location: Right Arm, Patient Position: Sitting, Cuff Size: Normal)   Pulse 64   Wt 147 lb (66.7 kg)   LMP 06/17/2009 (Approximate)   BMI 23.73 kg/m     General appearance: alert, cooperative and appears stated age Lymph:  no inguinal LAD noted  Pelvic: External genitalia:  2.5 x 1.5 erythematous and indurated lesion with area in center that is mildly fluctuant              Urethra:  normal appearing urethra with no masses, tenderness or lesions  Bartholins and Skenes: normal                  Recommended opening to help with drainage.  Pt gave verbal consent.  Area cleansed with betadine x 3 and using #11 blade, lesion nicked.  Purulent drainage present.  Lesion compressed to ensure all drainage removed.  Pt tolerated procedure well  Chaperone, Octaviano Batty, CMA, was present for exam.  Assessment/Plan: 1. Vulvar abscess, s/p drainage today that was very simple - bactrim DS bid x 5 days - warm compresses TID recommended - WOUND CULTURE

## 2022-09-21 ENCOUNTER — Ambulatory Visit: Payer: Medicare Other | Admitting: Family Medicine

## 2022-09-25 LAB — WOUND CULTURE: Organism ID, Bacteria: NONE SEEN

## 2022-09-27 ENCOUNTER — Ambulatory Visit: Payer: Medicare Other | Admitting: Internal Medicine

## 2022-09-27 ENCOUNTER — Other Ambulatory Visit: Payer: Self-pay | Admitting: Gastroenterology

## 2022-09-27 DIAGNOSIS — R1084 Generalized abdominal pain: Secondary | ICD-10-CM

## 2022-10-02 ENCOUNTER — Telehealth: Payer: Self-pay | Admitting: Internal Medicine

## 2022-10-02 NOTE — Telephone Encounter (Signed)
LVM for pt to rtn my call to schedule AWV with [facility] call back # [phone] 

## 2022-10-03 ENCOUNTER — Other Ambulatory Visit: Payer: Self-pay | Admitting: Internal Medicine

## 2022-10-03 ENCOUNTER — Ambulatory Visit
Admission: RE | Admit: 2022-10-03 | Discharge: 2022-10-03 | Disposition: A | Discharge status: Home / Self Care | Payer: Medicare Other | Source: Ambulatory Visit | Attending: Gastroenterology | Admitting: Gastroenterology

## 2022-10-03 DIAGNOSIS — R1084 Generalized abdominal pain: Secondary | ICD-10-CM

## 2022-10-03 MED ORDER — IOPAMIDOL (ISOVUE-300) INJECTION 61%
100.0000 mL | Freq: Once | INTRAVENOUS | Status: AC | PRN
  Administered 2022-10-03: 100 mL via INTRAVENOUS

## 2022-10-04 NOTE — Progress Notes (Unsigned)
Cardiology Office Note   Date:  10/05/2022   ID:  Tiffany Moran, DOB 1956-08-21, MRN [id number]  PCP:  Binnie Rail, MD  Cardiologist:   Dorris Carnes, MD   Patinet presents for follow up of palpitations and mild CAD    History of Present Illness: Tiffany Moran is a 66 y.o. female with a history of palpitations (monitor in past showed short bursts of SVT/NSVT) and mild CAD  The pt had an abnormal myoview in the past that lead to a coronary CTA   This showed mild nonobstructive CAD (2015)       The pt has been on metoprolol for palpitations     Omega 3 supplements made symptoms worse    COVID in 2023 made them worse.      Last visit with short burst SVT on monitor I recomm  I slast saw the pt in [place] in July 2023  She wore a monitor after that showed SR with rare PACs, short bursts of SVT, longest 16 beats   I recomm increasing diltiazem  Since seen she continues to feel her heart race at times   Self limited   She is followed in GI by [facility]   Continues to have bloatng, abdominal pressure   He questioned if it was her meds    Outpatient Medications Prior to Visit  Medication Sig Dispense Refill   ALPRAZolam (XANAX) 0.5 MG tablet TAKE 1 TABLET BY MOUTH EVERY 8 HOURS AS NEEDED FOR ANXIETY 15 tablet 0   azelastine (ASTELIN) 0.1 % nasal spray Place 1 spray into both nostrils 2 (two) times daily. Use in each nostril as directed (Patient taking differently: Place 1 spray into both nostrils as needed. Use in each nostril as directed) 30 mL 12   Cholecalciferol (VITAMIN D) 2000 units CAPS Take 2,000 Units by mouth daily.     ezetimibe (ZETIA) 10 MG tablet Take 0.5 tablets (5 mg total) by mouth daily. 90 tablet 3   fluticasone (FLONASE) 50 MCG/ACT nasal spray Place 1 spray into both nostrils daily. PRN     hydrocortisone (ANUSOL-HC) 2.5 % rectal cream Place 1 application rectally 2 (two) times daily. 30 g 0   levalbuterol (XOPENEX HFA) 45 MCG/ACT inhaler Inhale 1 puff into the lungs every 4  (four) hours as needed for wheezing or shortness of breath. 1 each 0   loperamide (IMODIUM) 2 MG capsule Take 1 capsule (2 mg total) by mouth 2 (two) times daily as needed for diarrhea or loose stools. 10 capsule 0   Loteprednol Etabonate (LOTEMAX OP) Apply to eye as needed.     promethazine (PHENERGAN) 25 MG suppository Place 1 suppository (25 mg total) rectally every 6 (six) hours as needed for nausea or vomiting. 5 each 1   diltiazem (DILACOR XR) 180 MG 24 hr capsule Take 1 capsule (180 mg total) by mouth daily. 90 capsule 3   metoprolol succinate (TOPROL-XL) 25 MG 24 hr tablet TAKE 1 TABLET BY MOUTH IN THE MORNING AND TAKE 1 TABLET AT BEDTIME 180 tablet 3   rosuvastatin (CRESTOR) 20 MG tablet Take 1 tablet (20 mg total) by mouth daily. 90 tablet 3   sulfamethoxazole-trimethoprim (BACTRIM DS) 800-160 MG tablet Take 1 tablet by mouth 2 (two) times daily. (Patient not taking: Reported on 10/05/2022) 10 tablet 0   No facility-administered medications prior to visit.     Allergies:   Lipitor [atorvastatin]   Past Medical History:  Diagnosis Date   Cardiospasm 2  felt may be related to MVP   Hyperlipidemia    Hypertension    Kidney cysts 11/2015   cyst on one of her kidneys, no treatment required   Mitral valve prolapse    PONV (postoperative nausea and vomiting)    teeth chipped   Raynaud disease    Retinal hole of right eye    Shingles 2012   leg   Tachycardia     Past Surgical History:  Procedure Laterality Date   COLONOSCOPY  2007   negatve, [place] ,[place] N/A 08/29/2016   Procedure: DILATATION & CURETTAGE/HYSTEROSCOPY;  Surgeon: Salvadore Dom, MD;  Location: [facility];  Service: Gynecology;  Laterality: N/A;   DILATION AND CURETTAGE OF UTERUS     EXCISION OF SKIN TAG Left 08/29/2016   Procedure: EXCISION OF SKIN TAG left labia majora;  Surgeon: Salvadore Dom, MD;  Location: [facility];  Service: Gynecology;   Laterality: Left;   EXPLORATORY LAPAROTOMY     X2 for pain: fibroid, enlarged oviduct   RETINAL LASER PROCEDURE Right 08/2015   SALPINGECTOMY  2007 or 2008   and fibroid removed   TUBAL LIGATION       Social History:  The patient  reports that she has never smoked. She has never used smokeless tobacco. She reports that she does not drink alcohol and does not use drugs.   Family History:  The patient's family history includes Healthy in her daughter and son; Heart attack (age of onset: 34) in her father; Hyperlipidemia in her brother, father, and mother; Hypertension in her father and mother; Hypothyroidism in her mother; Leukemia (age of onset: 70) in her mother; Myasthenia gravis in her father; Osteoporosis in her father; Prostate cancer in her brother; Stroke in her maternal grandfather.    ROS:  Please see the history of present illness. All other systems are reviewed and  Negative to the above problem except as noted.    PHYSICAL EXAM: VS:  BP 130/80   Pulse 60   Ht '5\' 6"'$  (1.676 m)   Wt 145 lb 3.2 oz (65.9 kg)   LMP 06/17/2009 (Approximate)   SpO2 99%   BMI 23.44 kg/m   GEN: Pt is in no acute distress  HEENT: normal  Neck: JVP is norrmal Cardiac: RRR; no murmurs.   No LE edema  Respiratory:  clear to auscultation bilaterally,  GI: soft, nontender, nondistended   MS: no deformity Moving all extremities   Skin: warm and dry, Erythmatous lesions toes  Neuro:  Grossly normal  Psych: euthymic mood, full affect   EKG:  EKG not done  Lipid Panel    Component Value Date/Time   CHOL 198 09/20/2021 0728   TRIG 72 09/20/2021 0728   HDL 76 09/20/2021 0728   CHOLHDL 2.6 09/20/2021 0728   CHOLHDL 2 03/10/2021 0926   VLDL 16.6 03/10/2021 0926   LDLCALC 109 (H) 09/20/2021 0728    ECHO 02/18/19   1. The left ventricle has normal systolic function with an ejection  fraction of 60-65%. The cavity size was normal. Left ventricular diastolic  parameters were normal No evidence of  left ventricular regional wall  motion abnormalities.   2. The right ventricle has normal systolic function. The cavity size was  normal. There is no increase in right ventricular wall thickness. Right  ventricular systolic pressure is normal with an estimated pressure of 29  mmHg.   3. Mild myxomatous mitral valve prolapse.  4. Mild late systolic mitral insufficiency with a centrally-directed jet.   5. The average left ventricular global longitudinal strain is -21.6 %.   6. The aortic valve is normal in structure.   CT coronary agniogram 2016  Aorta:  Normal caliber, no calcifications, no dissection.   Aortic Valve:  Trileaflet, no calcifications.   Coronary Arteries: Originating in normal position. Right dominance.   Left main is a large vessel that gives rise to LAS, ramus intermedius and LCX arteries.   LAD is a large caliber vessel that wraps around the apex and gives rise to one diagonal branch. There is a mild calcified plaque in the proximal LAD associated with 25-50% stenosis, the rest of LAD/D1 is free of plaque.   RI is medium caliber vessel free of plaque.   LCX artery is a medium caliber non-dominant vessel that gives rise to one rather small obtuse marginal branch. There is no plaque.   RCA is a large caliber vessel that gives rise to PDA and PLA. There is minimal non-obstructive plaque.   IMPRESSION: 1. Coronary calcium score of 8. This was 60 percentile for age and sex matched control.   2. Normal coronary origin.  Right dominance.   3. Mild non-obstructive calcified plaque in the proximal LAD. An aggressive risk factor modification is recommended.               Wt Readings from Last 3 Encounters:  10/05/22 145 lb 3.2 oz (65.9 kg)  09/20/22 147 lb (66.7 kg)  09/15/22 145 lb 3.2 oz (65.9 kg)      ASSESSMENT AND PLAN:  1  Palpitations  With bloating will switch her to metoprolol 50 bid   Follow  Stop diltiazem   MyChart with response    2   CAD  Mild in 2016 by CT scan    CT now showed mild atherosclerosisi   Risk factor modify  3  HTN  BP OK on current regimen   Follow with changes   4 HL  Will get lipoomed today   Pt on Crestor and 1/2 of Zetia    5  GI  Pt with long hx of bloating    Discussed diet   Prebiotics   Try elim foods, apples for example   Follow symptoms   Making switch to b blocker as noted above   Follow up in spring   Signed, Edyth Glomb, MD  10/05/2022 10:06 AM    [address], [place], [place]  [address] Phone: [phone]; Fax: [phone]

## 2022-10-05 ENCOUNTER — Ambulatory Visit: Payer: Medicare Other | Attending: Internal Medicine | Admitting: Internal Medicine

## 2022-10-05 ENCOUNTER — Encounter: Payer: Self-pay | Admitting: Internal Medicine

## 2022-10-05 VITALS — BP 130/80 | HR 60 | Ht 66.0 in | Wt 145.2 lb

## 2022-10-05 DIAGNOSIS — E785 Hyperlipidemia, unspecified: Secondary | ICD-10-CM

## 2022-10-05 DIAGNOSIS — Z79899 Other long term (current) drug therapy: Secondary | ICD-10-CM

## 2022-10-05 MED ORDER — METOPROLOL SUCCINATE ER 50 MG PO TB24: ORAL_TABLET | ORAL | 3 refills | Status: DC

## 2022-10-05 NOTE — Patient Instructions (Signed)
Medication Instructions:  Stop Diltiazem  Increase Toprol XL to 50 mg twice a day   *If you need a refill on your cardiac medications before your next appointment, please call your pharmacy*   Lab Work: NMR, HGBA1C, HEPATIC  If you have labs (blood work) drawn today and your tests are completely normal, you will receive your results only by: [place] (if you have MyChart) OR A paper copy in the mail If you have any lab test that is abnormal or we need to change your treatment, we will call you to review the results.   Testing/Procedures:    Follow-Up: At [facility], you and your health needs are our priority.  As part of our continuing mission to provide you with exceptional heart care, we have created designated Provider Care Teams.  These Care Teams include your primary Cardiologist (physician) and Advanced Practice Providers (APPs -  Physician Assistants and Nurse Practitioners) who all work together to provide you with the care you need, when you need it.  We recommend signing up for the patient portal called "MyChart".  Sign up information is provided on this After Visit Summary.  MyChart is used to connect with patients for Virtual Visits (Telemedicine).  Patients are able to view lab/test results, encounter notes, upcoming appointments, etc.  Non-urgent messages can be sent to your provider as well.   To learn more about what you can do with MyChart, go to [email or web address].    Your next appointment:   6 month(s)  The format for your next appointment:   In Person  Provider:   Dorris Carnes, MD     Other Instructions   Important Information About Sugar

## 2022-10-06 LAB — NMR, LIPOPROFILE
Cholesterol, Total: 155 mg/dL (ref 100–199)
HDL Particle Number: 39.8 umol/L (ref >=30.5)
HDL-C: 79 mg/dL (ref >39)
LDL Particle Number: 533 nmol/L (ref <1000)
LDL Size: 20.9 nm (ref >20.5)
LDL-C (NIH Calc): 61 mg/dL (ref 0–99)
LP-IR Score: <25 (ref <=45)
Small LDL Particle Number: <90 nmol/L (ref <=527)
Triglycerides: 80 mg/dL (ref 0–149)

## 2022-10-06 LAB — HEPATIC FUNCTION PANEL
ALT: 38 IU/L — ABNORMAL HIGH (ref 0–32)
AST: 28 IU/L (ref 0–40)
Albumin: 5.1 g/dL — ABNORMAL HIGH (ref 3.9–4.9)
Alkaline Phosphatase: 93 IU/L (ref 44–121)
Bilirubin Total: 0.6 mg/dL (ref 0.0–1.2)
Bilirubin, Direct: 0.19 mg/dL (ref 0.00–0.40)
Total Protein: 7.1 g/dL (ref 6.0–8.5)

## 2022-10-06 LAB — HEMOGLOBIN A1C
Est. average glucose Bld gHb Est-mCnc: 114 mg/dL
Hgb A1c MFr Bld: 5.6 % (ref 4.8–5.6)

## 2022-10-09 ENCOUNTER — Telehealth: Payer: Self-pay | Admitting: Internal Medicine

## 2022-10-09 DIAGNOSIS — R748 Abnormal levels of other serum enzymes: Secondary | ICD-10-CM

## 2022-10-09 NOTE — Telephone Encounter (Signed)
Pt stating she was told by Alden Hipp, RN to sch labs for lipid panel in 1 month, pt needs new lab orders

## 2022-10-09 NOTE — Telephone Encounter (Signed)
LM for the pt to My Chart me a date she can come back in one month for her repeat Hepatic Panel.

## 2022-10-11 MED ORDER — METOPROLOL SUCCINATE ER 50 MG PO TB24: ORAL_TABLET | ORAL | 3 refills | Status: DC

## 2022-10-11 NOTE — Telephone Encounter (Signed)
I spoke with the pt and she reports that she is still having some palpitations at night that wakes her up... she is feeling better during the day... less bloating and more energy... she is asking if she can take PRN metoprolol for nights that she has the palpitations but she is complaining of them nightly... I advised her that we may need to change her night dose... I will forward to Dr Harrington Challenger for her review and recommendations.

## 2022-10-11 NOTE — Telephone Encounter (Signed)
LM with the pts husband to please call our office back.

## 2022-10-11 NOTE — Telephone Encounter (Signed)
May take 1 1/2 at night  Keep 1 in morning

## 2022-10-16 ENCOUNTER — Telehealth: Payer: Self-pay | Admitting: Internal Medicine

## 2022-10-16 MED ORDER — DILTIAZEM HCL ER COATED BEADS 120 MG PO CP24: 120.0000 mg | ORAL_CAPSULE | Freq: Every day | ORAL | 3 refills | Status: DC

## 2022-10-16 MED ORDER — METOPROLOL SUCCINATE ER 25 MG PO TB24: 25.0000 mg | ORAL_TABLET | Freq: Two times a day (BID) | ORAL | 3 refills | Status: DC

## 2022-10-16 NOTE — Telephone Encounter (Signed)
Pt is asking to go back on all of her old dosing... she is not tolerating the new med plan... she is very agitated and nervous and just wants to go back to the way it was...   She will plan to go back to Metoprolol 25 mg BID and the Diltiazem 120 mg daily..she says this is when she felt like she was at her best.   I will forward to Dr Harrington Challenger to review.

## 2022-10-16 NOTE — Telephone Encounter (Signed)
Pt is requesting call back in regards to the MyChart message she left today in the encounter from 10/23. She states she is still having issues she believes caused by change in medication and would like a call back from [place], [place].

## 2022-10-16 NOTE — Telephone Encounter (Signed)
Spoke with the pt..See phone note.

## 2022-10-16 NOTE — Telephone Encounter (Signed)
OK with going back to what she was on

## 2022-10-18 ENCOUNTER — Encounter: Payer: Self-pay | Admitting: Internal Medicine

## 2022-10-18 NOTE — Telephone Encounter (Signed)
I called the pt and she is going to try and send [place] the entire strip from her apple watch... from last night and this morning.

## 2022-10-24 NOTE — Telephone Encounter (Signed)
Patient is having very few skips on monitor     Stay hydrated  Stay on same meds

## 2022-10-26 ENCOUNTER — Encounter: Payer: Self-pay | Admitting: Internal Medicine

## 2022-10-26 ENCOUNTER — Telehealth: Payer: Self-pay | Admitting: Internal Medicine

## 2022-10-26 NOTE — Telephone Encounter (Signed)
Pt is requesting call back in regard to her MyChart messages. She states that she has an increase in heartbeats and would like to discuss.

## 2022-10-26 NOTE — Progress Notes (Unsigned)
Subjective:    Patient ID: Tiffany Moran, female    DOB: [birth year], 66 y.o.   MRN: [id number]     HPI Diala is here for follow up of her chronic medical problems, including anxiety, htn, hld, osteopenia  Here to review medication  Bloating for a while - saw gyn - ovaries ok  no gyn cause.  She advised seeing GI - all was good.  Has 3 mm left kidney stone.    Saw cardiology - dr Sarina Ser mentioned one heart med may be causing constipation.  She took her off the diltiazem and started her on increased dose metoprolol ( felt nervous, early beats).  Then went back to diltiazem and lower dose of metoprolol.  Continue to have early beats - taking 1/2 xanax - not helping.  Has increased her xanax intake and that is concerning to  her.     Running extremely hot and cold - never had that before.  Will have chills.  No sweats.    Took 15000 step yesterday.  Does not fel the extra beats w/ exercise.  Eating good diet.   Dr Harrington Challenger advised 50 mg of metoprolol at night - first night was last night.   She knows her anxiety is likely contributing.   Medications and allergies reviewed with patient and updated if appropriate.  Current Outpatient Medications on File Prior to Visit  Medication Sig Dispense Refill   ALPRAZolam (XANAX) 0.5 MG tablet TAKE 1 TABLET BY MOUTH EVERY 8 HOURS AS NEEDED FOR ANXIETY 15 tablet 0   azelastine (ASTELIN) 0.1 % nasal spray Place 1 spray into both nostrils 2 (two) times daily. Use in each nostril as directed (Patient taking differently: Place 1 spray into both nostrils as needed. Use in each nostril as directed) 30 mL 12   Cholecalciferol (VITAMIN D) 2000 units CAPS Take 2,000 Units by mouth daily.     diltiazem (CARDIZEM CD) 120 MG 24 hr capsule Take 1 capsule (120 mg total) by mouth daily. 90 capsule 3   ezetimibe (ZETIA) 10 MG tablet Take 0.5 tablets (5 mg total) by mouth daily. 90 tablet 3   fluticasone (FLONASE) 50 MCG/ACT nasal spray Place 1 spray into both  nostrils daily. PRN     hydrocortisone (ANUSOL-HC) 2.5 % rectal cream Place 1 application rectally 2 (two) times daily. (Patient taking differently: Place 1 application  rectally 2 (two) times daily. PRN) 30 g 0   levalbuterol (XOPENEX HFA) 45 MCG/ACT inhaler Inhale 1 puff into the lungs every 4 (four) hours as needed for wheezing or shortness of breath. 1 each 0   Loteprednol Etabonate (LOTEMAX OP) Apply to eye as needed.     metoprolol succinate (TOPROL-XL) 25 MG 24 hr tablet Take 1 tablet (25 mg total) by mouth 2 (two) times daily. 180 tablet 3   promethazine (PHENERGAN) 25 MG suppository Place 1 suppository (25 mg total) rectally every 6 (six) hours as needed for nausea or vomiting. 5 each 1   rosuvastatin (CRESTOR) 20 MG tablet Take 1 tablet (20 mg total) by mouth daily. 90 tablet 3   No current facility-administered medications on file prior to visit.     Review of Systems  Constitutional:  Positive for chills. Negative for fever.  HENT:  Negative for congestion, ear pain, sinus pain and sore throat.   Respiratory:  Negative for cough, shortness of breath and wheezing.   Cardiovascular:  Positive for palpitations (extra beats). Negative for chest pain.  Neurological:  Positive for dizziness (slight yesterday - woozy headed) and light-headedness. Negative for headaches.       Objective:   Vitals:   10/27/22 0918  BP: 128/78  Pulse: 70  Temp: 98 F (36.7 C)  SpO2: 99%   BP Readings from Last 3 Encounters:  10/27/22 128/78  10/05/22 130/80  09/20/22 129/64   Wt Readings from Last 3 Encounters:  10/27/22 143 lb (64.9 kg)  10/05/22 145 lb 3.2 oz (65.9 kg)  09/20/22 147 lb (66.7 kg)   Body mass index is 23.08 kg/m.    Physical Exam Constitutional:      General: She is not in acute distress.    Appearance: Normal appearance.  HENT:     Head: Normocephalic and atraumatic.  Eyes:     Conjunctiva/sclera: Conjunctivae normal.  Cardiovascular:     Rate and Rhythm:  Normal rate and regular rhythm.     Heart sounds: Normal heart sounds. No murmur heard. Pulmonary:     Effort: Pulmonary effort is normal. No respiratory distress.     Breath sounds: Normal breath sounds. No wheezing.  Musculoskeletal:     Cervical back: Neck supple.     Right lower leg: No edema.     Left lower leg: No edema.  Lymphadenopathy:     Cervical: No cervical adenopathy.  Skin:    General: Skin is warm and dry.     Findings: No rash.  Neurological:     Mental Status: She is alert. Mental status is at baseline.  Psychiatric:        Mood and Affect: Mood normal.        Behavior: Behavior normal.        Lab Results  Component Value Date   WBC 4.9 04/18/2022   HGB 13.7 04/18/2022   HCT 40.5 04/18/2022   PLT 159.0 04/18/2022   GLUCOSE 93 03/13/2022   CHOL 198 09/20/2021   TRIG 72 09/20/2021   HDL 76 09/20/2021   LDLCALC 109 (H) 09/20/2021   ALT 38 (H) 10/05/2022   AST 28 10/05/2022   NA 140 03/13/2022   K 4.3 03/13/2022   CL 104 03/13/2022   CREATININE 0.86 03/13/2022   BUN 14 03/13/2022   CO2 32 03/13/2022   TSH 2.90 03/13/2022   HGBA1C 5.6 10/05/2022     Assessment & Plan:    See Problem List for Assessment and Plan of chronic medical problems.

## 2022-10-26 NOTE — Telephone Encounter (Signed)
Spoke with patient who states since changing medications she is having increased early heart beats. Reports trying to stay active but not feeling well with palpitations and increase anxiety. We reviewed her current medications and patient states she has been instructed to take an extra dose of metoprolol 25 mg at night as needed which she has not been doing consistently.   Advised to take an extra dose nightly for several nights and monitor for improvement. She is scheduled with Dr. Harrington Challenger on 11/01/2022 for follow up.  Patient agreed to this plan.  Advised if she develops worsening symptoms, SOB, Chest pain to go to he ED for evaluation. Patient agreed to this.

## 2022-10-27 ENCOUNTER — Encounter: Payer: Self-pay | Admitting: Internal Medicine

## 2022-10-27 ENCOUNTER — Ambulatory Visit (INDEPENDENT_AMBULATORY_CARE_PROVIDER_SITE_OTHER): Payer: Medicare Other | Admitting: Internal Medicine

## 2022-10-27 ENCOUNTER — Other Ambulatory Visit: Payer: Medicare Other

## 2022-10-27 VITALS — BP 128/78 | HR 70 | Temp 98.0°F | Ht 66.0 in | Wt 143.0 lb

## 2022-10-27 DIAGNOSIS — F419 Anxiety disorder, unspecified: Secondary | ICD-10-CM | POA: Diagnosis not present

## 2022-10-27 DIAGNOSIS — I471 Supraventricular tachycardia, unspecified: Secondary | ICD-10-CM | POA: Diagnosis not present

## 2022-10-27 LAB — COMPREHENSIVE METABOLIC PANEL
ALT: 40 U/L — ABNORMAL HIGH (ref 0–35)
AST: 26 U/L (ref 0–37)
Albumin: 4.6 g/dL (ref 3.5–5.2)
Alkaline Phosphatase: 80 U/L (ref 39–117)
BUN: 14 mg/dL (ref 6–23)
CO2: 31 mEq/L (ref 19–32)
Calcium: 9.5 mg/dL (ref 8.4–10.5)
Chloride: 104 mEq/L (ref 96–112)
Creatinine, Ser: 0.86 mg/dL (ref 0.40–1.20)
GFR: 70.1 mL/min (ref >60.00)
Glucose, Bld: 91 mg/dL (ref 70–99)
Potassium: 4 mEq/L (ref 3.5–5.1)
Sodium: 140 mEq/L (ref 135–145)
Total Bilirubin: 0.8 mg/dL (ref 0.2–1.2)
Total Protein: 7.1 g/dL (ref 6.0–8.3)

## 2022-10-27 LAB — CBC WITH DIFFERENTIAL/PLATELET
Basophils Absolute: 0 10*3/uL (ref 0.0–0.1)
Basophils Relative: 0.6 % (ref 0.0–3.0)
Eosinophils Absolute: 0.1 10*3/uL (ref 0.0–0.7)
Eosinophils Relative: 1.1 % (ref 0.0–5.0)
HCT: 42.4 % (ref 36.0–46.0)
Hemoglobin: 14.2 g/dL (ref 12.0–15.0)
Lymphocytes Relative: 23.2 % (ref 12.0–46.0)
Lymphs Abs: 1.1 10*3/uL (ref 0.7–4.0)
MCHC: 33.4 g/dL (ref 30.0–36.0)
MCV: 90.9 fl (ref 78.0–100.0)
Monocytes Absolute: 0.3 10*3/uL (ref 0.1–1.0)
Monocytes Relative: 6 % (ref 3.0–12.0)
Neutro Abs: 3.3 10*3/uL (ref 1.4–7.7)
Neutrophils Relative %: 69.1 % (ref 43.0–77.0)
Platelets: 154 10*3/uL (ref 150.0–400.0)
RBC: 4.66 Mil/uL (ref 3.87–5.11)
RDW: 13.3 % (ref 11.5–15.5)
WBC: 4.8 10*3/uL (ref 4.0–10.5)

## 2022-10-27 LAB — TSH: TSH: 1.71 u[IU]/mL (ref 0.35–5.50)

## 2022-10-27 LAB — MAGNESIUM: Magnesium: 2.1 mg/dL (ref 1.5–2.5)

## 2022-10-27 NOTE — Patient Instructions (Addendum)
   Consider acupuncture, trying the Ashwagandha for anxiety, consider seeing a therapist  -     [email or web address]  [facility] [address], [address]. [place],  [address]  [phone]   An EKG was done today   Blood work was ordered.   The lab is on the first floor.   Medications changes include :   none

## 2022-10-28 NOTE — Assessment & Plan Note (Signed)
Chronic Managed by cardiology-Dr. Harrington Challenger Was fairly well controlled, but diltiazem was stopped for concerns it was causing some of her constipation her metoprolol was increased, but extra beats/palpitations increased-restarted diltiazem Still having extra beats associated with increased anxiety Medication management per Dr. Ross-metoprolol dose at night increased Anxiety is influencing some of her symptoms-we will try to better controlled at We will check CBC, CMP and TSH although expect them to be normal EKG today shows normal sinus rhythm at 67 bpm, prior septal infarct, otherwise normal EKG.  This is unchanged from her previous EKG 07/04/2022

## 2022-10-28 NOTE — Assessment & Plan Note (Addendum)
Chronic Has intermittent anxiety and has been doing well with the Xanax 0.25-0.5 mg daily as needed-was never taken on a daily basis Has been taking more recently secondary to increased anxiety mostly related to increased palpitations, PACs Deferred SSRI Discussed supplements that may help Discussed acupuncture Blood work, EKG hopefully will serve as a reassurance for sure that nothing else is going on We will continue Xanax 0.5 mg daily as needed, but discussed I do not want her taking this on a daily basis

## 2022-11-01 ENCOUNTER — Encounter: Payer: Self-pay | Admitting: Internal Medicine

## 2022-11-01 ENCOUNTER — Ambulatory Visit: Payer: Medicare Other | Attending: Internal Medicine | Admitting: Internal Medicine

## 2022-11-01 VITALS — BP 132/62 | HR 65 | Ht 66.0 in | Wt 144.0 lb

## 2022-11-01 DIAGNOSIS — R002 Palpitations: Secondary | ICD-10-CM | POA: Insufficient documentation

## 2022-11-01 MED ORDER — DILTIAZEM HCL 30 MG PO TABS: 30.0000 mg | ORAL_TABLET | Freq: Every day | ORAL | 3 refills | Status: DC

## 2022-11-01 NOTE — Progress Notes (Signed)
Cardiology Office Note   Date:  11/01/2022   ID:  Tiffany Moran, DOB March 06, 1956, MRN [id number]  PCP:  Binnie Rail, MD  Cardiologist:   Dorris Carnes, MD   P[place]et presents for follow up of palpitations and mild CAD    History of Present Illness: Tiffany Moran is a 66 y.o. female with a history of palpitations (monitor in past showed short bursts of SVT/NSVT) and mild CAD  The pt had an abnormal myoview in the past that lead to a coronary CTA   This showed mild nonobstructive CAD (2015)       The pt has been on metoprolol for palpitations     Omega 3 supplements made symptoms worse    COVID in 2023 made them worse.      Last visit with short burst SVT on monitor I recomm  [place] slast [place]  She wore a monitor after that showed SR with rare PACs, short bursts of SVT, longest 16 beats   I recomm increasing diltiazem When I saw her in [place] I recomm she stop diltiazem given some symptoms of bloating   I also recomm she go up on metoprolol    She did this and felt Bad   Had worsing palpitations     Went back to previous regimen     She  still does not feel right   Will feel HR be irregular   has strips on her apple watch   SR with pacs  Active   Walks 11,000 steps per day  Outpatient Medications Prior to Visit  Medication Sig Dispense Refill   ALPRAZolam (XANAX) 0.5 MG tablet TAKE 1 TABLET BY MOUTH EVERY 8 HOURS AS NEEDED FOR ANXIETY 15 tablet 0   azelastine (ASTELIN) 0.1 % nasal spray Place 1 spray into both nostrils 2 (two) times daily. Use in each nostril as directed (Patient taking differently: Place 1 spray into both nostrils as needed. Use in each nostril as directed) 30 mL 12   Cholecalciferol (VITAMIN D) 2000 units CAPS Take 2,000 Units by mouth daily.     diltiazem (CARDIZEM CD) 120 MG 24 hr capsule Take 1 capsule (120 mg total) by mouth daily. 90 capsule 3   ezetimibe (ZETIA) 10 MG tablet Take 0.5 tablets (5 mg total) by mouth daily. 90 tablet 3    fluticasone (FLONASE) 50 MCG/ACT nasal spray Place 1 spray into both nostrils daily. PRN     hydrocortisone (ANUSOL-HC) 2.5 % rectal cream Place 1 application rectally 2 (two) times daily. (Patient taking differently: Place 1 application  rectally 2 (two) times daily. PRN) 30 g 0   levalbuterol (XOPENEX HFA) 45 MCG/ACT inhaler Inhale 1 puff into the lungs every 4 (four) hours as needed for wheezing or shortness of breath. 1 each 0   Loteprednol Etabonate (LOTEMAX OP) Apply to eye as needed.     metoprolol succinate (TOPROL-XL) 25 MG 24 hr tablet Take 1 tablet (25 mg total) by mouth 2 (two) times daily. 180 tablet 3   promethazine (PHENERGAN) 25 MG suppository Place 1 suppository (25 mg total) rectally every 6 (six) hours as needed for nausea or vomiting. 5 each 1   rosuvastatin (CRESTOR) 20 MG tablet Take 1 tablet (20 mg total) by mouth daily. 90 tablet 3   No facility-administered medications prior to visit.     Allergies:   Lipitor [atorvastatin]   Past Medical History:  Diagnosis Date   Cardiospasm 66  felt may be related to MVP   Hyperlipidemia    Hypertension    Kidney cysts 11/2015   cyst on one of her kidneys, no treatment required   Mitral valve prolapse    PONV (postoperative nausea and vomiting)    teeth chipped   Raynaud disease    Retinal hole of right eye    Shingles 2012   leg   Tachycardia     Past Surgical History:  Procedure Laterality Date   COLONOSCOPY  2007   negatve, [place] ,[place] N/A 08/29/2016   Procedure: DILATATION & CURETTAGE/HYSTEROSCOPY;  Surgeon: Salvadore Dom, MD;  Location: [facility];  Service: Gynecology;  Laterality: N/A;   DILATION AND CURETTAGE OF UTERUS     EXCISION OF SKIN TAG Left 08/29/2016   Procedure: EXCISION OF SKIN TAG left labia majora;  Surgeon: Salvadore Dom, MD;  Location: [facility];  Service: Gynecology;  Laterality: Left;   EXPLORATORY LAPAROTOMY     X2 for pain:  fibroid, enlarged oviduct   RETINAL LASER PROCEDURE Right 08/2015   SALPINGECTOMY  2007 or 2008   and fibroid removed   TUBAL LIGATION       Social History:  The patient  reports that she has never smoked. She has never used smokeless tobacco. She reports that she does not drink alcohol and does not use drugs.   Family History:  The patient's family history includes Healthy in her daughter and son; Heart attack (age of onset: 80) in her father; Hyperlipidemia in her brother, father, and mother; Hypertension in her father and mother; Hypothyroidism in her mother; Leukemia (age of onset: 37) in her mother; Myasthenia gravis in her father; Osteoporosis in her father; Prostate cancer in her brother; Stroke in her maternal grandfather.    ROS:  Please see the history of present illness. All other systems are reviewed and  Negative to the above problem except as noted.    PHYSICAL EXAM: VS:  BP 132/62   Pulse 65   Ht '5\' 6"'$  (1.676 m)   Wt 144 lb (65.3 kg)   LMP 06/17/2009 (Approximate)   SpO2 97%   BMI 23.24 kg/m   GEN: Pt is in no acute distress  HEENT: normal  Neck: JVP is not elevated  Cardiac: RRR; no murmurs.   No LE edema  Respiratory:  clear to auscultation bilaterally,  GI: soft, nontender, nondistended   MS: no deformity Moving all extremities   Skin: warm and dry, Erythmatous lesions toes  Neuro:  Grossly normal  Psych: euthymic mood, full affect   EKG:  EKG not done  Lipid Panel    Component Value Date/Time   CHOL 198 09/20/2021 0728   TRIG 72 09/20/2021 0728   HDL 76 09/20/2021 0728   CHOLHDL 2.6 09/20/2021 0728   CHOLHDL 2 03/10/2021 0926   VLDL 16.6 03/10/2021 0926   LDLCALC 109 (H) 09/20/2021 0728    ECHO 02/18/19   1. The left ventricle has normal systolic function with an ejection  fraction of 60-65%. The cavity size was normal. Left ventricular diastolic  parameters were normal No evidence of left ventricular regional wall  motion abnormalities.   2. The  right ventricle has normal systolic function. The cavity size was  normal. There is no increase in right ventricular wall thickness. Right  ventricular systolic pressure is normal with an estimated pressure of 29  mmHg.   3. Mild myxomatous mitral valve prolapse.  4. Mild late systolic mitral insufficiency with a centrally-directed jet.   5. The average left ventricular global longitudinal strain is -21.6 %.   6. The aortic valve is normal in structure.   CT coronary agniogram 2016  Aorta:  Normal caliber, no calcifications, no dissection.   Aortic Valve:  Trileaflet, no calcifications.   Coronary Arteries: Originating in normal position. Right dominance.   Left main is a large vessel that gives rise to LAS, ramus intermedius and LCX arteries.   LAD is a large caliber vessel that wraps around the apex and gives rise to one diagonal branch. There is a mild calcified plaque in the proximal LAD associated with 25-50% stenosis, the rest of LAD/D1 is free of plaque.   RI is medium caliber vessel free of plaque.   LCX artery is a medium caliber non-dominant vessel that gives rise to one rather small obtuse marginal branch. There is no plaque.   RCA is a large caliber vessel that gives rise to PDA and PLA. There is minimal non-obstructive plaque.   IMPRESSION: 1. Coronary calcium score of 8. This was 24 percentile for age and sex matched control.   2. Normal coronary origin.  Right dominance.   3. Mild non-obstructive calcified plaque in the proximal LAD. An aggressive risk factor modification is recommended.               Wt Readings from Last 3 Encounters:  11/01/22 144 lb (65.3 kg)  10/27/22 143 lb (64.9 kg)  10/05/22 145 lb 3.2 oz (65.9 kg)      ASSESSMENT AND PLAN:  1  Palpitations  Pt still feels skips   No significant arrhythmias on monitor    She is very sensitive  Recomm continuing current regimen    Add dilt short acting 30 mg at noon    Stay hydrated       Keep active 2  CAD  Mild in 2016 by CT scan    CT now showed mild atherosclerosisi   Risk factor modify  3  HTN  BP OK on current regimen   F  4 HL  LDL very good 61      5  GI  Follows with [employer], Dorris Carnes, MD  11/01/2022 2:38 PM    [address], [place], [place]  [address] Phone: [phone]; Fax: [phone]

## 2022-11-01 NOTE — Patient Instructions (Signed)
Medication Instructions:  ADD DILTIAZEM 30 MG DAILY  *If you need a refill on your cardiac medications before your next appointment, please call your pharmacy*   Lab Work:  If you have labs (blood work) drawn today and your tests are completely normal, you will receive your results only by: [place] (if you have MyChart) OR A paper copy in the mail If you have any lab test that is abnormal or we need to change your treatment, we will call you to review the results.   Testing/Procedures:    Follow-Up: At [facility], you and your health needs are our priority.  As part of our continuing mission to provide you with exceptional heart care, we have created designated Provider Care Teams.  These Care Teams include your primary Cardiologist (physician) and Advanced Practice Providers (APPs -  Physician Assistants and Nurse Practitioners) who all work together to provide you with the care you need, when you need it.  We recommend signing up for the patient portal called "MyChart".  Sign up information is provided on this After Visit Summary.  MyChart is used to connect with patients for Virtual Visits (Telemedicine).  Patients are able to view lab/test results, encounter notes, upcoming appointments, etc.  Non-urgent messages can be sent to your provider as well.   To learn more about what you can do with MyChart, go to [email or web address].     Important Information About Sugar

## 2022-11-03 ENCOUNTER — Encounter: Payer: Self-pay | Admitting: Internal Medicine

## 2022-11-03 ENCOUNTER — Ambulatory Visit: Payer: Medicare Other

## 2022-11-03 NOTE — Telephone Encounter (Signed)
Submit to find insurance coverage for 2 wk monitor A

## 2022-11-06 ENCOUNTER — Telehealth: Payer: Self-pay | Admitting: Internal Medicine

## 2022-11-06 DIAGNOSIS — R002 Palpitations: Secondary | ICD-10-CM

## 2022-11-06 MED ORDER — METOPROLOL SUCCINATE ER 25 MG PO TB24: 25.0000 mg | ORAL_TABLET | Freq: Two times a day (BID) | ORAL | 3 refills | Status: DC

## 2022-11-06 MED ORDER — METOPROLOL SUCCINATE ER 25 MG PO TB24: 25.0000 mg | ORAL_TABLET | Freq: Three times a day (TID) | ORAL | 3 refills | Status: DC

## 2022-11-06 NOTE — Telephone Encounter (Signed)
Spoke with pt who reports she continues to experience "extra beats"  She denies current CP, SOB or dizziness.  She is taking Metoprolol Succinate '25mg'$  - 1 tablet tid as pt reports she could not take the Diltiazem '30mg'$  at lunch time.   Pt advised Dr Harrington Challenger has requested a 14 day ZIO monitor.  Order placed.  Pt advised she will be contacted by our monitor nurse with instructions.  Pt requests to come in to the office to have monitor put on.   Will forward to Dr Harrington Challenger to make her aware.  Pt verbalizes understanding and agrees with current plan.

## 2022-11-06 NOTE — Telephone Encounter (Signed)
Patient c/o Palpitations:  High priority if patient c/o lightheadedness, shortness of breath, or chest pain  How long have you had palpitations/irregular HR/ Afib? Are you having the symptoms now?  Palpitations, coming and going since 10/19. Normally occurs around lunch. She is currently having symptoms--she states has been sitting in [place] parking lot waiting for her heart to settle down. See 11/17 patient message  Are you currently experiencing lightheadedness, SOB or CP?  No   Do you have a history of afib (atrial fibrillation) or irregular heart rhythm?  Has period irregularities   Have you checked your BP or HR? (document readings if available):  No   Are you experiencing any other symptoms?  No

## 2022-11-07 ENCOUNTER — Other Ambulatory Visit: Payer: Self-pay | Admitting: Internal Medicine

## 2022-11-08 ENCOUNTER — Ambulatory Visit: Payer: Medicare Other | Attending: Internal Medicine

## 2022-11-08 ENCOUNTER — Other Ambulatory Visit: Payer: Self-pay | Admitting: Internal Medicine

## 2022-11-08 DIAGNOSIS — I471 Supraventricular tachycardia, unspecified: Secondary | ICD-10-CM

## 2022-11-08 DIAGNOSIS — I491 Atrial premature depolarization: Secondary | ICD-10-CM

## 2022-11-08 DIAGNOSIS — R002 Palpitations: Secondary | ICD-10-CM | POA: Diagnosis not present

## 2022-11-08 NOTE — Progress Notes (Unsigned)
[id number] ZIO XT from office inventory applied to patient.

## 2022-11-16 ENCOUNTER — Ambulatory Visit: Payer: Medicare Other | Admitting: Internal Medicine

## 2022-11-28 ENCOUNTER — Other Ambulatory Visit: Payer: Self-pay | Admitting: Internal Medicine

## 2022-11-29 ENCOUNTER — Encounter: Payer: Self-pay | Admitting: Internal Medicine

## 2022-11-29 MED ORDER — DILTIAZEM HCL ER COATED BEADS 120 MG PO CP24: 120.0000 mg | ORAL_CAPSULE | Freq: Every day | ORAL | 3 refills | Status: DC

## 2022-12-22 ENCOUNTER — Encounter: Payer: Self-pay | Admitting: Internal Medicine

## 2022-12-26 ENCOUNTER — Telehealth: Payer: Self-pay

## 2022-12-26 MED ORDER — FLECAINIDE ACETATE 50 MG PO TABS: 50.0000 mg | ORAL_TABLET | Freq: Two times a day (BID) | ORAL | 3 refills | Status: DC

## 2022-12-26 NOTE — Telephone Encounter (Signed)
See monitor report.. pt starting on Flecainide.

## 2022-12-26 NOTE — Telephone Encounter (Signed)
Pt advised her lab results... she verbalized understanding....she is willing to try the Flecainide... she will have

## 2022-12-26 NOTE — Telephone Encounter (Signed)
-----   Message from Fay Records, MD sent at 12/25/2022  8:58 PM EST ----- Reviewed with EP   I would recomm Flecanide 50 mg bid    Keep on other meds    EKG in 2 wks

## 2022-12-27 ENCOUNTER — Encounter: Payer: Self-pay | Admitting: Internal Medicine

## 2022-12-27 NOTE — Telephone Encounter (Signed)
I spoke with the pt and she will let me know if she decides to try the Flecainide. Will keep her EKG nurse visit for now since she says she may start it this evening.

## 2023-01-02 ENCOUNTER — Other Ambulatory Visit: Payer: Self-pay | Admitting: Internal Medicine

## 2023-01-10 ENCOUNTER — Ambulatory Visit: Payer: Medicare Other

## 2023-01-10 ENCOUNTER — Encounter: Payer: Self-pay | Admitting: Internal Medicine

## 2023-01-10 NOTE — Telephone Encounter (Signed)
Please tell pt I have reviewed with our electrical doctors and they feel it is safe REcomm EKG in 2 wks

## 2023-01-19 ENCOUNTER — Other Ambulatory Visit: Payer: Self-pay

## 2023-01-19 ENCOUNTER — Encounter: Payer: Self-pay | Admitting: Internal Medicine

## 2023-01-19 DIAGNOSIS — R002 Palpitations: Secondary | ICD-10-CM

## 2023-01-19 MED ORDER — METOPROLOL SUCCINATE ER 25 MG PO TB24: 25.0000 mg | ORAL_TABLET | Freq: Three times a day (TID) | ORAL | 3 refills | Status: DC

## 2023-01-22 MED ORDER — METOPROLOL SUCCINATE ER 25 MG PO TB24: 25.0000 mg | ORAL_TABLET | Freq: Three times a day (TID) | ORAL | 3 refills | Status: DC

## 2023-01-30 ENCOUNTER — Encounter: Payer: Self-pay | Admitting: Cardiovascular Disease

## 2023-01-30 ENCOUNTER — Ambulatory Visit: Payer: Medicare Other | Attending: Cardiovascular Disease | Admitting: Cardiovascular Disease

## 2023-01-30 VITALS — BP 138/70 | HR 78 | Ht 66.0 in | Wt 142.0 lb

## 2023-01-30 DIAGNOSIS — I10 Essential (primary) hypertension: Secondary | ICD-10-CM

## 2023-01-30 DIAGNOSIS — E782 Mixed hyperlipidemia: Secondary | ICD-10-CM

## 2023-01-30 DIAGNOSIS — I471 Supraventricular tachycardia, unspecified: Secondary | ICD-10-CM

## 2023-01-30 DIAGNOSIS — Z8679 Personal history of other diseases of the circulatory system: Secondary | ICD-10-CM

## 2023-01-30 NOTE — Progress Notes (Signed)
01/30/2023 Rosendo Gros   25-Apr-1956  [id number]  Primary Physician Quay Burow, Claudina Lick, MD Primary Cardiologist: Lorretta Harp MD Lupe Carney, [place]  HPI:  Tiffany Moran is a 67 y.o. thin-appearing married Caucasian female mother of 2 children who is followed by Dr. Dorris Carnes.  She is see me for second opinion.  She has a history of family history of heart disease, hyperlipidemia on medical therapy, valve prolapse, anxiety, and palpitations.  Her palpitations began after her first child back in the 61s.  She has had episodes of PSVT that have been responsive in the past with carotid massage and adenosine.  Most recently she has been treated with calcium channel blockers and beta-blockers at various doses.  Her most recent event monitor performed in December 23 revealed short runs of SVT with PACs and PVCs.  She does drink 1 cup of coffee a day.  She does admit to having anxiety treated with Xanax.   Current Meds  Medication Sig   ALPRAZolam (XANAX) 0.5 MG tablet TAKE 1 TABLET BY MOUTH EVERY 8 HOURS AS NEEDED FOR ANXIETY   Cholecalciferol (VITAMIN D) 2000 units CAPS Take 2,000 Units by mouth daily.   diltiazem (CARDIZEM CD) 120 MG 24 hr capsule Take 1 capsule (120 mg total) by mouth daily.   ezetimibe (ZETIA) 10 MG tablet Take 0.5 tablets (5 mg total) by mouth daily.   Loteprednol Etabonate (LOTEMAX OP) Apply to eye as needed.   metoprolol succinate (TOPROL-XL) 25 MG 24 hr tablet Take 1 tablet (25 mg total) by mouth 3 (three) times daily.   rosuvastatin (CRESTOR) 20 MG tablet Take 1 tablet by mouth once daily   [DISCONTINUED] azelastine (ASTELIN) 0.1 % nasal spray Place 1 spray into both nostrils 2 (two) times daily. Use in each nostril as directed (Patient taking differently: Place 1 spray into both nostrils as needed. Use in each nostril as directed)   [DISCONTINUED] diltiazem (CARDIZEM) 30 MG tablet Take 1 tablet (30 mg total) by mouth daily.   [DISCONTINUED] flecainide (TAMBOCOR) 50  MG tablet Take 1 tablet (50 mg total) by mouth 2 (two) times daily.   [DISCONTINUED] fluticasone (FLONASE) 50 MCG/ACT nasal spray Place 1 spray into both nostrils daily. PRN   [DISCONTINUED] hydrocortisone (ANUSOL-HC) 2.5 % rectal cream Place 1 application rectally 2 (two) times daily. (Patient taking differently: Place 1 application  rectally 2 (two) times daily. PRN)   [DISCONTINUED] levalbuterol (XOPENEX HFA) 45 MCG/ACT inhaler Inhale 1 puff into the lungs every 4 (four) hours as needed for wheezing or shortness of breath.   [DISCONTINUED] promethazine (PHENERGAN) 25 MG suppository Place 1 suppository (25 mg total) rectally every 6 (six) hours as needed for nausea or vomiting.     Allergies  Allergen Reactions   Lipitor [Atorvastatin] Other (See Comments)    Did not tolerate    Social History   Socioeconomic History   Marital status: Married    Spouse name: Not on file   Number of children: Not on file   Years of education: Not on file   Highest education level: Not on file  Occupational History   Not on file  Tobacco Use   Smoking status: Never   Smokeless tobacco: Never  Vaping Use   Vaping Use: Never used  Substance and Sexual Activity   Alcohol use: No   Drug use: No   Sexual activity: Yes    Birth control/protection: Other-see comments, Post-menopausal    Comment: BTL  Other Topics  Concern   Not on file  Social History Narrative   Exercises regularly   Social Determinants of Health   Financial Resource Strain: Not on file  Food Insecurity: Not on file  Transportation Needs: Not on file  Physical Activity: Not on file  Stress: Not on file  Social Connections: Not on file  Intimate Partner Violence: Not on file     Review of Systems: General: negative for chills, fever, night sweats or weight changes.  Cardiovascular: negative for chest pain, dyspnea on exertion, edema, orthopnea, palpitations, paroxysmal nocturnal dyspnea or shortness of  breath Dermatological: negative for rash Respiratory: negative for cough or wheezing Urologic: negative for hematuria Abdominal: negative for nausea, vomiting, diarrhea, bright red blood per rectum, melena, or hematemesis Neurologic: negative for visual changes, syncope, or dizziness All other systems reviewed and are otherwise negative except as noted above.    Blood pressure 138/70, pulse 78, height 5' 6"$  (1.676 m), weight 142 lb (64.4 kg), last menstrual period 06/17/2009.  General appearance: alert and no distress Neck: no adenopathy, no carotid bruit, no JVD, supple, symmetrical, trachea midline, and thyroid not enlarged, symmetric, no tenderness/mass/nodules Lungs: clear to auscultation bilaterally Heart: regular rate and rhythm, S1, S2 normal, no murmur, click, rub or gallop Extremities: extremities normal, atraumatic, no cyanosis or edema Pulses: 2+ and symmetric Skin: Skin color, texture, turgor normal. No rashes or lesions Neurologic: Grossly normal  EKG sinus rhythm at 78 with septal Q waves and nonspecific ST and T wave changes.  Personally reviewed this EKG.  ASSESSMENT AND PLAN:   Hyperlipidemia History of hyperlipidemia on rosuvastatin and Zetia with lipid profile performed 10/05/2022 revealing total cholesterol 155, LDL 61 and HDL 79.  PAROXYSMAL ATRIAL TACHYCARDIA History of palpitations dating back to when she had her first child in 28.  She has had several episodes of SVT responsive to carotid massage and adenosine administration.  She has been on calcium channel Pat blockers and beta-blockers and Kathe Wirick's doses managed by Dr. Harrington Challenger.  Her most recent event monitor performed in December of last year showed short runs of SVT with PACs and PVCs.  EP had recommended flecainide twice daily which she never began.  These palpitations are somewhat incapacitating.  Of asked her to stop her caffeine intake.  She does admit to having anxiety on as needed Xanax.  History of  mitral valve prolapse History of mitral valve prolapse seen on her most recent echo 02/17/2022 that showed mild myxomatous mitral valve prolapse and normal LV function.  Will recheck a 2D echocardiogram.     Lorretta Harp MD [facility], [facility] 01/30/2023 9:20 AM

## 2023-01-30 NOTE — Patient Instructions (Addendum)
Medication Instructions:  Your physician recommends that you continue on your current medications as directed. Please refer to the Current Medication list given to you today.  *If you need a refill on your cardiac medications before your next appointment, please call your pharmacy*    Testing/Procedures: Your physician has requested that you have an echocardiogram. Echocardiography is a painless test that uses sound waves to create images of your heart. It provides your doctor with information about the size and shape of your heart and how well your heart's chambers and valves are working. This procedure takes approximately one hour. There are no restrictions for this procedure. Please do NOT wear cologne, perfume, aftershave, or lotions (deodorant is allowed). Please arrive 15 minutes prior to your appointment time. This procedure will be done at [address]    Follow-Up: At [facility], you and your health needs are our priority.  As part of our continuing mission to provide you with exceptional heart care, we have created designated Provider Care Teams.  These Care Teams include your primary Cardiologist (physician) and Advanced Practice Providers (APPs -  Physician Assistants and Nurse Practitioners) who all work together to provide you with the care you need, when you need it.  We recommend signing up for the patient portal called "MyChart".  Sign up information is provided on this After Visit Summary.  MyChart is used to connect with patients for Virtual Visits (Telemedicine).  Patients are able to view lab/test results, encounter notes, upcoming appointments, etc.  Non-urgent messages can be sent to your provider as well.   To learn more about what you can do with MyChart, go to [email or web address].    Your next appointment:   6 week(s)  Provider:   Quay Burow, MD

## 2023-01-30 NOTE — Assessment & Plan Note (Signed)
History of palpitations dating back to when she had her first child in 11.  She has had several episodes of SVT responsive to carotid massage and adenosine administration.  She has been on calcium channel Pat blockers and beta-blockers and Tiffany Moran's doses managed by Dr. Harrington Challenger.  Her most recent event monitor performed in December of last year showed short runs of SVT with PACs and PVCs.  EP had recommended flecainide twice daily which she never began.  These palpitations are somewhat incapacitating.  Of asked her to stop her caffeine intake.  She does admit to having anxiety on as needed Xanax.

## 2023-01-30 NOTE — Assessment & Plan Note (Signed)
History of hyperlipidemia on rosuvastatin and Zetia with lipid profile performed 10/05/2022 revealing total cholesterol 155, LDL 61 and HDL 79.

## 2023-01-30 NOTE — Assessment & Plan Note (Signed)
History of mitral valve prolapse seen on her most recent echo 02/17/2022 that showed mild myxomatous mitral valve prolapse and normal LV function.  Will recheck a 2D echocardiogram.

## 2023-02-02 NOTE — Addendum Note (Signed)
Addended by: Venetia Maxon on: 02/02/2023 02:38 PM   Modules accepted: Orders

## 2023-02-13 ENCOUNTER — Ambulatory Visit: Payer: Medicare Other

## 2023-02-19 ENCOUNTER — Ambulatory Visit (INDEPENDENT_AMBULATORY_CARE_PROVIDER_SITE_OTHER): Payer: Medicare Other

## 2023-02-19 DIAGNOSIS — Z8679 Personal history of other diseases of the circulatory system: Secondary | ICD-10-CM

## 2023-02-19 DIAGNOSIS — I10 Essential (primary) hypertension: Secondary | ICD-10-CM

## 2023-02-19 DIAGNOSIS — E782 Mixed hyperlipidemia: Secondary | ICD-10-CM | POA: Diagnosis not present

## 2023-02-19 DIAGNOSIS — I471 Supraventricular tachycardia, unspecified: Secondary | ICD-10-CM | POA: Diagnosis not present

## 2023-02-19 LAB — ECHOCARDIOGRAM COMPLETE
Area-P 1/2: 3.99 cm2
MV M vel: 2.7 m/s
MV Peak grad: 29.2 mmHg
S' Lateral: 2.3 cm

## 2023-02-23 ENCOUNTER — Encounter: Payer: Self-pay | Admitting: Internal Medicine

## 2023-02-23 DIAGNOSIS — I7300 Raynaud's syndrome without gangrene: Secondary | ICD-10-CM

## 2023-03-06 ENCOUNTER — Encounter: Payer: Self-pay | Admitting: Cardiovascular Disease

## 2023-03-06 ENCOUNTER — Telehealth: Payer: Self-pay

## 2023-03-06 ENCOUNTER — Ambulatory Visit: Payer: Medicare Other | Attending: Cardiovascular Disease | Admitting: Cardiovascular Disease

## 2023-03-06 VITALS — BP 120/82 | HR 67 | Ht 66.0 in | Wt 142.0 lb

## 2023-03-06 DIAGNOSIS — R002 Palpitations: Secondary | ICD-10-CM | POA: Diagnosis present

## 2023-03-06 NOTE — Progress Notes (Signed)
Since returns today for follow-up of palpitations.  She had a 2D echo performed 02/19/2023 which was entirely normal.  Since dramatically cutting back on her caffeine her palpitations have improved by 70 to 90% and she is comfortable with this.  She did decrease her metoprolol from 3 times daily to twice daily.  I told her she can take an extra metoprolol should her palpitations become more frequent and are severe.  We talked about the importance of sleep and hydration.  I will see her back in 1 year for follow-up.  Lorretta Harp, M.D., [place], [facility], [place] [address]. [place], [place]  [address]  [phone] 03/06/2023 9:48 AM

## 2023-03-06 NOTE — Patient Instructions (Signed)
Medication Instructions:  Your physician recommends that you continue on your current medications as directed. Please refer to the Current Medication list given to you today.  *If you need a refill on your cardiac medications before your next appointment, please call your pharmacy*   Follow-Up: At [facility], you and your health needs are our priority.  As part of our continuing mission to provide you with exceptional heart care, we have created designated Provider Care Teams.  These Care Teams include your primary Cardiologist (physician) and Advanced Practice Providers (APPs -  Physician Assistants and Nurse Practitioners) who all work together to provide you with the care you need, when you need it.  We recommend signing up for the patient portal called "MyChart".  Sign up information is provided on this After Visit Summary.  MyChart is used to connect with patients for Virtual Visits (Telemedicine).  Patients are able to view lab/test results, encounter notes, upcoming appointments, etc.  Non-urgent messages can be sent to your provider as well.   To learn more about what you can do with MyChart, go to [email or web address].    Your next appointment:   12 month(s)  Provider:   Jonathan Berry, MD    

## 2023-03-06 NOTE — Telephone Encounter (Signed)
Pt would like a provider switch from Dr. Harrington Challenger to Dr. Gwenlyn Found.

## 2023-03-14 NOTE — Progress Notes (Signed)
Office Visit Note  Patient: Tiffany Moran             Date of Birth: [birth year]           MRN: [id number]             PCP: Binnie Rail, MD Referring: Binnie Rail, MD Visit Date: 03/19/2023 Occupation: @GUAROCC @  Subjective:  Pain in both hands and raynaud's  History of Present Illness: Tiffany Moran is a 67 y.o. female with history of inflammatory arthritis, osteoarthritis and Raynaud's phenomenon.  She returns today after her last visit in September 2020.  Initially seen by me in August 2000 2020 when she was referred by Dr. Sabra Heck.  She started having rash on her hands in January 2000 2020 which responded to Neosporin.  In March 2000 2020 she noticed left thumb swelling and swelling in the knuckles of her right hand.  She had labs done by her cardiologist and the serology was negative.  She also gives history of Raynauds for many years.  She returns today after her last visit in September 2000 2020.  She states in February 2024 she developed some sores on her fingers which she describes on her right second and fifth DIP joints.  She states she used flecainide cream without much help.  The pain gradually moved to her PIP joints in her right hand.  She tried Advil and ice without much relief.  She states she had some leftover prednisone.  She took prednisone 20 mg p.o. daily for 5 days which helped.  She still continues to have some discomfort in her right index finger nailbed.  She has occasional aching of her left elbow joint.  The other joints are painful.  She wears gloves for raynaud's phenominon.  No family history of lupus or similar illness.  She is gravida 2, para 2.    Activities of Daily Living:  Patient reports morning stiffness for 0 minute.   Patient Denies nocturnal pain.  Difficulty dressing/grooming: Denies Difficulty climbing stairs: Denies Difficulty getting out of chair: Denies Difficulty using hands for taps, buttons, cutlery, and/or writing: Denies  Review of  Systems  Constitutional:  Positive for fatigue.  HENT:  Positive for mouth dryness. Negative for mouth sores.   Eyes:  Positive for dryness.  Respiratory:  Negative for shortness of breath.   Cardiovascular:  Positive for palpitations. Negative for chest pain.  Gastrointestinal:  Negative for blood in stool, constipation and diarrhea.  Endocrine: Negative for increased urination.  Genitourinary:  Negative for involuntary urination.  Musculoskeletal:  Positive for joint pain, joint pain and joint swelling. Negative for gait problem, myalgias, muscle weakness, morning stiffness, muscle tenderness and myalgias.  Skin:  Positive for rash. Negative for color change, hair loss and sensitivity to sunlight.  Allergic/Immunologic: Negative for susceptible to infections.  Neurological:  Negative for dizziness and headaches.  Hematological:  Negative for swollen glands.  Psychiatric/Behavioral:  Positive for sleep disturbance. Negative for depressed mood. The patient is nervous/anxious.     PMFS History:  Patient Active Problem List   Diagnosis Date Noted   Sinus congestion 08/09/2022   Nasal and sinus discharge 08/09/2022   Hyperglycemia 03/09/2021   Osteoarthritis of right knee 03/08/2020   Hypertension 01/22/2019   Chronic sinusitis 09/11/2018   Arthritis of hand, degenerative 04/17/2018   Anterior neck pain 04/10/2018   Multiple thyroid nodules-stable, no follow-up needed 12/03/2017   Anxiety 09/26/2017   Liver hemangioma 12/05/2016   Renal cyst, right 12/05/2016  Retinal hole of right eye 02/09/2016   Acute non-recurrent maxillary sinusitis 11/25/2015   Pes anserine bursitis 04/21/2014   SI (sacroiliac) joint dysfunction 04/21/2014   Osteopenia 08/04/2011   Vitamin D deficiency 08/04/2011   History of mitral valve prolapse 07/26/2010   PAROXYSMAL ATRIAL TACHYCARDIA 01/05/2010   Raynaud's syndrome 01/05/2010   Hyperlipidemia 08/17/2009   POLYARTHRITIS 08/17/2009    Past Medical  History:  Diagnosis Date   Cardiospasm 1983   felt may be related to MVP   Hyperlipidemia    Hypertension    Kidney cysts 11/2015   cyst on one of her kidneys, no treatment required   Mitral valve prolapse    PONV (postoperative nausea and vomiting)    teeth chipped   Raynaud disease    Retinal hole of right eye    Shingles 2012   leg   Tachycardia     Family History  Problem Relation Age of Onset   Heart attack Father 48   Myasthenia gravis Father    Hypertension Father    Hyperlipidemia Father    Osteoporosis Father        due to steroids for Myasthenia Grvis   Leukemia Mother 43   Hyperlipidemia Mother    Hypertension Mother    Hypothyroidism Mother    Prostate cancer Brother        in 66s   Hyperlipidemia Brother    Stroke Maternal Grandfather        in 36s   Healthy Son    Healthy Daughter    Diabetes Neg Hx    Past Surgical History:  Procedure Laterality Date   COLONOSCOPY  2007   [place], [place] ,[place] N/A 08/29/2016   Procedure: DILATATION & CURETTAGE/HYSTEROSCOPY;  Surgeon: Salvadore Dom, MD;  Location: [facility];  Service: Gynecology;  Laterality: N/A;   DILATION AND CURETTAGE OF UTERUS     EXCISION OF SKIN TAG Left 08/29/2016   Procedure: EXCISION OF SKIN TAG left labia majora;  Surgeon: Salvadore Dom, MD;  Location: [facility];  Service: Gynecology;  Laterality: Left;   EXPLORATORY LAPAROTOMY     X2 for pain: fibroid, enlarged oviduct   RETINAL LASER PROCEDURE Right 08/2015   SALPINGECTOMY  2007 or 2008   and fibroid removed   TUBAL LIGATION     Social History   Social History Narrative   Exercises regularly   Immunization History  Administered Date(s) Administered   PFIZER(Purple Top)SARS-COV-2 Vaccination 01/23/2020, 02/13/2020   Tdap 04/03/2011, 05/15/2020     Objective: Vital Signs: BP 121/70 (BP Location: Right Arm, Patient Position: Sitting, Cuff Size: Normal)   Pulse 62   Resp  12   Ht 5\' 6"  (1.676 m)   Wt 144 lb (65.3 kg)   LMP 06/17/2009 (Approximate)   BMI 23.24 kg/m    Physical Exam Vitals and nursing note reviewed.  Constitutional:      Appearance: She is well-developed.  HENT:     Head: Normocephalic and atraumatic.  Eyes:     Conjunctiva/sclera: Conjunctivae normal.  Cardiovascular:     Rate and Rhythm: Normal rate and regular rhythm.     Heart sounds: Normal heart sounds.  Pulmonary:     Effort: Pulmonary effort is normal.     Breath sounds: Normal breath sounds.  Abdominal:     General: Bowel sounds are normal.     Palpations: Abdomen is soft.  Musculoskeletal:     Cervical back: Normal range of  motion.  Lymphadenopathy:     Cervical: No cervical adenopathy.  Skin:    General: Skin is warm and dry.     Capillary Refill: Capillary refill takes more than 3 seconds.     Comments: Decreased capillary refill was noted.  No digital ulcers were noted.  No nailbed capillary changes were noted.  No telangiectasias were noted.  No sclerodactyly was noted.  Neurological:     Mental Status: She is alert and oriented to person, place, and time.  Psychiatric:        Behavior: Behavior normal.      Musculoskeletal Exam: Cervical, thoracic and lumbar spine were in good range of motion.  Shoulder joints, elbow joints, wrist joints were in good range of motion.  She had tenderness over right third MCP but no synovitis was noted.  Bilateral PIP and DIP thickening with no synovitis over  the PIP joints was noted.  Hip joints and knee joints were in good range of motion without any warmth swelling or effusion.  There was no tenderness over ankles or MTPs.  CDAI Exam: CDAI Score: -- Patient Global: --; Provider Global: -- Swollen: --; Tender: -- Joint Exam 03/19/2023   No joint exam has been documented for this visit   There is currently no information documented on the homunculus. Go to the Rheumatology activity and complete the homunculus joint  exam.  Investigation: No additional findings.  Imaging: ECHOCARDIOGRAM COMPLETE  Result Date: 02/19/2023    ECHOCARDIOGRAM REPORT   Patient Name:   GABRIALLE DIVITO Date of Exam: 02/19/2023 Medical Rec #:  [id number]   Height:       66.0 in Accession #:    [id number]  Weight:       142.0 lb Date of Birth:  1956-07-03   BSA:          1.729 m Patient Age:    39 years    BP:           120/75 mmHg Patient Gender: F           HR:           65 bpm. Exam Location:  Outpatient Procedure: 2D Echo, 3D Echo, Cardiac Doppler, Color Doppler and Strain Analysis Indications:    Hyperlipidemia [id number]  History:        Patient has prior history of Echocardiogram examinations, most                 recent 02/18/2019. Arrythmias:PVC and PAC; Signs/Symptoms:Murmur.                 Palpitations; episodes of PSVT that have been responsive in the                 past with carotid massage and adenosine; History of mitral valve                 prolapse.  Sonographer:    Leavy Cella RDCS Referring Phys: Adams  1. Left ventricular ejection fraction, by estimation, is 60 to 65%. The left ventricle has normal function. The left ventricle has no regional wall motion abnormalities. Left ventricular diastolic parameters were normal.  2. Right ventricular systolic function is normal. The right ventricular size is normal. There is normal pulmonary artery systolic pressure. The estimated right ventricular systolic pressure is 123456 mmHg.  3. The mitral valve is normal in structure. Trivial mitral valve regurgitation.  4. The aortic valve is tricuspid. There is mild calcification  of the aortic valve. There is mild thickening of the aortic valve. Aortic valve regurgitation is not visualized. Aortic valve sclerosis/calcification is present, without any evidence of aortic stenosis.  5. The inferior vena cava is normal in size with greater than 50% respiratory variability, suggesting right atrial pressure of 3 mmHg.  Comparison(s): No prior Echocardiogram. FINDINGS  Left Ventricle: Left ventricular ejection fraction, by estimation, is 60 to 65%. The left ventricle has normal function. The left ventricle has no regional wall motion abnormalities. Global longitudinal strain performed but not reported based on interpreter judgement due to suboptimal tracking. 3D left ventricular ejection fraction analysis performed but not reported based on interpreter judgement due to suboptimal tracking. The left ventricular internal cavity size was normal in size. There is no left ventricular hypertrophy. Left ventricular diastolic parameters were normal. Right Ventricle: The right ventricular size is normal. No increase in right ventricular wall thickness. Right ventricular systolic function is normal. There is normal pulmonary artery systolic pressure. The tricuspid regurgitant velocity is 2.27 m/s, and  with an assumed right atrial pressure of 3 mmHg, the estimated right ventricular systolic pressure is 123456 mmHg. Left Atrium: Left atrial size was normal in size. Right Atrium: Right atrial size was normal in size. Pericardium: There is no evidence of pericardial effusion. Mitral Valve: The mitral valve is normal in structure. Trivial mitral valve regurgitation. Tricuspid Valve: The tricuspid valve is normal in structure. Tricuspid valve regurgitation is trivial. Aortic Valve: The aortic valve is tricuspid. There is mild calcification of the aortic valve. There is mild thickening of the aortic valve. Aortic valve regurgitation is not visualized. Aortic valve sclerosis/calcification is present, without any evidence of aortic stenosis. Pulmonic Valve: The pulmonic valve was normal in structure. Pulmonic valve regurgitation is trivial. Aorta: The aortic root and ascending aorta are structurally normal, with no evidence of dilitation. Venous: The inferior vena cava is normal in size with greater than 50% respiratory variability, suggesting right  atrial pressure of 3 mmHg. IAS/Shunts: The atrial septum is grossly normal.  LEFT VENTRICLE PLAX 2D LVIDd:         3.63 cm   Diastology LVIDs:         2.30 cm   LV e' medial:    6.53 cm/s LV PW:         0.92 cm   LV E/e' medial:  9.0 LV IVS:        0.85 cm   LV e' lateral:   9.46 cm/s LVOT diam:     1.80 cm   LV E/e' lateral: 6.2 LV SV:         56 LV SV Index:   32 LVOT Area:     2.54 cm                           3D Volume EF:                          3D EF:        42 %                          LV EDV:       85 ml                          LV ESV:       50  ml                          LV SV:        35 ml RIGHT VENTRICLE RV Basal diam:  3.84 cm RV Mid diam:    2.37 cm RV S prime:     14.60 cm/s TAPSE (M-mode): 3.2 cm LEFT ATRIUM             Index        RIGHT ATRIUM           Index LA diam:        3.40 cm 1.97 cm/m   RA Area:     10.80 cm LA Vol (A2C):   37.4 ml 21.63 ml/m  RA Volume:   23.50 ml  13.59 ml/m LA Vol (A4C):   29.3 ml 16.95 ml/m LA Biplane Vol: 33.3 ml 19.26 ml/m  AORTIC VALVE LVOT Vmax:   95.90 cm/s LVOT Vmean:  64.500 cm/s LVOT VTI:    0.219 m  AORTA Ao Root diam: 2.80 cm Ao Asc diam:  3.40 cm MITRAL VALVE               TRICUSPID VALVE MV Area (PHT): 3.99 cm    TR Peak grad:   20.6 mmHg MV Decel Time: 190 msec    TR Vmax:        227.00 cm/s MR Peak grad: 29.2 mmHg MR Vmax:      270.00 cm/s  SHUNTS MV E velocity: 58.60 cm/s  Systemic VTI:  0.22 m MV A velocity: 48.40 cm/s  Systemic Diam: 1.80 cm MV E/A ratio:  1.21 Gwyndolyn Kaufman MD Electronically signed by Gwyndolyn Kaufman MD Signature Date/Time: 02/19/2023/1:20:41 PM    Final     Recent Labs: Lab Results  Component Value Date   WBC 4.8 10/27/2022   HGB 14.2 10/27/2022   PLT 154.0 10/27/2022   NA 140 10/27/2022   K 4.0 10/27/2022   CL 104 10/27/2022   CO2 31 10/27/2022   GLUCOSE 91 10/27/2022   BUN 14 10/27/2022   CREATININE 0.86 10/27/2022   BILITOT 0.8 10/27/2022   ALKPHOS 80 10/27/2022   AST 26 10/27/2022   ALT 40 (H)  10/27/2022   PROT 7.1 10/27/2022   ALBUMIN 4.6 10/27/2022   CALCIUM 9.5 10/27/2022   GFRAA 84 11/05/2020   October 28, 2022 TSH normal.  Magnesium normal.  Hemoglobin A1c 5.6.    Speciality Comments: No specialty comments available.  Procedures:  No procedures performed Allergies: Lipitor [atorvastatin]   Assessment / Plan:     Visit Diagnoses: Raynaud's disease without gangrene -patient continues to have severe Raynauds involving her hands.  She states that she also had Raynaud's in her feet but she is mostly wearing shoes and socks which controls her symptoms.  She denies any history of digital ulcers.  She had decreased capillary refill with no nailbed capillary changes.  No digital ulcers were noted.  No telangiectasias were noted.  She is also on metoprolol which contributes to worsening of her raynaud's phenomenon.  She is on Cardizem CD which should help Raynauds to some extent.  I discussed possible options of adding Zoloft or sildenafil.  She will discuss that further with her cardiologist.  Relaxation techniques like meditation was also discussed.  Information regarding Raynaud's phenomenon was provided.  A handout was also placed in the AVS. keeping core temperature warm and warm clothing was discussed.  Plan: Sedimentation rate,  ANA, Anti-scleroderma antibody, RNP Antibody, Anti-DNA antibody, double-stranded, C3 and C4.  Labs obtained in July 2020 were reviewed.  At the time all autoimmune workup was negative. 06/2019 SPEP normal, immunoglobulins normal, ANCA negative, MPO negative, serum proteinase 3 negative, beta-2 GP 1 negative, anticardiolipin negative, ENA (double-stranded DNA, Smith, RNP, SCL 70, SSA, SSB) negative, C3-C4 normal, G6PD normal, hepatitis B-, hepatitis C negative, CK 45, uric acid 2.8, HLA-B27 negative, ACE 46, anti-CCP negative, 14 3 3  ETA negative  Primary osteoarthritis of both hands -she has severe osteoarthritis in her hands with bilateral PIP and DIP  thickening.  I will obtain x-rays today.  Patient reports having lesion on her right fifth finger DIP joint which could have been a mucinous cyst.  I do not see the cyst now as it is resolved.  Joint protection muscle strengthening was discussed.  She gives history of inflammatory arthritis in her hands.  I also discussed possible option of hydroxychloroquine if she continues to have inflammation.  She would like to think about it.  Plan: XR Hand 2 View Right, XR Hand 2 View Left.  X-rays obtained today showed CMC, PIP and DIP narrowing.  No MCP, intercarpal radiocarpal joint space narrowing was noted.  No erosive changes were noted.  No radiographic progression was noted when compared to the x-rays of 2020.  Detailed counsel regarding osteoarthritis was provided.  A handout was placed in the AVS.  Pain in both hands - Plan: Rheumatoid factor, Cyclic citrul peptide antibody, IgG  SI (sacroiliac) joint dysfunction-she has not remittent discomfort.  Primary osteoarthritis of right knee-she has not returned discomfort with some activities.  She is currently not having any symptoms.  DDD (degenerative disc disease), lumbar-she is followed at [facility].  She denies any discomfort today.  She had good mobility in her lumbar spine.  Osteopenia of neck of left femur - TScore -2.20 June 2021.  Use of calcium rich diet and vitamin D was advised.  Vitamin D deficiency-vitamin D has been normal.  Primary hypertension-blood pressure with normal at 121/70.  Mixed hyperlipidemia  PAROXYSMAL ATRIAL TACHYCARDIA  History of mitral valve prolapse  Hyperglycemia  Liver hemangioma  Anxiety-she may benefit from addition of Zoloft.  Multiple thyroid nodules-stable, no follow-up needed  Other chronic sinusitis    Orders: Orders Placed This Encounter  Procedures   XR Hand 2 View Right   XR Hand 2 View Left   Sedimentation rate   Rheumatoid factor   Cyclic citrul peptide antibody, IgG   ANA    Anti-scleroderma antibody   RNP Antibody   Anti-DNA antibody, double-stranded   C3 and C4   No orders of the defined types were placed in this encounter.    Follow-Up Instructions: Return for Raynauds, osteoarthritis.   Bo Merino, MD  Note - This record has been created using Editor, commissioning.  Chart creation errors have been sought, but may not always  have been located. Such creation errors do not reflect on  the standard of medical care.

## 2023-03-19 ENCOUNTER — Ambulatory Visit: Payer: Medicare Other

## 2023-03-19 ENCOUNTER — Ambulatory Visit: Payer: Medicare Other | Attending: Rheumatology | Admitting: Rheumatology

## 2023-03-19 ENCOUNTER — Encounter: Payer: Self-pay | Admitting: Rheumatology

## 2023-03-19 ENCOUNTER — Ambulatory Visit (INDEPENDENT_AMBULATORY_CARE_PROVIDER_SITE_OTHER): Payer: Medicare Other

## 2023-03-19 VITALS — BP 121/70 | HR 62 | Resp 12 | Ht 66.0 in | Wt 144.0 lb

## 2023-03-19 DIAGNOSIS — M7050 Other bursitis of knee, unspecified knee: Secondary | ICD-10-CM

## 2023-03-19 DIAGNOSIS — M19041 Primary osteoarthritis, right hand: Secondary | ICD-10-CM

## 2023-03-19 DIAGNOSIS — I471 Supraventricular tachycardia, unspecified: Secondary | ICD-10-CM | POA: Insufficient documentation

## 2023-03-19 DIAGNOSIS — I10 Essential (primary) hypertension: Secondary | ICD-10-CM | POA: Insufficient documentation

## 2023-03-19 DIAGNOSIS — M533 Sacrococcygeal disorders, not elsewhere classified: Secondary | ICD-10-CM | POA: Insufficient documentation

## 2023-03-19 DIAGNOSIS — M19042 Primary osteoarthritis, left hand: Secondary | ICD-10-CM | POA: Insufficient documentation

## 2023-03-19 DIAGNOSIS — I7300 Raynaud's syndrome without gangrene: Secondary | ICD-10-CM | POA: Insufficient documentation

## 2023-03-19 DIAGNOSIS — M85852 Other specified disorders of bone density and structure, left thigh: Secondary | ICD-10-CM | POA: Diagnosis present

## 2023-03-19 DIAGNOSIS — R739 Hyperglycemia, unspecified: Secondary | ICD-10-CM | POA: Insufficient documentation

## 2023-03-19 DIAGNOSIS — M5136 Other intervertebral disc degeneration, lumbar region: Secondary | ICD-10-CM | POA: Insufficient documentation

## 2023-03-19 DIAGNOSIS — J328 Other chronic sinusitis: Secondary | ICD-10-CM | POA: Insufficient documentation

## 2023-03-19 DIAGNOSIS — M79641 Pain in right hand: Secondary | ICD-10-CM | POA: Diagnosis present

## 2023-03-19 DIAGNOSIS — D1803 Hemangioma of intra-abdominal structures: Secondary | ICD-10-CM | POA: Insufficient documentation

## 2023-03-19 DIAGNOSIS — E559 Vitamin D deficiency, unspecified: Secondary | ICD-10-CM | POA: Insufficient documentation

## 2023-03-19 DIAGNOSIS — Z8679 Personal history of other diseases of the circulatory system: Secondary | ICD-10-CM | POA: Insufficient documentation

## 2023-03-19 DIAGNOSIS — E782 Mixed hyperlipidemia: Secondary | ICD-10-CM | POA: Insufficient documentation

## 2023-03-19 DIAGNOSIS — E042 Nontoxic multinodular goiter: Secondary | ICD-10-CM | POA: Insufficient documentation

## 2023-03-19 DIAGNOSIS — F419 Anxiety disorder, unspecified: Secondary | ICD-10-CM | POA: Insufficient documentation

## 2023-03-19 DIAGNOSIS — M1711 Unilateral primary osteoarthritis, right knee: Secondary | ICD-10-CM | POA: Diagnosis not present

## 2023-03-19 DIAGNOSIS — M79642 Pain in left hand: Secondary | ICD-10-CM | POA: Insufficient documentation

## 2023-03-19 NOTE — Patient Instructions (Signed)
Raynaud's Phenomenon  Raynaud's phenomenon is a condition that affects the blood vessels (arteries) that carry blood to the fingers and toes. The arteries that supply blood to the ears, lips, nipples, or the tip of the nose might also be affected. Raynaud's phenomenon causes the arteries to become narrow temporarily (spasm). As a result, the flow of blood to the affected areas is temporarily decreased. This usually occurs in response to cold temperatures or stress. During an attack, the skin in the affected areas turns white, then blue, and finally red. A person may also feel tingling or numbness in those areas. Attacks usually last for only a brief period, and then the blood flow to the area returns to normal. In most cases, Raynaud's phenomenon does not cause serious health problems. What are the causes? In many cases, the cause of this condition is not known. The condition may occur on its own (primary Raynaud's phenomenon) or may be associated with other diseases or factors (secondary Raynaud's phenomenon). Possible causes may include: Diseases or medical conditions that damage the arteries. Injuries and repetitive actions that hurt the hands or feet. Being exposed to certain chemicals. Taking medicines that narrow the arteries. Other medical conditions, such as lupus, scleroderma, rheumatoid arthritis, thyroid problems, blood disorders, Sjogren syndrome, or atherosclerosis. What increases the risk? The following factors may make you more likely to develop this condition: Being 20-40 years old. Being female. Having a family history of Raynaud's phenomenon. Living in a cold climate. Smoking. What are the signs or symptoms? Symptoms of this condition usually occur when you are exposed to cold temperatures or when you have emotional stress. The symptoms may last for a few minutes or up to several hours. They usually affect your fingers but may also affect your toes, nipples, lips, ears, or the  tip of your nose. Symptoms may include: Changes in skin color. The skin in the affected areas will turn pale or white. The skin may then change from white to bluish to red as normal blood flow returns to the area. Numbness, tingling, or pain in the affected areas. In severe cases, symptoms may include: Skin sores. Tissues decaying and dying (gangrene). How is this diagnosed? This condition may be diagnosed based on: Your symptoms and medical history. A physical exam. During the exam, you may be asked to put your hands in cold water to check for a reaction to cold temperature. Tests, such as: Blood tests to check for other diseases or conditions. A test to check the movement of blood through your arteries and veins (vascular ultrasound). A test in which the skin at the base of your fingernail is examined under a microscope (nailfold capillaroscopy). How is this treated? During an episode, you can take actions to help symptoms go away faster. Options include moving your arms around in a windmill pattern, warming your fingers under warm water, or placing your fingers in a warm body fold, such as your armpit. Long-term treatment for this condition often involves making lifestyle changes and taking steps to control your exposure to cold temperature. For more severe cases, medicine (calcium channel blockers) may be used to improve blood circulation. Follow these instructions at home: Avoiding cold temperatures Take these steps to avoid exposure to cold: If possible, stay indoors during cold weather. When you go outside during cold weather, dress in layers and wear mittens, a hat, a scarf, and warm footwear. Wear mittens or gloves when handling ice or frozen food. Use holders for glasses or cans containing   cold drinks. Let warm water run for a while before taking a shower or bath. Warm up the car before driving in cold weather. Lifestyle If possible, avoid stressful and emotional situations. Try  to find ways to manage your stress, such as: Exercise. Yoga. Meditation. Biofeedback. Do not use any products that contain nicotine or tobacco. These products include cigarettes, chewing tobacco, and vaping devices, such as e-cigarettes. If you need help quitting, ask your health care provider. Avoid secondhand smoke. Limit your use of caffeine. Switch to decaffeinated coffee, tea, and soda. Avoid chocolate. Avoid vibrating tools and machinery. General instructions Protect your hands and feet from injuries, cuts, or bruises. Avoid wearing tight rings or wristbands. Wear loose fitting socks and comfortable, roomy shoes. Take over-the-counter and prescription medicines only as told by your health care provider. Where to find support Raynaud's Association: www.raynauds.org Where to find more information [employer] of Arthritis and Musculoskeletal and Skin Diseases: www.niams.SouthExposed.es Contact a health care provider if: Your discomfort becomes worse despite lifestyle changes. You develop sores on your fingers or toes that do not heal. You have breaks in the skin on your fingers or toes. You have a fever. You have pain or swelling in your joints. You have a rash. Your symptoms occur on only one side of your body. Get help right away if: Your fingers or toes turn black. You have severe pain in the affected areas. These symptoms may represent a serious problem that is an emergency. Do not wait to see if the symptoms will go away. Get medical help right away. Call your local emergency services (911 in the [place] Do not drive yourself to the hospital. Summary Raynaud's phenomenon is a condition that affects the arteries that carry blood to the fingers, toes, ears, lips, nipples, or the tip of the nose. In many cases, the cause of this condition is not known. Symptoms of this condition include changes in skin color along with numbness and tingling in the affected area. Treatment for  this condition includes lifestyle changes and reducing exposure to cold temperatures. Medicines may be used for severe cases of the condition. Contact your health care provider if your condition worsens despite treatment. This information is not intended to replace advice given to you by your health care provider. Make sure you discuss any questions you have with your health care provider. Document Revised: 02/08/2021 Document Reviewed: 02/08/2021 Elsevier Patient Education  [place].  Osteoarthritis  Osteoarthritis is a type of arthritis. It refers to joint pain or joint disease. Osteoarthritis affects tissue that covers the ends of bones in joints (cartilage). Cartilage acts as a cushion between the bones and helps them move smoothly. Osteoarthritis occurs when cartilage in the joints gets worn down. Osteoarthritis is sometimes called "wear and tear" arthritis. Osteoarthritis is the most common form of arthritis. It often occurs in older people. It is a condition that gets worse over time. The joints most often affected by this condition are in the fingers, toes, hips, knees, and spine, including the neck and lower back. What are the causes? This condition is caused by the wearing down of cartilage that covers the ends of bones. What increases the risk? The following factors may make you more likely to develop this condition: Being age 13 or older. Obesity. Overuse of joints. Past injury of a joint. Past surgery on a joint. Family history of osteoarthritis. What are the signs or symptoms? The main symptoms of this condition are pain, swelling, and stiffness  in the joint. Other symptoms may include: An enlarged joint. More pain and further damage caused by small pieces of bone or cartilage that break off and float inside of the joint. Small deposits of bone (osteophytes) that grow on the edges of the joint. A grating or scraping feeling inside the joint when you move it. Popping  or creaking sounds when you move. Difficulty walking or exercising. An inability to grip items, twist your hand, or control the movements of your hands and fingers. How is this diagnosed? This condition may be diagnosed based on: Your medical history. A physical exam. Your symptoms. X-rays of the affected joints. Blood tests to rule out other types of arthritis. How is this treated? There is no cure for this condition, but treatment can help control pain and improve joint function. Treatment may include a combination of therapies, such as: Pain relief techniques, such as: Applying heat and cold to the joint. Massage. A form of talk therapy called cognitive behavioral therapy (CBT). This therapy helps you set goals and follow up on the changes that you make. Medicines for pain and inflammation. The medicines can be taken by mouth or applied to the skin. They include: NSAIDs, such as ibuprofen. Prescription medicines. Strong anti-inflammatory medicines (corticosteroids). Certain nutritional supplements. A prescribed exercise program. You may work with a physical therapist. Assistive devices, such as a brace, wrap, splint, specialized glove, or cane. A weight control plan. Surgery, such as: An osteotomy. This is done to reposition the bones and relieve pain or to remove loose pieces of bone and cartilage. Joint replacement surgery. You may need this surgery if you have advanced osteoarthritis. Follow these instructions at home: Activity Rest your affected joints as told by your health care provider. Exercise as told by your provider. The provider may recommend specific types of exercise, such as: Strengthening exercises. These are done to strengthen the muscles that support joints affected by arthritis. Aerobic activities. These are exercises, such as brisk walking or water aerobics, that increase your heart rate. Range-of-motion activities. These help your joints move more  easily. Balance and agility exercises. Managing pain, stiffness, and swelling     If told, apply heat to the affected area as often as told by your provider. Use the heat source that your provider recommends, such as a moist heat pack or a heating pad. If you have a removable assistive device, remove it as told by your provider. Place a towel between your skin and the heat source. If your provider tells you to keep the assistive device on while you apply heat, place a towel between the assistive device and the heat source. Leave the heat on for 20-30 minutes. If told, put ice on the affected area. If you have a removable assistive device, remove it as told by your provider. Put ice in a plastic bag. Place a towel between your skin and the bag. If your provider tells you to keep the assistive device on during icing, place a towel between the assistive device and the bag. Leave the ice on for 20 minutes, 2-3 times a day. If your skin turns bright red, remove the ice or heat right away to prevent skin damage. The risk of damage is higher if you cannot feel pain, heat, or cold. Move your fingers or toes often to reduce stiffness and swelling. Raise (elevate) the affected area above the level of your heart while you are sitting or lying down. General instructions Take over-the-counter and prescription medicines only  as told by your provider. Maintain a healthy weight. Follow instructions from your provider for weight control. Do not use any products that contain nicotine or tobacco. These products include cigarettes, chewing tobacco, and vaping devices, such as e-cigarettes. If you need help quitting, ask your provider. Use assistive devices as told by your provider. Where to find more information [employer] of Arthritis and Musculoskeletal and Skin Diseases: niams.SouthExposed.es [employer] on Aging: [employer].co.nz American College of Rheumatology: rheumatology.org Contact a health care  provider if: You have redness, swelling, or a feeling of warmth in a joint that gets worse. You have a fever along with joint or muscle aches. You develop a rash. You have trouble doing your normal activities. You have pain that gets worse and is not relieved by pain medicine. This information is not intended to replace advice given to you by your health care provider. Make sure you discuss any questions you have with your health care provider. Document Revised: 08/03/2022 Document Reviewed: 08/03/2022 Elsevier Patient Education  [place] Exercises Hand exercises can be helpful for almost anyone. These exercises can strengthen the hands, improve flexibility and movement, and increase blood flow to the hands. These results can make work and daily tasks easier. Hand exercises can be especially helpful for people who have joint pain from arthritis or have nerve damage from overuse (carpal tunnel syndrome). These exercises can also help people who have injured a hand. Exercises Most of these hand exercises are gentle stretching and motion exercises. It is usually safe to do them often throughout the day. Warming up your hands before exercise may help to reduce stiffness. You can do this with gentle massage or by placing your hands in warm water for 10-15 minutes. It is normal to feel some stretching, pulling, tightness, or mild discomfort as you begin new exercises. This will gradually improve. Stop an exercise right away if you feel sudden, severe pain or your pain gets worse. Ask your health care provider which exercises are best for you. Knuckle bend or "claw" fist  Stand or sit with your arm, hand, and all five fingers pointed straight up. Make sure to keep your wrist straight during the exercise. Gently bend your fingers down toward your palm until the tips of your fingers are touching the top of your palm. Keep your big knuckle straight and just bend the small knuckles in your  fingers. Hold this position for __________ seconds. Straighten (extend) your fingers back to the starting position. Repeat this exercise 5-10 times with each hand. Full finger fist  Stand or sit with your arm, hand, and all five fingers pointed straight up. Make sure to keep your wrist straight during the exercise. Gently bend your fingers into your palm until the tips of your fingers are touching the middle of your palm. Hold this position for __________ seconds. Extend your fingers back to the starting position, stretching every joint fully. Repeat this exercise 5-10 times with each hand. Straight fist Stand or sit with your arm, hand, and all five fingers pointed straight up. Make sure to keep your wrist straight during the exercise. Gently bend your fingers at the big knuckle, where your fingers meet your hand, and the middle knuckle. Keep the knuckle at the tips of your fingers straight and try to touch the bottom of your palm. Hold this position for __________ seconds. Extend your fingers back to the starting position, stretching every joint fully. Repeat this exercise 5-10 times with each hand.  Tabletop  Stand or sit with your arm, hand, and all five fingers pointed straight up. Make sure to keep your wrist straight during the exercise. Gently bend your fingers at the big knuckle, where your fingers meet your hand, as far down as you can while keeping the small knuckles in your fingers straight. Think of forming a tabletop with your fingers. Hold this position for __________ seconds. Extend your fingers back to the starting position, stretching every joint fully. Repeat this exercise 5-10 times with each hand. Finger spread  Place your hand flat on a table with your palm facing down. Make sure your wrist stays straight as you do this exercise. Spread your fingers and thumb apart from each other as far as you can until you feel a gentle stretch. Hold this position for __________  seconds. Bring your fingers and thumb tight together again. Hold this position for __________ seconds. Repeat this exercise 5-10 times with each hand. Making circles  Stand or sit with your arm, hand, and all five fingers pointed straight up. Make sure to keep your wrist straight during the exercise. Make a circle by touching the tip of your thumb to the tip of your index finger. Hold for __________ seconds. Then open your hand wide. Repeat this motion with your thumb and each finger on your hand. Repeat this exercise 5-10 times with each hand. Thumb motion  Sit with your forearm resting on a table and your wrist straight. Your thumb should be facing up toward the ceiling. Keep your fingers relaxed as you move your thumb. Lift your thumb up as high as you can toward the ceiling. Hold for __________ seconds. Bend your thumb across your palm as far as you can, reaching the tip of your thumb for the small finger (pinkie) side of your palm. Hold for __________ seconds. Repeat this exercise 5-10 times with each hand. Grip strengthening  Hold a stress ball or other soft ball in the middle of your hand. Slowly increase the pressure, squeezing the ball as much as you can without causing pain. Think of bringing the tips of your fingers into the middle of your palm. All of your finger joints should bend when doing this exercise. Hold your squeeze for __________ seconds, then relax. Repeat this exercise 5-10 times with each hand. Contact a health care provider if: Your hand pain or discomfort gets much worse when you do an exercise. Your hand pain or discomfort does not improve within 2 hours after you exercise. If you have any of these problems, stop doing these exercises right away. Do not do them again unless your health care provider says that you can. Get help right away if: You develop sudden, severe hand pain or swelling. If this happens, stop doing these exercises right away. Do not do them  again unless your health care provider says that you can. This information is not intended to replace advice given to you by your health care provider. Make sure you discuss any questions you have with your health care provider. Document Revised: 03/17/2021 Document Reviewed: 03/24/2021 Elsevier Patient Education  [place].

## 2023-03-22 ENCOUNTER — Encounter: Payer: Self-pay | Admitting: Internal Medicine

## 2023-03-22 LAB — ANTI-DNA ANTIBODY, DOUBLE-STRANDED: ds DNA Ab: 1 IU/mL

## 2023-03-22 LAB — C3 AND C4
C3 Complement: 117 mg/dL (ref 83–193)
C4 Complement: 19 mg/dL (ref 15–57)

## 2023-03-22 LAB — CYCLIC CITRUL PEPTIDE ANTIBODY, IGG: Cyclic Citrullin Peptide Ab: <16 UNITS

## 2023-03-22 LAB — ANTI-SCLERODERMA ANTIBODY: Scleroderma (Scl-70) (ENA) Antibody, IgG: <1 AI

## 2023-03-22 LAB — SEDIMENTATION RATE: Sed Rate: 6 mm/h (ref 0–30)

## 2023-03-22 LAB — RNP ANTIBODY: Ribonucleic Protein(ENA) Antibody, IgG: <1 AI

## 2023-03-22 LAB — ANA: Anti Nuclear Antibody (ANA): NEGATIVE

## 2023-03-22 LAB — RHEUMATOID FACTOR: Rheumatoid fact SerPl-aCnc: <14 IU/mL (ref <14)

## 2023-03-22 NOTE — Progress Notes (Signed)
Subjective:    Patient ID: Tiffany Moran, female    DOB: [birth year], 67 y.o.   MRN: [id number]     HPI Kirbi is here for follow up of her chronic medical problems.  Doing well.    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  Takes otc meds for gas, pepto bismol.  She is started eating certain foods that are making her bowels move better.  Walks daily 30 minutes.    Medications and allergies reviewed with patient and updated if appropriate.  Current Outpatient Medications on File Prior to Visit  Medication Sig Dispense Refill   ALPRAZolam (XANAX) 0.5 MG tablet TAKE 1 TABLET BY MOUTH EVERY 8 HOURS AS NEEDED FOR ANXIETY 20 tablet 3   Cholecalciferol (VITAMIN D) 2000 units CAPS Take 2,000 Units by mouth daily.     diltiazem (CARDIZEM CD) 120 MG 24 hr capsule Take 1 capsule (120 mg total) by mouth daily. 90 capsule 3   ezetimibe (ZETIA) 10 MG tablet Take 0.5 tablets (5 mg total) by mouth daily. 90 tablet 3   Loteprednol Etabonate (LOTEMAX OP) Apply to eye as needed.     metoprolol succinate (TOPROL-XL) 25 MG 24 hr tablet Take 1 tablet (25 mg total) by mouth 3 (three) times daily. (Patient taking differently: Take 25 mg by mouth 2 (two) times daily.) 270 tablet 3   rosuvastatin (CRESTOR) 20 MG tablet Take 1 tablet by mouth once daily 90 tablet 3   No current facility-administered medications on file prior to visit.     Review of Systems  Constitutional:  Negative for fever.  Eyes:  Negative for visual disturbance.  Respiratory:  Negative for cough, shortness of breath and wheezing.   Cardiovascular:  Positive for palpitations (limited in few weeks - better). Negative for chest pain and leg swelling.  Gastrointestinal:  Positive for abdominal pain (from gas) and constipation. Negative for blood in stool.       No gerd  Genitourinary:  Negative for dysuria.  Musculoskeletal:  Positive for arthralgias (hands, mild knee stiffness).  Skin:  Negative for rash.  Neurological:   Positive for light-headedness (occ with getting up quick). Negative for headaches.       Objective:   Vitals:   03/23/23 0748  BP: 120/78  Pulse: 64  Temp: 98.2 F (36.8 C)  SpO2: 97%   BP Readings from Last 3 Encounters:  03/23/23 120/78  03/19/23 121/70  03/06/23 120/82   Wt Readings from Last 3 Encounters:  03/23/23 141 lb (64 kg)  03/19/23 144 lb (65.3 kg)  03/06/23 142 lb (64.4 kg)   Body mass index is 22.76 kg/m.    Physical Exam Constitutional:      General: She is not in acute distress.    Appearance: Normal appearance.  HENT:     Head: Normocephalic and atraumatic.  Eyes:     Conjunctiva/sclera: Conjunctivae normal.  Cardiovascular:     Rate and Rhythm: Normal rate and regular rhythm.     Heart sounds: Normal heart sounds.  Pulmonary:     Effort: Pulmonary effort is normal. No respiratory distress.     Breath sounds: Normal breath sounds. No wheezing.  Abdominal:     General: There is no distension.     Palpations: Abdomen is soft.     Tenderness: There is no abdominal tenderness. There is no guarding or rebound.  Musculoskeletal:     Cervical back: Neck supple.     Right lower leg: No  edema.     Left lower leg: No edema.  Lymphadenopathy:     Cervical: No cervical adenopathy.  Skin:    General: Skin is warm and dry.     Findings: No rash.  Neurological:     Mental Status: She is alert. Mental status is at baseline.  Psychiatric:        Mood and Affect: Mood normal.        Behavior: Behavior normal.        Lab Results  Component Value Date   WBC 4.8 10/27/2022   HGB 14.2 10/27/2022   HCT 42.4 10/27/2022   PLT 154.0 10/27/2022   GLUCOSE 91 10/27/2022   CHOL 198 09/20/2021   TRIG 72 09/20/2021   HDL 76 09/20/2021   LDLCALC 109 (H) 09/20/2021   ALT 40 (H) 10/27/2022   AST 26 10/27/2022   NA 140 10/27/2022   K 4.0 10/27/2022   CL 104 10/27/2022   CREATININE 0.86 10/27/2022   BUN 14 10/27/2022   CO2 31 10/27/2022   TSH 1.71  10/27/2022   HGBA1C 5.6 10/05/2022     Assessment & Plan:    See Problem List for Assessment and Plan of chronic medical problems.

## 2023-03-22 NOTE — Progress Notes (Signed)
All the labs are within normal limits.  I will discuss results at the follow-up visit.

## 2023-03-23 ENCOUNTER — Ambulatory Visit (INDEPENDENT_AMBULATORY_CARE_PROVIDER_SITE_OTHER): Payer: Medicare Other | Admitting: Internal Medicine

## 2023-03-23 VITALS — BP 120/78 | HR 64 | Temp 98.2°F | Ht 66.0 in | Wt 141.0 lb

## 2023-03-23 DIAGNOSIS — M85852 Other specified disorders of bone density and structure, left thigh: Secondary | ICD-10-CM

## 2023-03-23 DIAGNOSIS — R739 Hyperglycemia, unspecified: Secondary | ICD-10-CM

## 2023-03-23 DIAGNOSIS — I10 Essential (primary) hypertension: Secondary | ICD-10-CM | POA: Diagnosis not present

## 2023-03-23 DIAGNOSIS — E782 Mixed hyperlipidemia: Secondary | ICD-10-CM | POA: Diagnosis not present

## 2023-03-23 DIAGNOSIS — K5904 Chronic idiopathic constipation: Secondary | ICD-10-CM

## 2023-03-23 DIAGNOSIS — E559 Vitamin D deficiency, unspecified: Secondary | ICD-10-CM | POA: Diagnosis not present

## 2023-03-23 DIAGNOSIS — I471 Supraventricular tachycardia, unspecified: Secondary | ICD-10-CM

## 2023-03-23 DIAGNOSIS — R002 Palpitations: Secondary | ICD-10-CM

## 2023-03-23 DIAGNOSIS — K5900 Constipation, unspecified: Secondary | ICD-10-CM | POA: Insufficient documentation

## 2023-03-23 DIAGNOSIS — F419 Anxiety disorder, unspecified: Secondary | ICD-10-CM

## 2023-03-23 LAB — COMPREHENSIVE METABOLIC PANEL WITH GFR
ALT: 49 U/L — ABNORMAL HIGH (ref 0–35)
AST: 33 U/L (ref 0–37)
Albumin: 4.8 g/dL (ref 3.5–5.2)
Alkaline Phosphatase: 92 U/L (ref 39–117)
BUN: 12 mg/dL (ref 6–23)
CO2: 30 meq/L (ref 19–32)
Calcium: 9.8 mg/dL (ref 8.4–10.5)
Chloride: 104 meq/L (ref 96–112)
Creatinine, Ser: 0.79 mg/dL (ref 0.40–1.20)
GFR: 77.4 mL/min
Glucose, Bld: 89 mg/dL (ref 70–99)
Potassium: 4.2 meq/L (ref 3.5–5.1)
Sodium: 141 meq/L (ref 135–145)
Total Bilirubin: 0.8 mg/dL (ref 0.2–1.2)
Total Protein: 7.3 g/dL (ref 6.0–8.3)

## 2023-03-23 LAB — LIPID PANEL
Cholesterol: 151 mg/dL (ref 0–200)
HDL: 72.9 mg/dL
LDL Cholesterol: 62 mg/dL (ref 0–99)
NonHDL: 78.32
Total CHOL/HDL Ratio: 2
Triglycerides: 84 mg/dL (ref 0.0–149.0)
VLDL: 16.8 mg/dL (ref 0.0–40.0)

## 2023-03-23 LAB — CBC WITH DIFFERENTIAL/PLATELET
Basophils Absolute: 0 10*3/uL (ref 0.0–0.1)
Basophils Relative: 0.5 % (ref 0.0–3.0)
Eosinophils Absolute: 0.1 10*3/uL (ref 0.0–0.7)
Eosinophils Relative: 1.7 % (ref 0.0–5.0)
HCT: 43.5 % (ref 36.0–46.0)
Hemoglobin: 14.6 g/dL (ref 12.0–15.0)
Lymphocytes Relative: 26.3 % (ref 12.0–46.0)
Lymphs Abs: 1 10*3/uL (ref 0.7–4.0)
MCHC: 33.6 g/dL (ref 30.0–36.0)
MCV: 90.9 fl (ref 78.0–100.0)
Monocytes Absolute: 0.2 10*3/uL (ref 0.1–1.0)
Monocytes Relative: 6.1 % (ref 3.0–12.0)
Neutro Abs: 2.6 10*3/uL (ref 1.4–7.7)
Neutrophils Relative %: 65.4 % (ref 43.0–77.0)
Platelets: 155 10*3/uL (ref 150.0–400.0)
RBC: 4.79 Mil/uL (ref 3.87–5.11)
RDW: 13.2 % (ref 11.5–15.5)
WBC: 3.9 10*3/uL — ABNORMAL LOW (ref 4.0–10.5)

## 2023-03-23 LAB — HEMOGLOBIN A1C: Hgb A1c MFr Bld: 5.6 % (ref 4.6–6.5)

## 2023-03-23 LAB — TSH: TSH: 2.91 u[IU]/mL (ref 0.35–5.50)

## 2023-03-23 LAB — VITAMIN D 25 HYDROXY (VIT D DEFICIENCY, FRACTURES): VITD: 78.71 ng/mL (ref 30.00–100.00)

## 2023-03-23 MED ORDER — METOPROLOL SUCCINATE ER 25 MG PO TB24: 25.0000 mg | ORAL_TABLET | Freq: Two times a day (BID) | ORAL | Status: DC

## 2023-03-23 NOTE — Patient Instructions (Addendum)
      Blood work was ordered.   The lab is on the first floor.     Medications changes include :   none.  Add in magnesium and calcium supplementation.       Return in about 1 year (around 03/22/2024) for follow up.

## 2023-03-23 NOTE — Assessment & Plan Note (Addendum)
Chronic DEXA up-to-date, but due this summer-ordered by her gynecologist Has severe osteopenia Stressed regular exercise Continue vitamin D daily Advised taking 500 mg of calcium a day-can get the rest of her diet

## 2023-03-23 NOTE — Assessment & Plan Note (Signed)
Chronic Continue regular exercise She is eating certain foods that help-advised that the gas pain she is getting would likely improve with improving constipation Will work on adding in other foods that will help Can try magnesium daily

## 2023-03-23 NOTE — Assessment & Plan Note (Signed)
Chronic Check a1c Low sugar / carb diet Stressed regular exercise  

## 2023-03-23 NOTE — Assessment & Plan Note (Signed)
Chronic Intermittent, situational Continue alprazolam 0.5 mg every 8 hours as needed-does not take frequently

## 2023-03-23 NOTE — Assessment & Plan Note (Signed)
Chronic Regular exercise and healthy diet encouraged Lipids controlled Managed by cardiology Continue crestor 20 mg daily

## 2023-03-23 NOTE — Assessment & Plan Note (Addendum)
Chronic Managed by cardiology-Dr. Allyson Sabal Controlled-improved Continue diltiazem 120 mg daily, metoprolol XL 25 mg twice daily

## 2023-03-23 NOTE — Assessment & Plan Note (Signed)
Chronic ?Managed by cardiology ?Blood pressure well controlled ?CMP ?Continue diltiazem 120 mg daily, metoprolol XL 25 mg twice daily ?

## 2023-03-23 NOTE — Assessment & Plan Note (Signed)
Chronic Taking vitamin D daily Check vitamin D level  

## 2023-03-28 NOTE — Progress Notes (Addendum)
Office Visit Note  Patient: Tiffany Moran             Date of Birth: 12-Jan-1956           MRN: [id number]             PCP: Pincus Sanes, MD Referring: Pincus Sanes, MD Visit Date: 04/11/2023 Occupation: @GUAROCC @  Subjective:  Discoloration of hands  History of Present Illness: Tiffany Moran is a 67 y.o. female with history of osteoarthritis and Raynauds.  She states she continues to have severe Raynauds in her hands.  She states her hands and feet always stay cold.  She also gets areas on her fingers which become very sensitive and sometimes make early ulceration like lesions.  Patient states that the dose of metoprolol was decreased which has not helped much.  She has been wearing gloves but she has not used hand warmers yet.  Her feet do stay cold at night and she wears socks.  There is no history of oral ulcers, nasal ulcers, malar rash, photosensitivity or lymphadenopathy.  She gives history of dry mouth and dry eyes.  She states her son who is in his 30s is also having similar symptoms.    Activities of Daily Living:  Patient reports morning stiffness for less than 1 minute.   Patient Denies nocturnal pain.  Difficulty dressing/grooming: Denies Difficulty climbing stairs: Denies Difficulty getting out of chair: Denies Difficulty using hands for taps, buttons, cutlery, and/or writing: Denies  Review of Systems  Constitutional:  Negative for fatigue.  HENT:  Positive for mouth dryness. Negative for mouth sores.   Eyes:  Positive for dryness.  Respiratory:  Negative for shortness of breath.   Cardiovascular:  Positive for palpitations. Negative for chest pain.  Gastrointestinal:  Negative for blood in stool, constipation and diarrhea.  Endocrine: Negative for increased urination.  Genitourinary:  Negative for involuntary urination.  Musculoskeletal:  Positive for joint pain, joint pain and morning stiffness. Negative for gait problem, joint swelling, myalgias, muscle weakness,  muscle tenderness and myalgias.  Skin:  Positive for color change. Negative for rash, hair loss and sensitivity to sunlight.  Allergic/Immunologic: Negative for susceptible to infections.  Neurological:  Negative for dizziness and headaches.  Hematological:  Negative for swollen glands.  Psychiatric/Behavioral:  Negative for depressed mood and sleep disturbance. The patient is not nervous/anxious.     PMFS History:  Patient Active Problem List   Diagnosis Date Noted   Constipation 03/23/2023   Hyperglycemia 03/09/2021   Osteoarthritis of right knee 03/08/2020   Hypertension 01/22/2019   Chronic sinusitis 09/11/2018   Arthritis of hand, degenerative 04/17/2018   Multiple thyroid nodules-stable, no follow-up needed 12/03/2017   Anxiety-situational 09/26/2017   Liver hemangioma 12/05/2016   Renal cyst, right 12/05/2016   Retinal hole of right eye 02/09/2016   Pes anserine bursitis 04/21/2014   SI (sacroiliac) joint dysfunction 04/21/2014   Osteopenia 08/04/2011   Vitamin D deficiency 08/04/2011   History of mitral valve prolapse 07/26/2010   PAROXYSMAL ATRIAL TACHYCARDIA 01/05/2010   Raynaud's syndrome 01/05/2010   Hyperlipidemia 08/17/2009   POLYARTHRITIS 08/17/2009    Past Medical History:  Diagnosis Date   Cardiospasm 1983   felt may be related to MVP   Hyperlipidemia    Hypertension    Kidney cysts 11/2015   cyst on one of her kidneys, no treatment required   Mitral valve prolapse    PONV (postoperative nausea and vomiting)    teeth chipped  Raynaud disease    Retinal hole of right eye    Shingles 2012   leg   Tachycardia     Family History  Problem Relation Age of Onset   Heart attack Father 85   Myasthenia gravis Father    Hypertension Father    Hyperlipidemia Father    Osteoporosis Father        due to steroids for Myasthenia Grvis   Leukemia Mother 34   Hyperlipidemia Mother    Hypertension Mother    Hypothyroidism Mother    Prostate cancer Brother         in 57s   Hyperlipidemia Brother    Stroke Maternal Grandfather        in 22s   Healthy Son    Healthy Daughter    Diabetes Neg Hx    Past Surgical History:  Procedure Laterality Date   COLONOSCOPY  2007   [place], [place] ,[place]   DILATATION & CURETTAGE/HYSTEROSCOPY WITH MYOSURE N/A 08/29/2016   Procedure: DILATATION & CURETTAGE/HYSTEROSCOPY;  Surgeon: Romualdo Bolk, MD;  Location: [facility];  Service: Gynecology;  Laterality: N/A;   DILATION AND CURETTAGE OF UTERUS     EXCISION OF SKIN TAG Left 08/29/2016   Procedure: EXCISION OF SKIN TAG left labia majora;  Surgeon: Romualdo Bolk, MD;  Location: [facility];  Service: Gynecology;  Laterality: Left;   EXPLORATORY LAPAROTOMY     X2 for pain: fibroid, enlarged oviduct   RETINAL LASER PROCEDURE Right 08/2015   SALPINGECTOMY  2007 or 2008   and fibroid removed   TUBAL LIGATION     Social History   Social History Narrative   Exercises regularly   Immunization History  Administered Date(s) Administered   PFIZER(Purple Top)SARS-COV-2 Vaccination 01/23/2020, 02/13/2020   Tdap 04/03/2011, 05/15/2020     Objective: Vital Signs: BP 114/67 (BP Location: Left Arm, Patient Position: Sitting, Cuff Size: Normal)   Pulse 60   Resp 13   Ht 5\' 6"  (1.676 m)   Wt 145 lb (65.8 kg)   LMP 06/17/2009 (Approximate)   BMI 23.40 kg/m    Physical Exam Vitals and nursing note reviewed.  Constitutional:      Appearance: She is well-developed.  HENT:     Head: Normocephalic and atraumatic.  Eyes:     Conjunctiva/sclera: Conjunctivae normal.  Cardiovascular:     Rate and Rhythm: Normal rate and regular rhythm.     Heart sounds: Normal heart sounds.  Pulmonary:     Effort: Pulmonary effort is normal.     Breath sounds: Normal breath sounds.  Abdominal:     General: Bowel sounds are normal.     Palpations: Abdomen is soft.  Musculoskeletal:     Cervical back: Normal range of motion.  Lymphadenopathy:     Cervical: No cervical  adenopathy.  Skin:    General: Skin is warm and dry.     Capillary Refill: Capillary refill takes more than 3 seconds.     Comments: No nailbed capillary changes, telangiectasias or sclerodactyly was noted.  Neurological:     Mental Status: She is alert and oriented to person, place, and time.  Psychiatric:        Behavior: Behavior normal.      Musculoskeletal Exam: Cervical, thoracic and lumbar spine were in good range of motion.  Shoulder joints, elbow joints, wrist joints, MCPs PIPs and DIPs were in good range of motion.  She had bilateral PIP and DIP thickening consistent with osteoarthritis.  No synovitis  was noted.  Hip joints and knee joints were in good range of motion.  There was no tenderness over ankles or MTPs.  CDAI Exam: CDAI Score: -- Patient Global: --; Provider Global: -- Swollen: --; Tender: -- Joint Exam 04/11/2023   No joint exam has been documented for this visit   There is currently no information documented on the homunculus. Go to the Rheumatology activity and complete the homunculus joint exam.  Investigation: No additional findings.  Imaging: XR Hand 2 View Left  Result Date: 03/19/2023 CMC, PIP and DIP narrowing was noted.  No MCP, intercarpal or radiocarpal joint space narrowing was noted.  No erosive changes were noted.  No radiographic progression was noted when compared to the x-rays of 2020. Impression: These x-ray findings are suggestive of osteoarthritis of the hand.  XR Hand 2 View Right  Result Date: 03/19/2023 CMC, PIP and DIP narrowing was noted.  No MCP, intercarpal or radiocarpal joint space narrowing was noted.  No erosive changes were noted.  No radiographic progression was noted when compared to the x-rays of 2020. Impression: These x-ray findings are suggestive of osteoarthritis of the hand.   Recent Labs: Lab Results  Component Value Date   WBC 3.9 (L) 03/23/2023   HGB 14.6 03/23/2023   PLT 155.0 03/23/2023   NA 141 03/23/2023   K  4.2 03/23/2023   CL 104 03/23/2023   CO2 30 03/23/2023   GLUCOSE 89 03/23/2023   BUN 12 03/23/2023   CREATININE 0.79 03/23/2023   BILITOT 0.8 03/23/2023   ALKPHOS 92 03/23/2023   AST 33 03/23/2023   ALT 49 (H) 03/23/2023   PROT 7.3 03/23/2023   ALBUMIN 4.8 03/23/2023   CALCIUM 9.8 03/23/2023   GFRAA 84 11/05/2020   March 23, 2023 C3-C4 normal, sed rate 6, RF negative, anti-CCP negative, ANA negative, SCL 70 negative, RNP negative, dsDNA negative  March 23, 2023 vitamin D78.71  Speciality Comments: No specialty comments available.  Procedures:  No procedures performed Allergies: Lipitor [atorvastatin]   Assessment / Plan:     Visit Diagnoses: Raynaud's disease without gangrene - History of severe Raynauds and feet.  She is on Cardizem CD.  She continues to have severe Raynauds involving her hands.  Her feet also stay cold.  She notices intermittent increased pain in her hands and areas of discoloration which she thinks are precursor to ulceration.  We discussed possible use of topical nitroglycerin or Nitro-Dur or tadalafil or Zoloft at length today.  Patient is concerned that SSRI may interfere with her arrhythmias.  She would like to try topical agent instead of the oral medications.  I also advised patient to discuss this further with the cardiologist as she may need more aggressive treatment during the winter months.  I will give her a prescription for nitroglycerin ointment which can be used topically as needed 3 times a day.  Side effects of topical nitroglycerin including hypertension was discussed.  I will obtain additional labs today.- Plan: Cryoglobulin, Pan-ANCA, Beta-2 glycoprotein antibodies, Cardiolipin antibodies, IgG, IgM, IgA, Lupus Anticoagulant Eval w/Reflex, nitroGLYCERIN (NITROGLYN) 2 % ointment (I also mention to the patient that tadalafil cannot be used with topical or oral nitroglycerin.  They will not add any oral agents until they are cleared by her  cardiologist.)  Dry eyes -she gives history of dry mouth and dry eyes years.  Plan: Sjogrens syndrome-A extractable nuclear antibody, Sjogrens syndrome-B extractable nuclear antibody  Primary osteoarthritis of both hands - History of inflammatory arthritis.  I do not see any synovitis on the examination.  She had bilateral PIP and DIP thickening consistent with osteoarthritis.  I advised her to contact me if she develops swelling over MCPs or wrist joints.  We may consider use of hydroxychloroquine in the future if needed.  SI (sacroiliac) joint dysfunction-patient continues to have chronic discomfort.  Primary osteoarthritis of right knee-she has intermittent discomfort in her knee joints.  No warmth swelling or effusion was noted.  DDD (degenerative disc disease), lumbar-she had discomfort in the past.  She had good mobility today.  Osteopenia of multiple sites - T-score -2.20 June 2021.  She takes calcium and vitamin D.  Other medical problems are listed as follows:  SVT (supraventricular tachycardia)-she is on metoprolol and Cardizem.  Dyslipidemia  History of mitral valve prolapse  Hyperglycemia  Anxiety  Multiple thyroid nodules  Chronic sinusitis  Orders: Orders Placed This Encounter  Procedures   Cryoglobulin   Pan-ANCA   Beta-2 glycoprotein antibodies   Cardiolipin antibodies, IgG, IgM, IgA   Lupus Anticoagulant Eval w/Reflex   Sjogrens syndrome-A extractable nuclear antibody   Sjogrens syndrome-B extractable nuclear antibody   Meds ordered this encounter  Medications   nitroGLYCERIN (NITROGLYN) 2 % ointment    Sig: Apply 0.25 inches topically 3 (three) times daily.    Dispense:  30 g    Refill:  0   .  Follow-Up Instructions: Return in about 6 months (around 10/11/2023) for raynauds.   Pollyann SavoyShaili Phoebie Shad, MD  Note - This record has been created using Animal nutritionistDragon software.  Chart creation errors have been sought, but may not always  have been located. Such  creation errors do not reflect on  the standard of medical care.

## 2023-04-11 ENCOUNTER — Ambulatory Visit: Payer: Medicare Other | Attending: Rheumatology | Admitting: Rheumatology

## 2023-04-11 ENCOUNTER — Encounter: Payer: Self-pay | Admitting: Rheumatology

## 2023-04-11 VITALS — BP 114/67 | HR 60 | Resp 13 | Ht 66.0 in | Wt 145.0 lb

## 2023-04-11 DIAGNOSIS — Z8679 Personal history of other diseases of the circulatory system: Secondary | ICD-10-CM | POA: Diagnosis present

## 2023-04-11 DIAGNOSIS — I471 Supraventricular tachycardia, unspecified: Secondary | ICD-10-CM | POA: Diagnosis present

## 2023-04-11 DIAGNOSIS — I7300 Raynaud's syndrome without gangrene: Secondary | ICD-10-CM

## 2023-04-11 DIAGNOSIS — E042 Nontoxic multinodular goiter: Secondary | ICD-10-CM

## 2023-04-11 DIAGNOSIS — M19042 Primary osteoarthritis, left hand: Secondary | ICD-10-CM | POA: Insufficient documentation

## 2023-04-11 DIAGNOSIS — M1711 Unilateral primary osteoarthritis, right knee: Secondary | ICD-10-CM

## 2023-04-11 DIAGNOSIS — E785 Hyperlipidemia, unspecified: Secondary | ICD-10-CM | POA: Diagnosis present

## 2023-04-11 DIAGNOSIS — M5136 Other intervertebral disc degeneration, lumbar region: Secondary | ICD-10-CM | POA: Insufficient documentation

## 2023-04-11 DIAGNOSIS — M8589 Other specified disorders of bone density and structure, multiple sites: Secondary | ICD-10-CM

## 2023-04-11 DIAGNOSIS — M85852 Other specified disorders of bone density and structure, left thigh: Secondary | ICD-10-CM

## 2023-04-11 DIAGNOSIS — M19041 Primary osteoarthritis, right hand: Secondary | ICD-10-CM

## 2023-04-11 DIAGNOSIS — H04123 Dry eye syndrome of bilateral lacrimal glands: Secondary | ICD-10-CM

## 2023-04-11 DIAGNOSIS — M51369 Other intervertebral disc degeneration, lumbar region without mention of lumbar back pain or lower extremity pain: Secondary | ICD-10-CM

## 2023-04-11 DIAGNOSIS — M533 Sacrococcygeal disorders, not elsewhere classified: Secondary | ICD-10-CM | POA: Diagnosis present

## 2023-04-11 DIAGNOSIS — F419 Anxiety disorder, unspecified: Secondary | ICD-10-CM

## 2023-04-11 MED ORDER — NITROGLYCERIN 2 % TD OINT
0.2500 [in_us] | TOPICAL_OINTMENT | Freq: Three times a day (TID) | TRANSDERMAL | 0 refills | Status: DC
Start: 2023-04-11 — End: 2023-08-24

## 2023-04-11 NOTE — Patient Instructions (Signed)
Raynaud's Phenomenon  Raynaud's phenomenon is a condition that affects the blood vessels (arteries) that carry blood to the fingers and toes. The arteries that supply blood to the ears, lips, nipples, or the tip of the nose might also be affected. Raynaud's phenomenon causes the arteries to become narrow temporarily (spasm). As a result, the flow of blood to the affected areas is temporarily decreased. This usually occurs in response to cold temperatures or stress. During an attack, the skin in the affected areas turns white, then blue, and finally red. A person may also feel tingling or numbness in those areas. Attacks usually last for only a brief period, and then the blood flow to the area returns to normal. In most cases, Raynaud's phenomenon does not cause serious health problems. What are the causes? In many cases, the cause of this condition is not known. The condition may occur on its own (primary Raynaud's phenomenon) or may be associated with other diseases or factors (secondary Raynaud's phenomenon). Possible causes may include: Diseases or medical conditions that damage the arteries. Injuries and repetitive actions that hurt the hands or feet. Being exposed to certain chemicals. Taking medicines that narrow the arteries. Other medical conditions, such as lupus, scleroderma, rheumatoid arthritis, thyroid problems, blood disorders, Sjogren syndrome, or atherosclerosis. What increases the risk? The following factors may make you more likely to develop this condition: Being 20-40 years old. Being female. Having a family history of Raynaud's phenomenon. Living in a cold climate. Smoking. What are the signs or symptoms? Symptoms of this condition usually occur when you are exposed to cold temperatures or when you have emotional stress. The symptoms may last for a few minutes or up to several hours. They usually affect your fingers but may also affect your toes, nipples, lips, ears, or the  tip of your nose. Symptoms may include: Changes in skin color. The skin in the affected areas will turn pale or white. The skin may then change from white to bluish to red as normal blood flow returns to the area. Numbness, tingling, or pain in the affected areas. In severe cases, symptoms may include: Skin sores. Tissues decaying and dying (gangrene). How is this diagnosed? This condition may be diagnosed based on: Your symptoms and medical history. A physical exam. During the exam, you may be asked to put your hands in cold water to check for a reaction to cold temperature. Tests, such as: Blood tests to check for other diseases or conditions. A test to check the movement of blood through your arteries and veins (vascular ultrasound). A test in which the skin at the base of your fingernail is examined under a microscope (nailfold capillaroscopy). How is this treated? During an episode, you can take actions to help symptoms go away faster. Options include moving your arms around in a windmill pattern, warming your fingers under warm water, or placing your fingers in a warm body fold, such as your armpit. Long-term treatment for this condition often involves making lifestyle changes and taking steps to control your exposure to cold temperature. For more severe cases, medicine (calcium channel blockers) may be used to improve blood circulation. Follow these instructions at home: Avoiding cold temperatures Take these steps to avoid exposure to cold: If possible, stay indoors during cold weather. When you go outside during cold weather, dress in layers and wear mittens, a hat, a scarf, and warm footwear. Wear mittens or gloves when handling ice or frozen food. Use holders for glasses or cans containing   cold drinks. Let warm water run for a while before taking a shower or bath. Warm up the car before driving in cold weather. Lifestyle If possible, avoid stressful and emotional situations. Try  to find ways to manage your stress, such as: Exercise. Yoga. Meditation. Biofeedback. Do not use any products that contain nicotine or tobacco. These products include cigarettes, chewing tobacco, and vaping devices, such as e-cigarettes. If you need help quitting, ask your health care provider. Avoid secondhand smoke. Limit your use of caffeine. Switch to decaffeinated coffee, tea, and soda. Avoid chocolate. Avoid vibrating tools and machinery. General instructions Protect your hands and feet from injuries, cuts, or bruises. Avoid wearing tight rings or wristbands. Wear loose fitting socks and comfortable, roomy shoes. Take over-the-counter and prescription medicines only as told by your health care provider. Where to find support Raynaud's Association: www.raynauds.org Where to find more information [employer] of Arthritis and Musculoskeletal and Skin Diseases: www.niams.http://www.myers.net/ Contact a health care provider if: Your discomfort becomes worse despite lifestyle changes. You develop sores on your fingers or toes that do not heal. You have breaks in the skin on your fingers or toes. You have a fever. You have pain or swelling in your joints. You have a rash. Your symptoms occur on only one side of your body. Get help right away if: Your fingers or toes turn black. You have severe pain in the affected areas. These symptoms may represent a serious problem that is an emergency. Do not wait to see if the symptoms will go away. Get medical help right away. Call your local emergency services (911 in the [place] Do not drive yourself to the hospital. Summary Raynaud's phenomenon is a condition that affects the arteries that carry blood to the fingers, toes, ears, lips, nipples, or the tip of the nose. In many cases, the cause of this condition is not known. Symptoms of this condition include changes in skin color along with numbness and tingling in the affected area. Treatment for  this condition includes lifestyle changes and reducing exposure to cold temperatures. Medicines may be used for severe cases of the condition. Contact your health care provider if your condition worsens despite treatment. This information is not intended to replace advice given to you by your health care provider. Make sure you discuss any questions you have with your health care provider. Document Revised: 02/08/2021 Document Reviewed: 02/08/2021 Elsevier Patient Education  2023 Elsevier Inc.   Tadalafil Tablets  What is this medication? TADALAFIL (tah DA la fil) treats pulmonary arterial hypertension (PAH), a condition that causes high blood pressure in the lungs. It works by relaxing your blood vessels and lowering the blood pressure in your lungs, which makes it easier for your heart to pump blood to the rest of your body. It can also help you breathe easier and be more active. This medicine may be used for other purposes; ask your health care provider or pharmacist if you have questions. COMMON BRAND NAME(S): Mady Gemma, Cialis What should I tell my care team before I take this medication? They need to know if you have any of these conditions: Eye or vision problems, including a rare inherited eye disease called retinitis pigmentosa Heart disease, angina, a history of heart attack, irregular heart beats, or other heart problems High or low blood pressure Kidney disease Liver disease Pulmonary veno-occlusive disease (PVOD) Stomach ulcers Stroke An unusual or allergic reaction to tadalafil, other medications, foods, dyes, or preservatives Pregnant or trying to get pregnant  Breast-feeding How should I use this medication? Take this medication by mouth with a glass of water. Follow the directions on the prescription label. You can take this medication with or without food. Take your medication at regular intervals. Take both tablets at the same time, one after the other. Do not split  your dose throughout the day. Do not take it more often than directed. Do not stop taking except on your care team's advice. Talk to your care team about the use of this medication in children. Special care may be needed. Overdosage: If you think you have taken too much of this medicine contact a poison control center or emergency room at once. NOTE: This medicine is only for you. Do not share this medicine with others. What if I miss a dose? If you miss a dose, take it as soon as you can. If it is almost time for your next dose, take only that dose. Do not take double or extra doses. What may interact with this medication? Do not take this medication with any of the following: Nitrates like amyl nitrite, isosorbide dinitrate, isosorbide mononitrate, nitroglycerin Other medications for erectile dysfunction like avanafil, sildenafil, vardenafil Other tadalafil products (Cialis) Riociguat This medication may also interact with the following: Certain medications for high blood pressure Certain medications for the treatment of HIV infection or AIDS Certain medications used for fungal or yeast infections, like fluconazole, itraconazole, ketoconazole, and voriconazole Certain medications used for seizures like carbamazepine, phenytoin, and phenobarbital Grapefruit juice Macrolide antibiotics like clarithromycin, erythromycin, troleandomycin Medications for prostate problems Rifabutin, rifampin or rifapentine This list may not describe all possible interactions. Give your health care provider a list of all the medicines, herbs, non-prescription drugs, or dietary supplements you use. Also tell them if you smoke, drink alcohol, or use illegal drugs. Some items may interact with your medicine. What should I watch for while using this medication? Do not change the dose of your medication without asking your care team. Call your care team if your medication is not working for you. This may be a sign that  your condition has become worse. If you notice any changes in your vision while taking this medication, call your care team as soon as possible. Stop using this medication and call your health care provider right away if you have a loss of sight in one or both eyes. For males, contact you care team right away if an erection lasts longer than 4 hours or if it becomes painful. This may be a sign of serious problem and must be treated right away to prevent permanent damage. If you experience symptoms of nausea, dizziness, chest pain, or arm pain after taking this medication, you should discuss the episode with your care team as soon as possible. Do not drink more than 4 alcohol-containing drinks in a short period of time when taking this medication. When taken in excess, alcohol can increase your chances of getting a headache or getting dizzy, increasing your heart rate or lowering your blood pressure. What side effects may I notice from receiving this medication? Side effects that you should report to your care team as soon as possible: Allergic reactions--skin rash, itching, hives, swelling of the face, lips, tongue, or throat Hearing loss or ringing in ears Heart attack--pain or tightness in the chest, shoulders, arms, or jaw, nausea, shortness of breath, cold or clammy skin, feeling faint or lightheaded Low blood pressure--dizziness, feeling faint or lightheaded, blurry vision Prolonged or painful erection Redness, blistering,  peeling, or loosening of the skin, including inside the mouth Stroke--sudden numbness or weakness of the face, arm, or leg, trouble speaking, confusion, trouble walking, loss of balance or coordination, dizziness, severe headache, change in vision Sudden vision loss in one or both eyes Side effects that usually do not require medical attention (report to your care team if they continue or are bothersome): Back pain Facial flushing or redness Headache Muscle pain Runny or  stuffy nose Upset stomach This list may not describe all possible side effects. Call your doctor for medical advice about side effects. You may report side effects to FDA at 1-800-FDA-1088. Where should I keep my medication? Keep out of the reach of children. Store at room temperature between 15 and 30 degrees C (59 and 86 degrees F). Throw away any unused medication after the expiration date. NOTE: This sheet is a summary. It may not cover all possible information. If you have questions about this medicine, talk to your doctor, pharmacist, or health care provider.  2023 Elsevier/Gold Standard (2021-02-17 00:00:00)

## 2023-04-12 LAB — SJOGRENS SYNDROME-B EXTRACTABLE NUCLEAR ANTIBODY: SSB (La) (ENA) Antibody, IgG: <1 AI

## 2023-04-15 LAB — CARDIOLIPIN ANTIBODIES, IGG, IGM, IGA: Anticardiolipin IgG: <2 GPL-U/mL (ref <20.0)

## 2023-04-15 LAB — BETA-2 GLYCOPROTEIN ANTIBODIES
Beta-2 Glyco 1 IgA: <2 U/mL (ref <20.0)
Beta-2 Glyco 1 IgM: 3 U/mL (ref <20.0)

## 2023-04-15 LAB — PAN-ANCA: Myeloperoxidase Abs: <1 AI (ref <1.0)

## 2023-04-17 LAB — CARDIOLIPIN ANTIBODIES, IGG, IGM, IGA
Anticardiolipin IgA: <2 APL-U/mL (ref <20.0)
Anticardiolipin IgM: 2.5 MPL-U/mL (ref <20.0)

## 2023-04-17 LAB — SJOGRENS SYNDROME-A EXTRACTABLE NUCLEAR ANTIBODY: SSA (Ro) (ENA) Antibody, IgG: <1 AI

## 2023-04-17 LAB — BETA-2 GLYCOPROTEIN ANTIBODIES: Beta-2 Glyco I IgG: <2 U/mL (ref <20.0)

## 2023-04-17 LAB — PAN-ANCA
ANCA SCREEN: NEGATIVE
Serine Protease 3: <1 AI (ref <1.0)

## 2023-04-17 LAB — CRYOGLOBULIN: Cryoglobulin, Qualitative Analysis: NOT DETECTED

## 2023-04-23 ENCOUNTER — Encounter: Payer: Self-pay | Admitting: Family Medicine

## 2023-04-23 ENCOUNTER — Ambulatory Visit (HOSPITAL_BASED_OUTPATIENT_CLINIC_OR_DEPARTMENT_OTHER)
Admission: RE | Admit: 2023-04-23 | Discharge: 2023-04-23 | Disposition: A | Discharge status: Home / Self Care | Payer: Medicare Other | Source: Ambulatory Visit | Attending: Family Medicine | Admitting: Family Medicine

## 2023-04-23 ENCOUNTER — Ambulatory Visit (INDEPENDENT_AMBULATORY_CARE_PROVIDER_SITE_OTHER): Payer: Medicare Other | Admitting: Family Medicine

## 2023-04-23 VITALS — BP 118/64 | HR 72 | Temp 97.7°F | Resp 20 | Ht 66.0 in | Wt 142.0 lb

## 2023-04-23 DIAGNOSIS — M79662 Pain in left lower leg: Secondary | ICD-10-CM

## 2023-04-23 DIAGNOSIS — M7989 Other specified soft tissue disorders: Secondary | ICD-10-CM | POA: Insufficient documentation

## 2023-04-23 NOTE — Progress Notes (Signed)
Assessment & Plan:  1. Pain and swelling of left lower leg - US Venous Img Lower Unilateral Left (DVT); Future   Follow up plan: Return if symptoms worsen or fail to improve.  Deliah Boston, MSN, APRN, FNP-C  Subjective:  HPI: Tiffany Moran is a 67 y.o. female presenting on 04/23/2023 for left leg changes (Noticed Thursday: edema, tightness in calf, pain all the way down - no known injury )  Patient reports left leg pain, tightness, and edema that started four days ago. Denies heat or erythema. No known injury. She has been elevating her legs when she can as this does make them feel somewhat better.    ROS: Negative unless specifically indicated above in HPI.   Relevant past medical history reviewed and updated as indicated.   Allergies and medications reviewed and updated.   Current Outpatient Medications:    ALPRAZolam (XANAX) 0.5 MG tablet, TAKE 1 TABLET BY MOUTH EVERY 8 HOURS AS NEEDED FOR ANXIETY, Disp: 20 tablet, Rfl: 3   Cholecalciferol (VITAMIN D) 2000 units CAPS, Take 2,000 Units by mouth daily., Disp: , Rfl:    diltiazem (CARDIZEM CD) 120 MG 24 hr capsule, Take 1 capsule (120 mg total) by mouth daily., Disp: 90 capsule, Rfl: 3   ezetimibe (ZETIA) 10 MG tablet, Take 0.5 tablets (5 mg total) by mouth daily., Disp: 90 tablet, Rfl: 3   Loteprednol Etabonate (LOTEMAX OP), Apply to eye as needed., Disp: , Rfl:    MAGNESIUM PO, Take 400 mg by mouth as needed., Disp: , Rfl:    metoprolol succinate (TOPROL-XL) 25 MG 24 hr tablet, Take 1 tablet (25 mg total) by mouth 2 (two) times daily., Disp: , Rfl:    rosuvastatin (CRESTOR) 20 MG tablet, Take 1 tablet by mouth once daily, Disp: 90 tablet, Rfl: 3   nitroGLYCERIN (NITROGLYN) 2 % ointment, Apply 0.25 inches topically 3 (three) times daily. (Patient not taking: Reported on 04/23/2023), Disp: 30 g, Rfl: 0  Allergies  Allergen Reactions   Lipitor [Atorvastatin] Other (See Comments)    Did not tolerate    Objective:   BP 118/64    Pulse 72   Temp 97.7 F (36.5 C)   Resp 20   Ht 5\' 6"  (1.676 m)   Wt 142 lb (64.4 kg)   LMP 06/17/2009 (Approximate)   BMI 22.92 kg/m    Physical Exam Vitals reviewed.  Constitutional:      General: She is not in acute distress.    Appearance: Normal appearance. She is not ill-appearing, toxic-appearing or diaphoretic.  HENT:     Head: Normocephalic and atraumatic.  Eyes:     General: No scleral icterus.       Right eye: No discharge.        Left eye: No discharge.     Conjunctiva/sclera: Conjunctivae normal.  Cardiovascular:     Rate and Rhythm: Normal rate.  Pulmonary:     Effort: Pulmonary effort is normal. No respiratory distress.  Musculoskeletal:        General: Normal range of motion.     Cervical back: Normal range of motion.     Right lower leg: No edema.     Left lower leg: 1+ Edema present.     Right ankle: Normal pulse.     Left ankle: Normal pulse.     Right foot: Normal pulse.     Left foot: Normal pulse.  Skin:    General: Skin is warm and dry.  Capillary Refill: Capillary refill takes less than 2 seconds.  Neurological:     General: No focal deficit present.     Mental Status: She is alert and oriented to person, place, and time. Mental status is at baseline.  Psychiatric:        Mood and Affect: Mood normal.        Behavior: Behavior normal.        Thought Content: Thought content normal.        Judgment: Judgment normal.

## 2023-04-24 ENCOUNTER — Telehealth: Payer: Self-pay | Admitting: Internal Medicine

## 2023-04-24 ENCOUNTER — Encounter: Payer: Self-pay | Admitting: Internal Medicine

## 2023-04-24 DIAGNOSIS — M7989 Other specified soft tissue disorders: Secondary | ICD-10-CM

## 2023-04-24 NOTE — Telephone Encounter (Signed)
Patient saw Deliah Boston 04/23/23 and was recommended to possibly see a vascular doctor. She would like for that referral to be put in, but she didn't know if she was supposed to ask Britney or Dr. Lawerance Bach. Best callback is [phone].

## 2023-04-25 ENCOUNTER — Encounter: Payer: Self-pay | Admitting: Cardiovascular Disease

## 2023-04-25 NOTE — Telephone Encounter (Signed)
Place referral for vascular surgery. Will inform pt with status.She had sent Dr. Lawerance Bach a msg too...Tiffany Moran

## 2023-04-25 NOTE — Telephone Encounter (Signed)
Britney the ref for Vascular is pulling up as surgery. Is that the correct?

## 2023-04-25 NOTE — Telephone Encounter (Signed)
Yes ma'am! 

## 2023-04-25 NOTE — Telephone Encounter (Addendum)
Per previous [employer] states ok for ref vascular MD, but did not give name. Do you have someone in mind? Britney responded back referral was placed, and not sent to patient w/ status.Marland KitchenRaechel Chute

## 2023-04-25 NOTE — Telephone Encounter (Signed)
Error

## 2023-04-25 NOTE — Telephone Encounter (Signed)
Okay to place referral. Dx: left leg pain and swelling.

## 2023-04-26 ENCOUNTER — Ambulatory Visit: Payer: Medicare Other | Admitting: Internal Medicine

## 2023-05-10 NOTE — Telephone Encounter (Signed)
She can try calling [facility] and see when their next appt is - if they have one sooner we can send them a referral

## 2023-05-10 NOTE — Telephone Encounter (Signed)
Patient said she couldn't get in with the vascular specialist until 6/21/204. She would like to know if there is anywhere that can get her in sooner. She would like a call back at [phone].

## 2023-05-11 NOTE — Telephone Encounter (Signed)
Message sent to patient today with info and number to call.

## 2023-05-17 ENCOUNTER — Encounter: Payer: Self-pay | Admitting: Internal Medicine

## 2023-05-31 ENCOUNTER — Other Ambulatory Visit: Payer: Self-pay | Admitting: *Deleted

## 2023-05-31 DIAGNOSIS — M7989 Other specified soft tissue disorders: Secondary | ICD-10-CM

## 2023-06-08 ENCOUNTER — Ambulatory Visit (HOSPITAL_COMMUNITY)
Admission: RE | Admit: 2023-06-08 | Discharge: 2023-06-08 | Disposition: A | Discharge status: Home / Self Care | Payer: Medicare Other | Source: Ambulatory Visit | Attending: Vascular Surgery | Admitting: Vascular Surgery

## 2023-06-08 ENCOUNTER — Ambulatory Visit (INDEPENDENT_AMBULATORY_CARE_PROVIDER_SITE_OTHER): Payer: Medicare Other | Admitting: Physician Assistant

## 2023-06-08 VITALS — BP 117/70 | HR 57 | Temp 98.1°F | Resp 16 | Ht 66.0 in | Wt 142.0 lb

## 2023-06-08 DIAGNOSIS — M7989 Other specified soft tissue disorders: Secondary | ICD-10-CM

## 2023-06-08 DIAGNOSIS — Z8249 Family history of ischemic heart disease and other diseases of the circulatory system: Secondary | ICD-10-CM

## 2023-06-08 NOTE — Progress Notes (Unsigned)
Office Note     CC:  follow up Requesting Provider:  Pincus Sanes, MD  HPI: Tiffany Moran is a 67 y.o. ([birth year]) female who presents for evaluation of left leg spider veins.  Patient reports an increase in prominence of spider veins over the past several months.  She also has tenderness along the inside of her left lower leg.  She does not wear compression or elevate her legs during the day.  She denies any history of DVT, venous ulcerations, trauma, or prior vascular intervention.  Family history of AAA in her father.  He however did not require repair.  She denies any changing abdominal or back pain.  She denies tobacco use.   Past Medical History:  Diagnosis Date   Cardiospasm 1983   felt may be related to MVP   Hyperlipidemia    Hypertension    Kidney cysts 11/2015   cyst on one of her kidneys, no treatment required   Mitral valve prolapse    PONV (postoperative nausea and vomiting)    teeth chipped   Raynaud disease    Retinal hole of right eye    Shingles 2012   leg   Tachycardia     Past Surgical History:  Procedure Laterality Date   COLONOSCOPY  2007   negatve, [place] ,[place]   DILATATION & CURETTAGE/HYSTEROSCOPY WITH MYOSURE N/A 08/29/2016   Procedure: DILATATION & CURETTAGE/HYSTEROSCOPY;  Surgeon: Romualdo Bolk, MD;  Location: [facility];  Service: Gynecology;  Laterality: N/A;   DILATION AND CURETTAGE OF UTERUS     EXCISION OF SKIN TAG Left 08/29/2016   Procedure: EXCISION OF SKIN TAG left labia majora;  Surgeon: Romualdo Bolk, MD;  Location: [facility];  Service: Gynecology;  Laterality: Left;   EXPLORATORY LAPAROTOMY     X2 for pain: fibroid, enlarged oviduct   RETINAL LASER PROCEDURE Right 08/2015   SALPINGECTOMY  2007 or 2008   and fibroid removed   TUBAL LIGATION      Social History   Socioeconomic History   Marital status: Married    Spouse name: Not on file   Number of children: Not on file   Years of education: Not on file   Highest  education level: Not on file  Occupational History   Not on file  Tobacco Use   Smoking status: Never    Passive exposure: Never   Smokeless tobacco: Never  Vaping Use   Vaping Use: Never used  Substance and Sexual Activity   Alcohol use: No   Drug use: No   Sexual activity: Yes    Birth control/protection: Other-see comments, Post-menopausal    Comment: BTL  Other Topics Concern   Not on file  Social History Narrative   Exercises regularly   Social Determinants of Health   Financial Resource Strain: Not on file  Food Insecurity: Not on file  Transportation Needs: Not on file  Physical Activity: Not on file  Stress: Not on file  Social Connections: Not on file  Intimate Partner Violence: Not on file    Family History  Problem Relation Age of Onset   Heart attack Father 48   Myasthenia gravis Father    Hypertension Father    Hyperlipidemia Father    Osteoporosis Father        due to steroids for Myasthenia Grvis   Leukemia Mother 48   Hyperlipidemia Mother    Hypertension Mother    Hypothyroidism Mother    Prostate cancer Brother  in 96s   Hyperlipidemia Brother    Stroke Maternal Grandfather        in 44s   Healthy Son    Healthy Daughter    Diabetes Neg Hx     Current Outpatient Medications  Medication Sig Dispense Refill   ALPRAZolam (XANAX) 0.5 MG tablet TAKE 1 TABLET BY MOUTH EVERY 8 HOURS AS NEEDED FOR ANXIETY 20 tablet 3   Cholecalciferol (VITAMIN D) 2000 units CAPS Take 2,000 Units by mouth daily.     diltiazem (CARDIZEM CD) 120 MG 24 hr capsule Take 1 capsule (120 mg total) by mouth daily. 90 capsule 3   ezetimibe (ZETIA) 10 MG tablet Take 0.5 tablets (5 mg total) by mouth daily. 90 tablet 3   Loteprednol Etabonate (LOTEMAX OP) Apply to eye as needed.     MAGNESIUM PO Take 400 mg by mouth as needed.     metoprolol succinate (TOPROL-XL) 25 MG 24 hr tablet Take 1 tablet (25 mg total) by mouth 2 (two) times daily.     rosuvastatin (CRESTOR) 20  MG tablet Take 1 tablet by mouth once daily 90 tablet 3   nitroGLYCERIN (NITROGLYN) 2 % ointment Apply 0.25 inches topically 3 (three) times daily. (Patient not taking: Reported on 04/23/2023) 30 g 0   No current facility-administered medications for this visit.    Allergies  Allergen Reactions   Lipitor [Atorvastatin] Other (See Comments)    Did not tolerate     REVIEW OF SYSTEMS:   [X]  denotes positive finding, [ ]  denotes negative finding Cardiac  Comments:  Chest pain or chest pressure:    Shortness of breath upon exertion:    Short of breath when lying flat:    Irregular heart rhythm:        Vascular    Pain in calf, thigh, or hip brought on by ambulation:    Pain in feet at night that wakes you up from your sleep:     Blood clot in your veins:    Leg swelling:         Pulmonary    Oxygen at home:    Productive cough:     Wheezing:         Neurologic    Sudden weakness in arms or legs:     Sudden numbness in arms or legs:     Sudden onset of difficulty speaking or slurred speech:    Temporary loss of vision in one eye:     Problems with dizziness:         Gastrointestinal    Blood in stool:     Vomited blood:         Genitourinary    Burning when urinating:     Blood in urine:        Psychiatric    Major depression:         Hematologic    Bleeding problems:    Problems with blood clotting too easily:        Skin    Rashes or ulcers:        Constitutional    Fever or chills:      PHYSICAL EXAMINATION:  Vitals:   06/08/23 1223  BP: 117/70  Pulse: (!) 57  Resp: 16  Temp: 98.1 F (36.7 C)  TempSrc: Temporal  SpO2: 97%  Weight: 142 lb (64.4 kg)  Height: 5\' 6"  (1.676 m)    General:  WDWN in NAD; vital signs documented above Gait: Not observed HENT:  WNL, normocephalic Pulmonary: normal non-labored breathing , without Rales, rhonchi,  wheezing Cardiac: regular HR Abdomen: soft, NT, no masses Skin: without rashes Vascular Exam/Pulses:  palpable ATA pulses Extremities: without ischemic changes, without Gangrene , without cellulitis; without open wounds; no significant edema; some spider veins of the left medial lower leg and right lateral lower leg; no ropey varicosities Musculoskeletal: no muscle wasting or atrophy  Neurologic: A&O X 3 Psychiatric:  The pt has Normal affect.   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 female here for evaluation of spider veins mainly concerning the left leg  -Left lower extremity venous reflux study was negative for DVT.  Also negative for deep venous reflux.  She does have some superficial reflux in the GSV however it is small in diameter and would not be a candidate for laser ablation therapy.  Recommendations included knee-high 15 to 20 mmHg compression to be worn on a regular basis.  She should also elevate her legs above the level of her heart periodically throughout the day.  She will also avoid prolonged sitting and standing.  She also has a family history of AAA.  She will return 4 to 6 weeks for AAA screening.  She will otherwise follow-up on an as-needed basis.   Emilie Rutter, PA-C [facility] [phone]  Clinic MD:   Myra Gianotti

## 2023-06-12 ENCOUNTER — Encounter: Payer: Self-pay | Admitting: Physician Assistant

## 2023-06-16 ENCOUNTER — Other Ambulatory Visit: Payer: Self-pay

## 2023-06-16 DIAGNOSIS — Z8249 Family history of ischemic heart disease and other diseases of the circulatory system: Secondary | ICD-10-CM

## 2023-06-19 ENCOUNTER — Telehealth: Payer: Self-pay | Admitting: Internal Medicine

## 2023-06-19 NOTE — Telephone Encounter (Signed)
Patient went to urgent care a dew weeks ago for a tick bite removal. She was put on antibiotics, but she is concerned because the bite area is still a little raised. She wants to know what Dr. Lawerance Bach recommends. Patient would like a call back at [phone].

## 2023-06-19 NOTE — Telephone Encounter (Signed)
Left message for patient.  If she still has concerns she will need to come in and be seen.  If she calls back please schedule appointment.

## 2023-07-11 ENCOUNTER — Ambulatory Visit (INDEPENDENT_AMBULATORY_CARE_PROVIDER_SITE_OTHER): Payer: Medicare Other | Admitting: Family Medicine

## 2023-07-11 ENCOUNTER — Encounter: Payer: Self-pay | Admitting: Family Medicine

## 2023-07-11 VITALS — BP 144/80 | HR 70 | Temp 98.2°F | Ht 66.0 in | Wt 140.6 lb

## 2023-07-11 DIAGNOSIS — R209 Unspecified disturbances of skin sensation: Secondary | ICD-10-CM

## 2023-07-11 NOTE — Patient Instructions (Signed)
Watch for any facial weakness, speech changes, focal extremity weakness.   Let me know if not resolving in next couple of weeks.

## 2023-07-11 NOTE — Progress Notes (Unsigned)
Established Patient Office Visit  Subjective   Patient ID: Tiffany Moran, female    DOB: 09-30-1956  Age: 67 y.o. MRN: [id number]  Chief Complaint  Patient presents with   Facial Droop    HPI  {History (Optional):23778} Tiffany Moran is seen as a work and with 1 week history of what she describes as a "heavy "feeling left upper lateral lip.  No definite weakness.  No slurred speech.  Denies any sensory impairment or numbness.  No recent headaches.  No visual changes.  No focal extremity weakness.  No ataxia.  No recent injury.  No dental work recently.  She has no history of cerebrovascular disease.  No recent TIA or strokelike symptoms.  She does recall pulling off a small deer tick back in May but has had no headache, rash, fever, or arthralgias.  Past Medical History:  Diagnosis Date   Cardiospasm 1983   felt may be related to MVP   Hyperlipidemia    Hypertension    Kidney cysts 11/2015   cyst on one of her kidneys, no treatment required   Mitral valve prolapse    PONV (postoperative nausea and vomiting)    teeth chipped   Raynaud disease    Retinal hole of right eye    Shingles 2012   leg   Tachycardia    Past Surgical History:  Procedure Laterality Date   COLONOSCOPY  2007   negatve, [place] ,[place]   DILATATION & CURETTAGE/HYSTEROSCOPY WITH MYOSURE N/A 08/29/2016   Procedure: DILATATION & CURETTAGE/HYSTEROSCOPY;  Surgeon: Romualdo Bolk, MD;  Location: [facility];  Service: Gynecology;  Laterality: N/A;   DILATION AND CURETTAGE OF UTERUS     EXCISION OF SKIN TAG Left 08/29/2016   Procedure: EXCISION OF SKIN TAG left labia majora;  Surgeon: Romualdo Bolk, MD;  Location: [facility];  Service: Gynecology;  Laterality: Left;   EXPLORATORY LAPAROTOMY     X2 for pain: fibroid, enlarged oviduct   RETINAL LASER PROCEDURE Right 08/2015   SALPINGECTOMY  2007 or 2008   and fibroid removed   TUBAL LIGATION      reports that she has never smoked. She has never been exposed to  tobacco smoke. She has never used smokeless tobacco. She reports that she does not drink alcohol and does not use drugs. family history includes Healthy in her daughter and son; Heart attack (age of onset: 82) in her father; Hyperlipidemia in her brother, father, and mother; Hypertension in her father and mother; Hypothyroidism in her mother; Leukemia (age of onset: 42) in her mother; Myasthenia gravis in her father; Osteoporosis in her father; Prostate cancer in her brother; Stroke in her maternal grandfather. Allergies  Allergen Reactions   Lipitor [Atorvastatin] Other (See Comments)    Did not tolerate     Past medical history significant for hypertension, Raynaud's syndrome, hyperlipidemia.  She recently saw a rheumatologist and had multiple autoimmune screens which were negative. Review of Systems  Constitutional:  Negative for malaise/fatigue.  Eyes:  Negative for blurred vision.  Respiratory:  Negative for shortness of breath.   Cardiovascular:  Negative for chest pain.  Neurological:  Negative for dizziness, weakness and headaches.      Objective:     BP (!) 144/80 (BP Location: Left Arm, Patient Position: Sitting, Cuff Size: Normal)   Pulse 70   Temp 98.2 F (36.8 C) (Oral)   Ht 5\' 6"  (1.676 m)   Wt 140 lb 9.6 oz (63.8 kg)   LMP 06/17/2009 (Approximate)  SpO2 99%   BMI 22.69 kg/m  {Vitals History (Optional):23777}  Physical Exam Vitals reviewed.  Constitutional:      General: She is not in acute distress.    Appearance: She is well-developed. She is not ill-appearing.  Eyes:     Pupils: Pupils are equal, round, and reactive to light.  Neck:     Thyroid: No thyromegaly.     Vascular: No JVD.  Cardiovascular:     Rate and Rhythm: Normal rate and regular rhythm.     Heart sounds:     No gallop.  Pulmonary:     Effort: Pulmonary effort is normal. No respiratory distress.     Breath sounds: Normal breath sounds. No wheezing or rales.  Musculoskeletal:      Cervical back: Neck supple.  Neurological:     General: No focal deficit present.     Mental Status: She is alert.     Comments: Cranial nerves II through XII intact.  No obvious facial weakness.  No sensory impairment to touch.  Extraocular movements normal. Normal finger-to-nose and normal gait.  No focal strength deficits.      No results found for any visits on 07/11/23.  {Labs (Optional):23779}  The 10-year ASCVD risk score (Arnett DK, et al., 2019) is: 9.4%    Assessment & Plan:   Problem List Items Addressed This Visit   None   No follow-ups on file.    Tiffany Peat, MD

## 2023-07-16 ENCOUNTER — Encounter: Payer: Self-pay | Admitting: Family Medicine

## 2023-07-16 ENCOUNTER — Other Ambulatory Visit: Payer: Self-pay | Admitting: Internal Medicine

## 2023-07-16 ENCOUNTER — Encounter: Payer: Self-pay | Admitting: Neurology

## 2023-07-16 DIAGNOSIS — R202 Paresthesia of skin: Secondary | ICD-10-CM

## 2023-07-20 ENCOUNTER — Ambulatory Visit: Payer: Medicare Other

## 2023-07-20 ENCOUNTER — Other Ambulatory Visit (HOSPITAL_COMMUNITY): Payer: Medicare Other

## 2023-07-23 NOTE — Progress Notes (Signed)
Initial neurology clinic note  Tiffany Moran MRN: [id number] DOB: 22-Jun-1956  Referring provider: Kristian Covey, MD  Primary care provider: Pincus Sanes, MD  Reason for consult:  lip paresthesia  Subjective:  This is Ms. Tiffany Moran, a 67 y.o. right-handed female with a medical history of HTN, HLD, MVP, shingles, Raynaud's disease who presents to neurology clinic with lip paresthesia. The patient is alone today.  Patient's symptoms started on 07/05/23. In the evening she felt like her left lip was heavy. She first noticed it when eating. She denies difficulty chewing, swallowing, or tasting. She feels like she could drool. She woke up the other night and did see some saliva on the pillow while laying on her left side. She has not had any recent dental work, but did see her dentist who did not see any dental issues. She also saw dermatology who did not find any skin issues.   She feels like the left side of her face has less sensation, cool sensation from the outside of her left eye to the left upper lip. She feels there may be more of a crease around her mouth on her left. She denies any significant headache. She denies vision changes. She denies ptosis, diplopia. She denies numbness, tingling, or weakness in any extremities. She denies aphasia. She denies imbalance, dizziness, or falls.  She has infrequent neck pain, perhaps with some increase in neck tightness lately.  Patient stays active with daily walking. She is a retired Engineer, civil (consulting).  She is a never smoker. She does not drink EtOH.  Father had myasthenia gravis. Patient also has a history of stroke on the maternal side of the family.  MEDICATIONS:  Outpatient Encounter Medications as of 07/25/2023  Medication Sig Note   ALPRAZolam (XANAX) 0.5 MG tablet TAKE 1 TABLET BY MOUTH EVERY 8 HOURS AS NEEDED FOR ANXIETY    Cholecalciferol (VITAMIN D) 2000 units CAPS Take 2,000 Units by mouth daily.    diltiazem (CARDIZEM CD) 120 MG 24 hr  capsule Take 1 capsule (120 mg total) by mouth daily.    ezetimibe (ZETIA) 10 MG tablet Take 0.5 tablets (5 mg total) by mouth daily.    Loteprednol Etabonate (LOTEMAX OP) Apply to eye as needed.    metoprolol succinate (TOPROL-XL) 25 MG 24 hr tablet Take 1 tablet (25 mg total) by mouth 2 (two) times daily.    rosuvastatin (CRESTOR) 20 MG tablet Take 1 tablet by mouth once daily    MAGNESIUM PO Take 400 mg by mouth as needed. (Patient not taking: Reported on 07/25/2023)    nitroGLYCERIN (NITROGLYN) 2 % ointment Apply 0.25 inches topically 3 (three) times daily. (Patient not taking: Reported on 07/11/2023) 04/23/2023: Hasn't filled yet    No facility-administered encounter medications on file as of 07/25/2023.    PAST MEDICAL HISTORY: Past Medical History:  Diagnosis Date   Cardiospasm 1983   felt may be related to MVP   Hyperlipidemia    Hypertension    Kidney cysts 11/2015   cyst on one of her kidneys, no treatment required   Mitral valve prolapse    PONV (postoperative nausea and vomiting)    teeth chipped   Raynaud disease    Retinal hole of right eye    Shingles 2012   leg   Tachycardia     PAST SURGICAL HISTORY: Past Surgical History:  Procedure Laterality Date   COLONOSCOPY  2007   negatve, [place] ,[place]   DILATATION & CURETTAGE/HYSTEROSCOPY WITH MYOSURE N/A  08/29/2016   Procedure: DILATATION & CURETTAGE/HYSTEROSCOPY;  Surgeon: Romualdo Bolk, MD;  Location: [facility];  Service: Gynecology;  Laterality: N/A;   DILATION AND CURETTAGE OF UTERUS     EXCISION OF SKIN TAG Left 08/29/2016   Procedure: EXCISION OF SKIN TAG left labia majora;  Surgeon: Romualdo Bolk, MD;  Location: [facility];  Service: Gynecology;  Laterality: Left;   EXPLORATORY LAPAROTOMY     X2 for pain: fibroid, enlarged oviduct   RETINAL LASER PROCEDURE Right 08/2015   SALPINGECTOMY  2007 or 2008   and fibroid removed   TUBAL LIGATION      ALLERGIES: Allergies  Allergen Reactions   Lipitor  [Atorvastatin] Other (See Comments)    Did not tolerate    FAMILY HISTORY: Family History  Problem Relation Age of Onset   Heart attack Father 71   Myasthenia gravis Father    Hypertension Father    Hyperlipidemia Father    Osteoporosis Father        due to steroids for Myasthenia Grvis   Leukemia Mother 40   Hyperlipidemia Mother    Hypertension Mother    Hypothyroidism Mother    Prostate cancer Brother        in 78s   Hyperlipidemia Brother    Stroke Maternal Grandfather        in 31s   Healthy Son    Healthy Daughter    Diabetes Neg Hx     SOCIAL HISTORY: Social History   Tobacco Use   Smoking status: Never    Passive exposure: Never   Smokeless tobacco: Never  Vaping Use   Vaping status: Never Used  Substance Use Topics   Alcohol use: No   Drug use: No   Social History   Social History Narrative   Exercises regularly   Are you right handed or left handed? Right   Are you currently employed ? 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 husband   What type of home do you live in: 1 story or 2 story? two    Caffeine one a day    Objective:  Vital Signs:  BP 136/70   Pulse 68   Ht 5\' 6"  (1.676 m)   Wt 144 lb 9.6 oz (65.6 kg)   LMP 06/17/2009 (Approximate)   SpO2 99%   BMI 23.34 kg/m   General: No acute distress.  Patient appears well-groomed.   Head:  Normocephalic/atraumatic Neck: supple, no paraspinal tenderness, full range of motion Back: No paraspinal tenderness Heart: regular rate and rhythm Lungs: Clear to auscultation bilaterally. Vascular: No carotid bruits.  Neurological Exam: Mental status: alert and oriented, speech fluent and not dysarthric, language intact.  Cranial nerves: CN I: not tested CN II: pupils equal, round and reactive to light, visual fields intact CN III, IV, VI:  full range of motion, no nystagmus, no ptosis CN V: facial sensation is 95% of normal in V1  and V2 distribution CN VII: upper and lower facial muscles are normal strength (orbicularis oris and orbicularis oculi) but mild left facial asymmetry when smiling CN VIII: hearing intact CN IX, X: uvula midline CN XI: sternocleidomastoid and trapezius muscles intact CN XII: tongue midline  Bulk & Tone: normal, no fasciculations. Motor:  muscle strength 5/5 throughout Deep Tendon Reflexes:  2+ throughout,  toes downgoing.   Sensation:  Pinprick sensation intact. Finger to nose testing:  Without  dysmetria.    Gait:  Normal station and stride.  Romberg negative.   Labs and Imaging review: Internal labs: Lab Results  Component Value Date   HGBA1C 5.6 03/23/2023   No results found for: "VITAMINB12" Lab Results  Component Value Date   TSH 2.91 03/23/2023   Lab Results  Component Value Date   ESRSEDRATE 6 03/19/2023   SSA/SSB (04/11/23): negative ANCA (04/11/23): negative CBC (03/23/23) unremarkable CMP (03/23/23) mildly elevated ALT (49) Vit D (03/23/23): wnl Lipid panel (03/23/23):  Component     Latest Ref Rng 03/23/2023  Cholesterol     0 - 200 mg/dL 865   Triglycerides     0.0 - 149.0 mg/dL 78.4   HDL Cholesterol     >39.00 mg/dL 69.62   VLDL     0.0 - 40.0 mg/dL 95.2   LDL (calc)     0 - 99 mg/dL 62   Total CHOL/HDL Ratio 2   NonHDL 78.32    03/19/23: dsDNA negative RNP ab negative Scl-70 negative ANA negative CCP negative RF negative ESR wnl  Assessment/Plan:  Tiffany Moran is a 67 y.o. female who presents for evaluation of left facial numbness and asymmetry. She has a relevant medical history of HTN, HLD, MVP, shingles, Raynaud's disease. Her neurological examination is pertinent for mild left facial asymmetry when smiling and mildly diminished sensation on the left V1 and V2 distribution of face. Available diagnostic data is significant for normal HbA1c, TSH, and lipid panel (LDL 62). The etiology of patient's symptoms is currently unclear. Given that she could state  the exact date and time of symptom onset, this does appear to be an acute neurologic deficit, the most concerning of which would be stroke. This should certainly be investigated. It is also possible that symptoms could be related to cranial neuropathy, though it would involve the facial and trigeminal nerve, which would be unusual. Symptoms could also be secondary to MSK neck pain, though facial asymmetry would be odd (though this asymmetry could be normal for patient).  PLAN: -MRI brain w/wo contrast -Could consider PT for neck tightness if MRI is unrevealing -Will start Asa 81 mg daily in case symptoms are ischemic in nature -Stroke warning signs discussed and given in AVS  -Return to clinic to be determined after MRI  The impression above as well as the plan as outlined below were extensively discussed with the patient who voiced understanding. All questions were answered to their satisfaction.  When available, results of the above investigations and possible further recommendations will be communicated to the patient via telephone/MyChart. Patient to call office if not contacted after expected testing turnaround time.   Total time spent reviewing records, interview, history/exam, documentation, and coordination of care on day of encounter:  50 min   Thank you for allowing me to participate in patient's care.  If I can answer any additional questions, I would be pleased to do so.  Jacquelyne Balint, MD   CC: Pincus Sanes, MD [address] [place] [address]  CC: Referring provider: Kristian Covey, MD [address] [place],  [place] [address]

## 2023-07-25 ENCOUNTER — Encounter: Payer: Self-pay | Admitting: Neurology

## 2023-07-25 ENCOUNTER — Ambulatory Visit: Payer: Medicare Other | Admitting: Neurology

## 2023-07-25 VITALS — BP 136/70 | HR 68 | Ht 66.0 in | Wt 144.6 lb

## 2023-07-25 DIAGNOSIS — Q670 Congenital facial asymmetry: Secondary | ICD-10-CM

## 2023-07-25 DIAGNOSIS — R200 Anesthesia of skin: Secondary | ICD-10-CM

## 2023-07-25 MED ORDER — ASPIRIN 81 MG PO CHEW
81.0000 mg | CHEWABLE_TABLET | Freq: Every day | ORAL | Status: DC
Start: 2023-07-25 — End: 2023-08-24

## 2023-07-25 NOTE — Patient Instructions (Signed)
I saw you today for left face numbness and asymmetry.  I am not sure the cause of your symptoms, but the most concerning cause would be a stroke. I would like to get an MRI brain to investigate this. MRI could also show something touching the nerves going to the left side of the face.  For now, I would like you to take aspirin 81 mg daily until we know if this is stroke related, just in case.  I will be in touch when I have the results of your MRI. If you do not hear from anyone to schedule your MRI in 1-2 weeks, please let us know.  If you have new difficulty speaking, face droop, numbness on one side of the body, weakness on one side of the body, or dizziness/imbalance, this could be the sign of a stroke. Don't wait, please call EMS and be evaluated at the nearest emergency room.  The physicians and staff at [facility] are committed to providing excellent care. You may receive a survey requesting feedback about your experience at our office. We strive to receive "very good" responses to the survey questions. If you feel that your experience would prevent you from giving the office a "very good " response, please contact our office to try to remedy the situation. We may be reached at [phone]. Thank you for taking the time out of your busy day to complete the survey.  Jacquelyne Balint, MD [facility]

## 2023-07-25 NOTE — Addendum Note (Signed)
Addended by: Lenise Herald on: 07/25/2023 03:13 PM   Modules accepted: Orders

## 2023-08-07 ENCOUNTER — Ambulatory Visit
Admission: RE | Admit: 2023-08-07 | Discharge: 2023-08-07 | Disposition: A | Discharge status: Home / Self Care | Payer: Medicare Other | Source: Ambulatory Visit | Attending: Neurology | Admitting: Neurology

## 2023-08-07 DIAGNOSIS — R200 Anesthesia of skin: Secondary | ICD-10-CM

## 2023-08-07 DIAGNOSIS — Q670 Congenital facial asymmetry: Secondary | ICD-10-CM

## 2023-08-07 MED ORDER — GADOPICLENOL 0.5 MMOL/ML IV SOLN
7.0000 mL | Freq: Once | INTRAVENOUS | Status: AC | PRN
  Administered 2023-08-07: 7 mL via INTRAVENOUS

## 2023-08-08 ENCOUNTER — Encounter (HOSPITAL_BASED_OUTPATIENT_CLINIC_OR_DEPARTMENT_OTHER): Payer: Self-pay | Admitting: Obstetrics & Gynecology

## 2023-08-08 ENCOUNTER — Ambulatory Visit: Payer: Medicare Other | Admitting: Neurology

## 2023-08-09 NOTE — Telephone Encounter (Signed)
[employer] and requested results. Awaiting to receive them. Sherrilyn Rist CMA

## 2023-08-14 ENCOUNTER — Other Ambulatory Visit: Payer: Self-pay

## 2023-08-14 ENCOUNTER — Other Ambulatory Visit: Payer: Self-pay | Admitting: Internal Medicine

## 2023-08-14 ENCOUNTER — Encounter (HOSPITAL_BASED_OUTPATIENT_CLINIC_OR_DEPARTMENT_OTHER): Payer: Self-pay | Admitting: *Deleted

## 2023-08-14 ENCOUNTER — Encounter: Payer: Self-pay | Admitting: Neurology

## 2023-08-14 DIAGNOSIS — M542 Cervicalgia: Secondary | ICD-10-CM

## 2023-08-14 DIAGNOSIS — M8580 Other specified disorders of bone density and structure, unspecified site: Secondary | ICD-10-CM

## 2023-08-14 DIAGNOSIS — M8588 Other specified disorders of bone density and structure, other site: Secondary | ICD-10-CM

## 2023-08-15 ENCOUNTER — Ambulatory Visit: Payer: Medicare Other | Admitting: Podiatry

## 2023-08-24 ENCOUNTER — Ambulatory Visit (HOSPITAL_COMMUNITY)
Admission: RE | Admit: 2023-08-24 | Discharge: 2023-08-24 | Disposition: A | Discharge status: Home / Self Care | Payer: Medicare Other | Source: Ambulatory Visit | Attending: Vascular Surgery | Admitting: Vascular Surgery

## 2023-08-24 ENCOUNTER — Ambulatory Visit (INDEPENDENT_AMBULATORY_CARE_PROVIDER_SITE_OTHER): Payer: Medicare Other | Admitting: Physician Assistant

## 2023-08-24 VITALS — BP 123/66 | HR 58 | Temp 97.5°F | Wt 143.0 lb

## 2023-08-24 DIAGNOSIS — Z136 Encounter for screening for cardiovascular disorders: Secondary | ICD-10-CM

## 2023-08-24 DIAGNOSIS — Z8249 Family history of ischemic heart disease and other diseases of the circulatory system: Secondary | ICD-10-CM | POA: Insufficient documentation

## 2023-08-24 NOTE — Progress Notes (Signed)
Office Note   History of Present Illness   Tiffany Moran is a 67 y.o. ([birth year]) female who presents for screening of AAA.  She has previously been seen by our office for prominent left leg spider veins.  She was not a candidate for vein ablation and was recommended conservative treatment for her spider veins, including leg elevation, avoiding prolonged sitting and standing, and compression stockings.  She has a family history of AAA without rupture.  She returns today for screening.  She denies any issues with chest or abdominal pain.  She does have issues with back pain, however this is chronic due to disc issues.  She is followed by an orthopedic doctor for this.  Current Outpatient Medications  Medication Sig Dispense Refill   ALPRAZolam (XANAX) 0.5 MG tablet TAKE 1 TABLET BY MOUTH EVERY 8 HOURS AS NEEDED FOR ANXIETY 20 tablet 0   Cholecalciferol (VITAMIN D) 2000 units CAPS Take 2,000 Units by mouth daily.     diltiazem (CARDIZEM CD) 120 MG 24 hr capsule Take 1 capsule (120 mg total) by mouth daily. 90 capsule 3   ezetimibe (ZETIA) 10 MG tablet Take 1/2 (one-half) tablet by mouth once daily 45 tablet 0   Loteprednol Etabonate (LOTEMAX OP) Apply to eye as needed.     metoprolol succinate (TOPROL-XL) 25 MG 24 hr tablet Take 1 tablet (25 mg total) by mouth 2 (two) times daily.     rosuvastatin (CRESTOR) 20 MG tablet Take 1 tablet by mouth once daily 90 tablet 3   No current facility-administered medications for this visit.    REVIEW OF SYSTEMS (negative unless checked):   Cardiac:  []  Chest pain or chest pressure? []  Shortness of breath upon activity? []  Shortness of breath when lying flat? []  Irregular heart rhythm?  Vascular:  []  Pain in calf, thigh, or hip brought on by walking? []  Pain in feet at night that wakes you up from your sleep? []  Blood clot in your veins? []  Leg swelling?  Pulmonary:  []  Oxygen at home? []  Productive cough? []  Wheezing?  Neurologic:  []   Sudden weakness in arms or legs? []  Sudden numbness in arms or legs? []  Sudden onset of difficult speaking or slurred speech? []  Temporary loss of vision in one eye? []  Problems with dizziness?  Gastrointestinal:  []  Blood in stool? []  Vomited blood?  Genitourinary:  []  Burning when urinating? []  Blood in urine?  Psychiatric:  []  Major depression  Hematologic:  []  Bleeding problems? []  Problems with blood clotting?  Dermatologic:  []  Rashes or ulcers?  Constitutional:  []  Fever or chills?  Ear/Nose/Throat:  []  Change in hearing? []  Nose bleeds? []  Sore throat?  Musculoskeletal:  [x]  Back pain? []  Joint pain? []  Muscle pain?   Physical Examination   Vitals:   08/24/23 0832  BP: 123/66  Pulse: (!) 58  Temp: (!) 97.5 F (36.4 C)  TempSrc: Temporal  SpO2: 98%  Weight: 143 lb (64.9 kg)   Body mass index is 23.08 kg/m.  General:  WDWN in NAD; vital signs documented above Gait: Not observed HENT: WNL, normocephalic Pulmonary: normal non-labored breathing , without rales, rhonchi,  wheezing Cardiac: regular Abdomen: soft, NT, no masses.  Nonpalpable abdominal aortic pulse Skin: without rashes Vascular Exam/Pulses: palpable pedal pulses Extremities: without ischemic changes, without gangrene , without cellulitis; without open wounds;  Musculoskeletal: no muscle wasting or atrophy  Neurologic: A&O X 3;  No focal weakness or paresthesias are detected Psychiatric:  The pt has  Normal affect.   Non-Invasive Vascular Imaging   AAA Duplex (08/24/2023) No abdominal or iliac artery aneurysms visualized. Maximum diameter of abdominal aorta is 2.3cm.   Medical Decision Making   Alizayah Nedley is a 67 y.o. (August 12, 1956) female who presents for AAA screening  The patient has a significant family history of AAA.  She presents today for screening.  Her duplex demonstrates no abdominal or iliac artery aneurysm.  The maximum diameter of her abdominal aorta is 2.3 cm She  denies any issues with abdominal or chest pain.  She does have issues with chronic back pain due to disc issues.  Given that the patient's duplex study was negative, she does not need repeat imaging She can follow-up with our office as needed   Loel Dubonnet PA-C [facility] Office: [phone]  Clinic MD: Karin Lieu

## 2023-09-28 NOTE — Progress Notes (Signed)
Office Visit Note  Patient: Tiffany Moran             Date of Birth: Mar 31, 1956           MRN: [id number]             PCP: Pincus Sanes, MD Referring: Pincus Sanes, MD Visit Date: 10/12/2023 Occupation: @GUAROCC @  Subjective:  Pain in multiple joints  History of Present Illness: Tiffany Moran is a 67 y.o. female with Raynauds and inflammatory arthritis.  She states for the last 6 months she has been having off-and-on discomfort and swelling in her right hand.  She has also had few episodes of swelling in the left foot.  She was having some discomfort in her cervical spine and lumbar spine.  She was seen by Dr. Darrelyn Hillock for hand discomfort and had x-rays which were unremarkable per patient.  She also had x-rays of her foot which were unremarkable.  She was given Celebrex by Dr. Darrelyn Hillock which she took for few days and then stopped due to GI side effects.  She has some discomfort in her left index finger besides that none of the other joints are painful today.  Raynauds is tolerable currently as the weather is not as cold.  She continues to have dry mouth and dry eyes.    Activities of Daily Living:  Patient reports morning stiffness for a few minutes.   Patient Denies nocturnal pain.  Difficulty dressing/grooming: Denies Difficulty climbing stairs: Denies Difficulty getting out of chair: Denies Difficulty using hands for taps, buttons, cutlery, and/or writing: Denies  Review of Systems  Constitutional:  Positive for fatigue.  HENT:  Positive for mouth dryness. Negative for mouth sores.   Eyes:  Positive for dryness.  Respiratory:  Negative for shortness of breath.   Cardiovascular:  Positive for palpitations. Negative for chest pain.  Gastrointestinal:  Negative for blood in stool, constipation and diarrhea.  Endocrine: Negative for increased urination.  Genitourinary:  Negative for involuntary urination.  Musculoskeletal:  Positive for joint pain, joint pain, joint swelling and  morning stiffness. Negative for gait problem, myalgias, muscle weakness, muscle tenderness and myalgias.  Skin:  Negative for color change, rash, hair loss and sensitivity to sunlight.  Allergic/Immunologic: Negative for susceptible to infections.  Neurological:  Negative for dizziness and headaches.  Hematological:  Negative for swollen glands.  Psychiatric/Behavioral:  Negative for depressed mood and sleep disturbance. The patient is not nervous/anxious.     PMFS History:  Patient Active Problem List   Diagnosis Date Noted   Constipation 03/23/2023   Hyperglycemia 03/09/2021   Osteoarthritis of right knee 03/08/2020   Hypertension 01/22/2019   Chronic sinusitis 09/11/2018   Arthritis of hand, degenerative 04/17/2018   Multiple thyroid nodules-stable, no follow-up needed 12/03/2017   Anxiety-situational 09/26/2017   Liver hemangioma 12/05/2016   Renal cyst, right 12/05/2016   Retinal hole of right eye 02/09/2016   Pes anserine bursitis 04/21/2014   SI (sacroiliac) joint dysfunction 04/21/2014   Osteopenia 08/04/2011   Vitamin D deficiency 08/04/2011   History of mitral valve prolapse 07/26/2010   PAROXYSMAL ATRIAL TACHYCARDIA 01/05/2010   Raynaud's syndrome 01/05/2010   Hyperlipidemia 08/17/2009   POLYARTHRITIS 08/17/2009    Past Medical History:  Diagnosis Date   Cardiospasm 1983   felt may be related to MVP   Hyperlipidemia    Hypertension    Kidney cysts 11/2015   cyst on one of her kidneys, no treatment required   Mitral valve prolapse  PONV (postoperative nausea and vomiting)    teeth chipped   Raynaud disease    Retinal hole of right eye    Shingles 2012   leg   Tachycardia     Family History  Problem Relation Age of Onset   Heart attack Father 42   Myasthenia gravis Father    Hypertension Father    Hyperlipidemia Father    Osteoporosis Father        due to steroids for Myasthenia Grvis   Leukemia Mother 48   Hyperlipidemia Mother    Hypertension  Mother    Hypothyroidism Mother    Prostate cancer Brother        in 42s   Hyperlipidemia Brother    Stroke Maternal Grandfather        in 28s   Healthy Son    Healthy Daughter    Diabetes Neg Hx    Past Surgical History:  Procedure Laterality Date   COLONOSCOPY  2007   [place], [place] ,[place]   DILATATION & CURETTAGE/HYSTEROSCOPY WITH MYOSURE N/A 08/29/2016   Procedure: DILATATION & CURETTAGE/HYSTEROSCOPY;  Surgeon: Romualdo Bolk, MD;  Location: [facility];  Service: Gynecology;  Laterality: N/A;   DILATION AND CURETTAGE OF UTERUS     EXCISION OF SKIN TAG Left 08/29/2016   Procedure: EXCISION OF SKIN TAG left labia majora;  Surgeon: Romualdo Bolk, MD;  Location: [facility];  Service: Gynecology;  Laterality: Left;   EXPLORATORY LAPAROTOMY     X2 for pain: fibroid, enlarged oviduct   RETINAL LASER PROCEDURE Right 08/2015   SALPINGECTOMY  2007 or 2008   and fibroid removed   TUBAL LIGATION     Social History   Social History Narrative   Exercises regularly   Are you right handed or left handed? Right   Are you currently employed ? 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 husband   What type of home do you live in: 1 story or 2 story? two    Caffeine one a day   Immunization History  Administered Date(s) Administered   PFIZER(Purple Top)SARS-COV-2 Vaccination 01/23/2020, 02/13/2020   Tdap 04/03/2011, 05/15/2020     Objective: Vital Signs: BP 134/80 (BP Location: Left Arm, Patient Position: Sitting, Cuff Size: Normal)   Pulse 63   Resp 14   Ht 5\' 6"  (1.676 m)   Wt 145 lb 6.4 oz (66 kg)   LMP 06/17/2009 (Approximate)   BMI 23.47 kg/m    Physical Exam Vitals and nursing note reviewed.  Constitutional:      Appearance: She is well-developed.  HENT:     Head: Normocephalic and atraumatic.  Eyes:     Conjunctiva/sclera: Conjunctivae normal.  Cardiovascular:     Rate and Rhythm: Normal rate and  regular rhythm.     Heart sounds: Normal heart sounds.  Pulmonary:     Effort: Pulmonary effort is normal.     Breath sounds: Normal breath sounds.  Abdominal:     General: Bowel sounds are normal.     Palpations: Abdomen is soft.  Musculoskeletal:     Cervical back: Normal range of motion.  Lymphadenopathy:     Cervical: No cervical adenopathy.  Skin:    General: Skin is warm and dry.     Capillary Refill: Capillary refill takes less than 2 seconds.     Comments: Decreased capillary refill with no sclerodactyly or nailbed capillary  changes was noted.  No telangiectasia were noted.  Neurological:     Mental Status: She is alert and oriented to person, place, and time.  Psychiatric:        Behavior: Behavior normal.      Musculoskeletal Exam: Cervical, thoracic and lumbar spine were in good range of motion.  Shoulder joints, elbow joints, wrist joints, MCPs PIPs and DIPs been good range of motion.  She had bilateral PIP and DIP thickening with no synovitis.  Hip joints and knee joints were in good range of motion without any warmth swelling or effusion.  She had no synovitis in her MCPs PIPs or DIPs.  CDAI Exam: CDAI Score: -- Patient Global: --; Provider Global: -- Swollen: --; Tender: -- Joint Exam 10/12/2023   No joint exam has been documented for this visit   There is currently no information documented on the homunculus. Go to the Rheumatology activity and complete the homunculus joint exam.  Investigation: No additional findings.  Imaging: No results found.  Recent Labs: Lab Results  Component Value Date   WBC 3.9 (L) 03/23/2023   HGB 14.6 03/23/2023   PLT 155.0 03/23/2023   NA 141 03/23/2023   K 4.2 03/23/2023   CL 104 03/23/2023   CO2 30 03/23/2023   GLUCOSE 89 03/23/2023   BUN 12 03/23/2023   CREATININE 0.79 03/23/2023   BILITOT 0.8 03/23/2023   ALKPHOS 92 03/23/2023   AST 33 03/23/2023   ALT 49 (H) 03/23/2023   PROT 7.3 03/23/2023   ALBUMIN 4.8  03/23/2023   CALCIUM 9.8 03/23/2023   GFRAA 84 11/05/2020    Speciality Comments: No specialty comments available.  Procedures:  No procedures performed Allergies: Lipitor [atorvastatin]   Assessment / Plan:     Visit Diagnoses: Raynaud's disease without gangrene - History of severe Raynauds in hands and feet.  She has decreased capillary refill with no nailbed capillary changes.  No digital ulcers were noted.  She had an extensive autoimmune workup in April 2024 which was negative.  All labs were reviewed with the patient.  Keeping core temperature warm and warm clothing was discussed.  Patient she is on Cardizem CD which helps.  But she is also on metoprolol for SVT which makes Raynauds worse.  Dry eyes-she gives history of dry eyes which persists.  Over-the-counter products were discussed.  Primary osteoarthritis of both hands -patient gives history of frequent inflammation in her hands and her feet.  No inflammation was noted on the examination today.  All autoimmune workup in April 2024 was negative which was reviewed with the patient.  Previous x-rays in April 2024 showed some extra-articular calcification around her PIP joints which raises concern of pseudogout.  I discussed possibility of pseudogout with the patient.  Her magnesium level was normal in the past.  Use of colchicine was discussed.  Patient declined colchicine.  She would like to take Advil on a as needed basis.  She tried Celebrex in the past which caused GI side effects.  A list of natural anti-inflammatories was given.  I also advised her to take pictures of her hands and feet if she gets inflammation.  SI (sacroiliac) joint dysfunction-she has intermittent discomfort.  She has no history of psoriasis.  There is no history of plantar fasciitis or Achilles tendinitis.  Primary osteoarthritis of right knee-patient is followed by Dr. Darrelyn Hillock.  I do not have any x-rays available.  Spondylosis of lumbar spine-she continues to  have chronic discomfort.  She is  followed by Dr. Darrelyn Hillock.  Age-related osteoporosis without current pathological fracture-August 02, 2023 DEXA scan showed T-score of -2.5, BMD 0.573 in the left femoral neck.  No comparison was available.  Her previous lowest T-score was -2.4.  She is followed by Dr. Hyacinth Meeker.  She has been taking calcium and vitamin D.  SVT (supraventricular tachycardia) (HCC)-she is metoprolol.  Which is needed for SVT but it makes her Raynauds worse.  Dyslipidemia-she is on Zetia and Crestor.  History of mitral valve prolapse  Multiple thyroid nodules-stable, no follow-up needed  Anxiety  Other chronic sinusitis  Hyperglycemia  Orders: No orders of the defined types were placed in this encounter.  No orders of the defined types were placed in this encounter.   Face-to-face time spent with patient was 30 minutes. Greater than 50% of time was spent in counseling and coordination of care.  Follow-Up Instructions: Return in about 6 months (around 04/11/2024) for Raynauds.   Pollyann Savoy, MD  Note - This record has been created using Animal nutritionist.  Chart creation errors have been sought, but may not always  have been located. Such creation errors do not reflect on  the standard of medical care.

## 2023-10-01 ENCOUNTER — Encounter: Payer: Self-pay | Admitting: Cardiovascular Disease

## 2023-10-01 DIAGNOSIS — R748 Abnormal levels of other serum enzymes: Secondary | ICD-10-CM

## 2023-10-08 ENCOUNTER — Ambulatory Visit (HOSPITAL_BASED_OUTPATIENT_CLINIC_OR_DEPARTMENT_OTHER): Payer: Medicare Other | Admitting: Obstetrics & Gynecology

## 2023-10-12 ENCOUNTER — Encounter: Payer: Self-pay | Admitting: Rheumatology

## 2023-10-12 ENCOUNTER — Ambulatory Visit: Payer: Medicare Other | Attending: Rheumatology | Admitting: Rheumatology

## 2023-10-12 VITALS — BP 134/80 | HR 63 | Resp 14 | Ht 66.0 in | Wt 145.4 lb

## 2023-10-12 DIAGNOSIS — M1711 Unilateral primary osteoarthritis, right knee: Secondary | ICD-10-CM

## 2023-10-12 DIAGNOSIS — E042 Nontoxic multinodular goiter: Secondary | ICD-10-CM | POA: Diagnosis present

## 2023-10-12 DIAGNOSIS — M8589 Other specified disorders of bone density and structure, multiple sites: Secondary | ICD-10-CM

## 2023-10-12 DIAGNOSIS — F419 Anxiety disorder, unspecified: Secondary | ICD-10-CM | POA: Diagnosis present

## 2023-10-12 DIAGNOSIS — R739 Hyperglycemia, unspecified: Secondary | ICD-10-CM | POA: Diagnosis present

## 2023-10-12 DIAGNOSIS — I7300 Raynaud's syndrome without gangrene: Secondary | ICD-10-CM

## 2023-10-12 DIAGNOSIS — M19042 Primary osteoarthritis, left hand: Secondary | ICD-10-CM

## 2023-10-12 DIAGNOSIS — M810 Age-related osteoporosis without current pathological fracture: Secondary | ICD-10-CM | POA: Diagnosis present

## 2023-10-12 DIAGNOSIS — M533 Sacrococcygeal disorders, not elsewhere classified: Secondary | ICD-10-CM

## 2023-10-12 DIAGNOSIS — M19041 Primary osteoarthritis, right hand: Secondary | ICD-10-CM | POA: Diagnosis present

## 2023-10-12 DIAGNOSIS — Z8679 Personal history of other diseases of the circulatory system: Secondary | ICD-10-CM | POA: Diagnosis present

## 2023-10-12 DIAGNOSIS — I471 Supraventricular tachycardia, unspecified: Secondary | ICD-10-CM

## 2023-10-12 DIAGNOSIS — M47816 Spondylosis without myelopathy or radiculopathy, lumbar region: Secondary | ICD-10-CM

## 2023-10-12 DIAGNOSIS — H04123 Dry eye syndrome of bilateral lacrimal glands: Secondary | ICD-10-CM

## 2023-10-12 DIAGNOSIS — E785 Hyperlipidemia, unspecified: Secondary | ICD-10-CM | POA: Diagnosis present

## 2023-10-12 DIAGNOSIS — J328 Other chronic sinusitis: Secondary | ICD-10-CM | POA: Diagnosis present

## 2023-10-24 ENCOUNTER — Other Ambulatory Visit (HOSPITAL_COMMUNITY)
Admission: RE | Admit: 2023-10-24 | Discharge: 2023-10-24 | Disposition: A | Discharge status: Home / Self Care | Payer: Medicare Other | Source: Ambulatory Visit | Attending: Obstetrics & Gynecology | Admitting: Obstetrics & Gynecology

## 2023-10-24 ENCOUNTER — Encounter (HOSPITAL_BASED_OUTPATIENT_CLINIC_OR_DEPARTMENT_OTHER): Payer: Self-pay | Admitting: Obstetrics & Gynecology

## 2023-10-24 ENCOUNTER — Ambulatory Visit (HOSPITAL_BASED_OUTPATIENT_CLINIC_OR_DEPARTMENT_OTHER): Payer: Medicare Other | Admitting: Obstetrics & Gynecology

## 2023-10-24 VITALS — BP 126/63 | HR 61 | Ht 65.5 in | Wt 145.4 lb

## 2023-10-24 DIAGNOSIS — Z01419 Encounter for gynecological examination (general) (routine) without abnormal findings: Secondary | ICD-10-CM

## 2023-10-24 DIAGNOSIS — M810 Age-related osteoporosis without current pathological fracture: Secondary | ICD-10-CM | POA: Diagnosis not present

## 2023-10-24 DIAGNOSIS — N368 Other specified disorders of urethra: Secondary | ICD-10-CM

## 2023-10-24 DIAGNOSIS — Z124 Encounter for screening for malignant neoplasm of cervix: Secondary | ICD-10-CM | POA: Insufficient documentation

## 2023-10-24 DIAGNOSIS — R300 Dysuria: Secondary | ICD-10-CM

## 2023-10-24 LAB — POCT URINALYSIS DIPSTICK
Bilirubin, UA: NEGATIVE
Blood, UA: NEGATIVE
Glucose, UA: NEGATIVE
Ketones, UA: NEGATIVE
Leukocytes, UA: NEGATIVE
Nitrite, UA: NEGATIVE
Protein, UA: NEGATIVE
Spec Grav, UA: 1.01 (ref 1.010–1.025)
Urobilinogen, UA: 0.2 U/dL
pH, UA: 7 (ref 5.0–8.0)

## 2023-10-24 MED ORDER — ESTRADIOL 0.1 MG/GM VA CREA
TOPICAL_CREAM | VAGINAL | 6 refills | Status: DC
Start: 2023-10-24 — End: 2024-03-10

## 2023-10-24 NOTE — Progress Notes (Signed)
67 y.o. G39P2002 Married White or Caucasian female here for breast and pelvic exam.   Denies vaginal bleeding.  Has noticed some increased urinary urgency.  Doesn't leak.  No blood.  Not sure she wants any treatment but just wanted to make me aware.  Since I saw her last, she has been diagnosed with venous insufficiency with some skin changes in the skin on her legs.    She has noticed a little change in her left lip, droop.  MRI of the brain was normal.  PT of neck muscles offered/recommended.  She declined this.    Expecting new grandchild and is wondering about local classes.  Grandparent classes through [email or web address] discussed today.  Information given.    Patient's last menstrual period was 06/17/2009 (approximate).           Health Maintenance: PCP:  Dr. Lawerance Bach.  Last wellness appt was 02/2026.  Did blood work at that appt:  yes Vaccines are up to date:  reviewed today.  Has not done shingles. Colonoscopy:  12/10/2015 MMG:  08/02/2023 Negative BMD:  08/02/2023 beginning of osteoporosis Last pap smear:  09/08/2021 Negative.      reports that she has never smoked. She has never been exposed to tobacco smoke. She has never used smokeless tobacco. She reports that she does not drink alcohol and does not use drugs.  Past Medical History:  Diagnosis Date   Cardiospasm 1983   felt may be related to MVP   Hyperlipidemia    Hypertension    Kidney cysts 11/2015   cyst on one of her kidneys, no treatment required   Mitral valve prolapse    PONV (postoperative nausea and vomiting)    teeth chipped   Raynaud disease    Retinal hole of right eye    Shingles 2012   leg   Tachycardia     Past Surgical History:  Procedure Laterality Date   [place]ONOSCOPY  2007   [place], [place] ,[place]   DILATATION & CURETTAGE/HYSTEROSCOPY WITH MYOSURE N/A 08/29/2016   Procedure: DILATATION & CURETTAGE/HYSTEROSCOPY;  Surgeon: Romualdo Bolk, MD;  Location: [facility];  Service: Gynecology;   Laterality: N/A;   DILATION AND CURETTAGE OF UTERUS     EXCISION OF SKIN TAG Left 08/29/2016   Procedure: EXCISION OF SKIN TAG left labia majora;  Surgeon: Romualdo Bolk, MD;  Location: [facility];  Service: Gynecology;  Laterality: Left;   EXPLORATORY LAPAROTOMY     X2 for pain: fibroid, enlarged oviduct   RETINAL LASER PROCEDURE Right 08/2015   SALPINGECTOMY  2007 or 2008   and fibroid removed   TUBAL LIGATION      Current Outpatient Medications  Medication Sig Dispense Refill   ALPRAZolam (XANAX) 0.5 MG tablet TAKE 1 TABLET BY MOUTH EVERY 8 HOURS AS NEEDED FOR ANXIETY 20 tablet 0   Cholecalciferol (VITAMIN D) 2000 units CAPS Take 2,000 Units by mouth daily.     diltiazem (CARDIZEM CD) 120 MG 24 hr capsule Take 1 capsule (120 mg total) by mouth daily. 90 capsule 3   ezetimibe (ZETIA) 10 MG tablet Take 1/2 (one-half) tablet by mouth once daily 45 tablet 0   Loteprednol Etabonate (LOTEMAX OP) Apply to eye as needed.     metoprolol succinate (TOPROL-XL) 25 MG 24 hr tablet Take 1 tablet (25 mg total) by mouth 2 (two) times daily.     rosuvastatin (CRESTOR) 20 MG tablet Take 1 tablet by mouth once daily 90 tablet 3   No  current facility-administered medications for this visit.    Family History  Problem Relation Age of Onset   Heart attack Father 24   Myasthenia gravis Father    Hypertension Father    Hyperlipidemia Father    Osteoporosis Father        due to steroids for Myasthenia Grvis   Leukemia Mother 53   Hyperlipidemia Mother    Hypertension Mother    Hypothyroidism Mother    Prostate cancer Brother        in 47s   Hyperlipidemia Brother    Stroke Maternal Grandfather        in 50s   Healthy Son    Healthy Daughter    Diabetes Neg Hx     Review of Systems  Constitutional: Negative.   Genitourinary:  Positive for urgency.    Exam:   BP 126/63 (BP Location: Right Arm, Patient Position: Sitting, Cuff Size: Large)   Pulse 61   Ht 5' 5.5" (1.664 m)   Wt 145 lb  6.4 oz (66 kg)   LMP 06/17/2009 (Approximate)   BMI 23.83 kg/m   Height: 5' 5.5" (166.4 cm)  General appearance: alert, cooperative and appears stated age Breasts: normal appearance, no masses or tenderness Abdomen: soft, non-tender; bowel sounds normal; no masses,  no organomegaly Lymph nodes: Cervical, supraclavicular, and axillary nodes normal.  No abnormal inguinal nodes palpated Neurologic: Grossly normal  Pelvic: External genitalia:  no lesions              Urethra:  urethral prolapse noted today, no mass, tenderness or lesions              Bartholins and Skenes: normal                 Vagina: normal appearing vagina with atrophic changes and no discharge, no lesions              Cervix: no lesions              Pap taken: Yes.   Bimanual Exam:  Uterus:  normal size, contour, position, consistency, mobility, non-tender              Adnexa: normal adnexa and no mass, fullness, tenderness               Rectovaginal: Confirms               Anus:  normal sphincter tone, no lesions  Chaperone, Ina Homes, CMA, was present for exam.  Assessment/Plan: 1. Encntr for gyn exam (general) (routine) w/o abn findings - Pap smear obtained today per pt request.  This was negative 09/08/2021 - Mammogram 08/02/2023 - Colonoscopy 12/10/2015, follow up 10 years - Bone mineral density 08/02/2023 - lab work done with PCP, Dr. Lawerance Bach - vaccines reviewed/updated  2. Dysuria - will r/o UTI - POCT Urinalysis Dipstick - Urine Culture  3. Cervical cancer screening - Cytology - PAP( [place]) - PR OBTAINING SCREEN PAP SMEAR  4. Age-related osteoporosis without current pathological fracture - T score -2.5 with imaging this year.  Declines treatment at this time.  Willing to repeat BMD in 2-3 years.   - calcium dosage and Vit D recommendations discussed.  5. Urethral prolapse - findings discussed with pt and possible relationship to urinary urgency.  Will treat with vaginal estrogen cream -  estradiol (ESTRACE) 0.1 MG/GM vaginal cream; Apply small amount of cream to urethra two to three times weekly.  Dispense: 42.5 g; Refill: 6

## 2023-10-25 LAB — HEPATIC FUNCTION PANEL
ALT: 33 [IU]/L — ABNORMAL HIGH (ref 0–32)
AST: 29 [IU]/L (ref 0–40)
Albumin: 4.7 g/dL (ref 3.9–4.9)
Alkaline Phosphatase: 114 [IU]/L (ref 44–121)
Bilirubin Total: 0.5 mg/dL (ref 0.0–1.2)
Bilirubin, Direct: 0.18 mg/dL (ref 0.00–0.40)
Total Protein: 6.9 g/dL (ref 6.0–8.5)

## 2023-10-26 LAB — CYTOLOGY - PAP: Diagnosis: NEGATIVE

## 2023-10-27 DIAGNOSIS — N368 Other specified disorders of urethra: Secondary | ICD-10-CM | POA: Insufficient documentation

## 2023-10-27 DIAGNOSIS — R300 Dysuria: Secondary | ICD-10-CM | POA: Insufficient documentation

## 2023-11-02 ENCOUNTER — Other Ambulatory Visit: Payer: Self-pay | Admitting: Internal Medicine

## 2023-11-07 ENCOUNTER — Other Ambulatory Visit: Payer: Self-pay | Admitting: Internal Medicine

## 2023-11-12 ENCOUNTER — Encounter: Payer: Self-pay | Admitting: Cardiovascular Disease

## 2023-11-12 ENCOUNTER — Telehealth: Payer: Self-pay | Admitting: Cardiovascular Disease

## 2023-11-12 NOTE — Telephone Encounter (Signed)
*  STAT* If patient is at the pharmacy, call can be transferred to refill team.   1. Which medications need to be refilled? (please list name of each medication and dose if known)   ezetimibe (ZETIA) 10 MG tablet     2. Would you like to learn more about the convenience, safety, & potential cost savings by using the [facility]?  No   3. Are you open to using the [facility] (Type [facility]. ) No   4. Which pharmacy/location (including street and city if local pharmacy) is medication to be sent to? [facility] [address], [place] - [address]    5. Do they need a 30 day or 90 day supply? 90 day   Pt states that all medications need to be under Dr. Allyson Sabal now being that she no longer sees Dr. Tenny Craw

## 2023-11-13 MED ORDER — EZETIMIBE 10 MG PO TABS: 5.0000 mg | ORAL_TABLET | Freq: Every day | ORAL | 1 refills | Status: DC

## 2023-11-19 ENCOUNTER — Other Ambulatory Visit: Payer: Self-pay

## 2023-11-19 DIAGNOSIS — R002 Palpitations: Secondary | ICD-10-CM

## 2023-11-19 MED ORDER — METOPROLOL SUCCINATE ER 25 MG PO TB24
25.0000 mg | ORAL_TABLET | Freq: Two times a day (BID) | ORAL | 0 refills | Status: DC
Start: 2023-11-19 — End: 2024-02-22

## 2023-11-19 MED ORDER — ROSUVASTATIN CALCIUM 20 MG PO TABS: 20.0000 mg | ORAL_TABLET | Freq: Every day | ORAL | 0 refills | Status: DC

## 2023-11-19 MED ORDER — DILTIAZEM HCL ER COATED BEADS 120 MG PO CP24: 120.0000 mg | ORAL_CAPSULE | Freq: Every day | ORAL | 0 refills | Status: DC

## 2024-02-13 ENCOUNTER — Encounter: Payer: Self-pay | Admitting: Cardiovascular Disease

## 2024-02-18 ENCOUNTER — Other Ambulatory Visit: Payer: Self-pay | Admitting: Internal Medicine

## 2024-02-18 NOTE — Telephone Encounter (Signed)
 Called and spoke to pt regarding the prn metoprolol dose.  She states she took an extra dose at noon and did not feel great afterwards.  She does feel anxious about the upcoming cataract surgery, that is scheduled in about 2 weeks.  She is asking if it is okay to take the prn metoprolol dose in the morning with her regular dose, since she is on Diltiazem 120  mg.  I advised her that would be okay to do as long as her BP was not too low, but she stated she feels like BP has been high also.  All of this began 4-6 weeks ago.  She has OV w/Dr. Allyson Sabal on 03/10/2024; advised her to bring BP log and to keep track of how many times she has to take a prn dose of Metoprolol.

## 2024-02-20 ENCOUNTER — Other Ambulatory Visit: Payer: Self-pay | Admitting: Internal Medicine

## 2024-02-20 DIAGNOSIS — R002 Palpitations: Secondary | ICD-10-CM

## 2024-02-27 ENCOUNTER — Other Ambulatory Visit: Payer: Self-pay

## 2024-02-27 MED ORDER — DILTIAZEM HCL ER COATED BEADS 120 MG PO CP24: 120.0000 mg | ORAL_CAPSULE | Freq: Every day | ORAL | 0 refills | Status: DC

## 2024-02-29 ENCOUNTER — Telehealth (HOSPITAL_BASED_OUTPATIENT_CLINIC_OR_DEPARTMENT_OTHER): Payer: Self-pay | Admitting: *Deleted

## 2024-02-29 NOTE — Telephone Encounter (Signed)
 Pt called with reports of right lower quadrant pressure that she has felt for the last 2 weeks. She feels it when she is bending over or when she brings her knees up. She denies pain, fever, is having regular bowel movements. Pt is unsure if this is a muscular issue or an issue with her ovary. She would like to come in for an appt to rule out problem with ovary. Pt provided with appt.

## 2024-03-03 ENCOUNTER — Encounter (HOSPITAL_BASED_OUTPATIENT_CLINIC_OR_DEPARTMENT_OTHER): Payer: Self-pay | Admitting: Certified Nurse Midwife

## 2024-03-03 ENCOUNTER — Ambulatory Visit (INDEPENDENT_AMBULATORY_CARE_PROVIDER_SITE_OTHER): Admitting: Certified Nurse Midwife

## 2024-03-03 ENCOUNTER — Ambulatory Visit (HOSPITAL_BASED_OUTPATIENT_CLINIC_OR_DEPARTMENT_OTHER)

## 2024-03-03 ENCOUNTER — Ambulatory Visit: Payer: Medicare Other | Admitting: Cardiovascular Disease

## 2024-03-03 VITALS — BP 130/60 | HR 67 | Ht 65.5 in | Wt 142.2 lb

## 2024-03-03 DIAGNOSIS — R1084 Generalized abdominal pain: Secondary | ICD-10-CM | POA: Diagnosis not present

## 2024-03-03 DIAGNOSIS — R102 Pelvic and perineal pain unspecified side: Secondary | ICD-10-CM

## 2024-03-03 DIAGNOSIS — R1903 Right lower quadrant abdominal swelling, mass and lump: Secondary | ICD-10-CM | POA: Diagnosis not present

## 2024-03-03 DIAGNOSIS — R1031 Right lower quadrant pain: Secondary | ICD-10-CM | POA: Diagnosis not present

## 2024-03-03 LAB — CBC WITH DIFFERENTIAL/PLATELET
Basophils Absolute: 0 10*3/uL (ref 0.0–0.2)
Basos: 0 %
EOS (ABSOLUTE): 0.1 10*3/uL (ref 0.0–0.4)
Eos: 2 %
Hematocrit: 42.4 % (ref 34.0–46.6)
Hemoglobin: 14.3 g/dL (ref 11.1–15.9)
Lymphocytes Absolute: 1.1 10*3/uL (ref 0.7–3.1)
Lymphs: 23 %
MCH: 30.6 pg (ref 26.6–33.0)
MCHC: 33.7 g/dL (ref 31.5–35.7)
MCV: 91 fL (ref 79–97)
Monocytes Absolute: 0.4 10*3/uL (ref 0.1–0.9)
Monocytes: 8 %
Neutrophils Absolute: 3.3 10*3/uL (ref 1.4–7.0)
Neutrophils: 67 %
Platelets: 170 10*3/uL (ref 150–450)
RBC: 4.68 x10E6/uL (ref 3.77–5.28)
RDW: 13.7 % (ref 11.7–15.4)
WBC: 5 10*3/uL (ref 3.4–10.8)

## 2024-03-03 NOTE — Progress Notes (Signed)
  Subjective:    Tiffany Moran is a 68yo postmenopausal G2P2 here for problem gyn visit. She called on 02/29/24.  Nurse Note-Telephone Pt called with reports of right lower quadrant pressure that she has felt for the last 2 weeks. She feels it when she is bending over or when she brings her knees up. She denies pain, fever, is having regular bowel movements. Pt is unsure if this is a muscular issue or an issue with her ovary. She would like to come in for an appt to rule out problem with ovary. Pt provided with appt     Patient reports that approx 2 weeks ago she started noticing a "pressure" in right lower quadrant with certain movements. With bending over or bringing knees up, she feels like there is something causing "pressure" or something present in RLQ (points to right above hairline to right of midline).  The pressure may be a little more noticeable today than it was 2 weeks ago. She was told at some point in the past (few years ago) that she had kidney stone. Pt unsure if kidney stone passed.   She denies fever, chills, nausea, vomiting, constipation or diarrhea. Denies urinary frequency, hesitancy, urgency, hematuria. Denies vaginal complaints.  Denies vaginal spotting or bleeding. Pt states bowel movements seem normal.  Patient is prepping for cataract surgery tomorrow.     The following portions of the patient's history were reviewed and updated as appropriate: allergies, current medications, past family history, past medical history, past social history, past surgical history, and problem list.   Review of Systems Pertinent items are noted in HPI.  No fever, chills, nausea, vomiting, constipation, diarrhea, urinary complaints or vaginal complaints.   Objective:    BP 130/60 (BP Location: Left Arm, Patient Position: Sitting, Cuff Size: Normal)   Pulse 67   Ht 5' 5.5" (1.664 m)   Wt 142 lb 3.2 oz (64.5 kg)   LMP 06/17/2009 (Approximate)   BMI 23.30 kg/m  General appearance: alert and  cooperative Abdomen: normal findings: soft, non-tender and umbilicus normal Pelvic: external genitalia normal, no adnexal masses or tenderness, no cervical motion tenderness, rectovaginal septum normal, uterus normal size, shape, and consistency, vagina normal without discharge, and erythema present at urethra (pt states this was noted previously and she has estrogen cream to apply)    UA: Normal except Trace-Intact Blood  Assessment:    Acute Pelvic Pain, Right .  Hematuria (trace)   Plan:   Patient would prefer GYN and Abdominal US today if possible. Next available appointment 5:30pm [facility]. CBC UA, Urine Culture Will follow-up with patient once [place] results available.  Letta Kocher

## 2024-03-04 HISTORY — PX: CATARACT EXTRACTION: SUR2

## 2024-03-04 LAB — URINE CULTURE: Organism ID, Bacteria: NO GROWTH

## 2024-03-05 ENCOUNTER — Ambulatory Visit (INDEPENDENT_AMBULATORY_CARE_PROVIDER_SITE_OTHER)

## 2024-03-05 ENCOUNTER — Ambulatory Visit (INDEPENDENT_AMBULATORY_CARE_PROVIDER_SITE_OTHER): Admitting: Obstetrics & Gynecology

## 2024-03-05 ENCOUNTER — Encounter (HOSPITAL_BASED_OUTPATIENT_CLINIC_OR_DEPARTMENT_OTHER): Payer: Self-pay | Admitting: Obstetrics & Gynecology

## 2024-03-05 VITALS — BP 137/74 | HR 70 | Wt 148.6 lb

## 2024-03-05 DIAGNOSIS — R1031 Right lower quadrant pain: Secondary | ICD-10-CM

## 2024-03-05 DIAGNOSIS — R102 Pelvic and perineal pain: Secondary | ICD-10-CM

## 2024-03-05 NOTE — Progress Notes (Signed)
 GYNECOLOGY  VISIT  CC:   RLQ pain  HPI: 68 y.o. [id number] Married White or Caucasian female here for complaint of RLQ pain that has been present for about two weeks.  It is present with squatting or leaning over, sometimes with walking.  No vaginal bleeding.  Felt at first it must be bowel so has been able to have some good bowel movements and this didn't resolve.  Was seen by Merrilee Jansky, CNM, with essentially normal pelvic exam.  Pelvic ultrasound recommended.  CBC with diff was normal.  Pap was normal 10/24/2023.    Ultrasound today does not show any cause for pain.  Ovaries are normal.  Uterus atrophic.  No masses noted.  No free fluid noted.     Past Medical History:  Diagnosis Date   Cardiospasm 1983   felt may be related to MVP   Hyperlipidemia    Hypertension    Kidney cysts 11/2015   cyst on one of her kidneys, no treatment required   Mitral valve prolapse    PONV (postoperative nausea and vomiting)    teeth chipped   Raynaud disease    Retinal hole of right eye    Shingles 2012   leg   Tachycardia     MEDS:   Current Outpatient Medications on File Prior to Visit  Medication Sig Dispense Refill   ALPRAZolam (XANAX) 0.5 MG tablet TAKE 1 TABLET BY MOUTH EVERY 8 HOURS AS NEEDED FOR ANXIETY 20 tablet 1   Cholecalciferol (VITAMIN D) 2000 units CAPS Take 2,000 Units by mouth daily.     diltiazem (CARDIZEM CD) 120 MG 24 hr capsule Take 1 capsule (120 mg total) by mouth daily. 90 capsule 0   estradiol (ESTRACE) 0.1 MG/GM vaginal cream Apply small amount of cream to urethra two to three times weekly. 42.5 g 6   ezetimibe (ZETIA) 10 MG tablet Take 0.5 tablets (5 mg total) by mouth daily. 45 tablet 1   Loteprednol Etabonate (LOTEMAX OP) Apply to eye as needed.     metoprolol succinate (TOPROL-XL) 25 MG 24 hr tablet TAKE 1 TABLET BY MOUTH THREE TIMES DAILY 270 tablet 0   rosuvastatin (CRESTOR) 20 MG tablet Take 1 tablet (20 mg total) by mouth daily. 90 tablet 0   No current  facility-administered medications on file prior to visit.    ALLERGIES: Lipitor [atorvastatin]  SH:  married, non smoker  Review of Systems  Constitutional: Negative.   Gastrointestinal:  Negative for constipation, diarrhea and nausea.       RLQ pain    PHYSICAL EXAMINATION:    BP 137/74 (BP Location: Left Arm, Patient Position: Sitting, Cuff Size: Large)   Pulse 70   Wt 148 lb 9.6 oz (67.4 kg)   LMP 06/17/2009 (Approximate)   BMI 24.35 kg/m     General appearance: alert, cooperative and appears stated age Abdomen: soft, tender in RLQ in somewhat focal location; bowel sounds normal; no masses,  no organomegaly Lymph:  no inguinal LAD noted   Assessment/Plan: 1. Right lower quadrant abdominal pain (Primary) - given normal ultrasound that does not help with cause of pain, feel CT imaging is warranted.  - will need to check renal function.  BMP ordered.    - CT ABDOMEN PELVIS W CONTRAST; Future - Basic metabolic panel

## 2024-03-06 ENCOUNTER — Encounter (HOSPITAL_BASED_OUTPATIENT_CLINIC_OR_DEPARTMENT_OTHER): Payer: Self-pay | Admitting: Obstetrics & Gynecology

## 2024-03-06 LAB — BASIC METABOLIC PANEL
BUN/Creatinine Ratio: 13 (ref 12–28)
BUN: 11 mg/dL (ref 8–27)
CO2: 24 mmol/L (ref 20–29)
Calcium: 9.8 mg/dL (ref 8.7–10.3)
Chloride: 104 mmol/L (ref 96–106)
Creatinine, Ser: 0.85 mg/dL (ref 0.57–1.00)
Glucose: 94 mg/dL (ref 70–99)
Potassium: 4.2 mmol/L (ref 3.5–5.2)
Sodium: 142 mmol/L (ref 134–144)
eGFR: 75 mL/min/{1.73_m2} (ref >59)

## 2024-03-07 ENCOUNTER — Ambulatory Visit (HOSPITAL_BASED_OUTPATIENT_CLINIC_OR_DEPARTMENT_OTHER)

## 2024-03-07 ENCOUNTER — Ambulatory Visit (HOSPITAL_BASED_OUTPATIENT_CLINIC_OR_DEPARTMENT_OTHER)
Admission: RE | Admit: 2024-03-07 | Discharge: 2024-03-07 | Disposition: A | Discharge status: Home / Self Care | Source: Ambulatory Visit | Attending: Obstetrics & Gynecology | Admitting: Obstetrics & Gynecology

## 2024-03-07 DIAGNOSIS — R1031 Right lower quadrant pain: Secondary | ICD-10-CM | POA: Insufficient documentation

## 2024-03-07 LAB — CBC WITH DIFFERENTIAL/PLATELET

## 2024-03-07 MED ORDER — IOHEXOL 300 MG/ML  SOLN
100.0000 mL | Freq: Once | INTRAMUSCULAR | Status: AC | PRN
  Administered 2024-03-07: 100 mL via INTRAVENOUS

## 2024-03-08 ENCOUNTER — Ambulatory Visit (HOSPITAL_BASED_OUTPATIENT_CLINIC_OR_DEPARTMENT_OTHER)

## 2024-03-10 ENCOUNTER — Telehealth: Payer: Self-pay | Admitting: Cardiovascular Disease

## 2024-03-10 ENCOUNTER — Ambulatory Visit: Payer: Medicare Other | Attending: Cardiovascular Disease | Admitting: Cardiovascular Disease

## 2024-03-10 ENCOUNTER — Encounter: Payer: Self-pay | Admitting: Cardiovascular Disease

## 2024-03-10 VITALS — BP 134/82 | HR 77 | Ht 66.0 in | Wt 145.2 lb

## 2024-03-10 DIAGNOSIS — I10 Essential (primary) hypertension: Secondary | ICD-10-CM | POA: Diagnosis present

## 2024-03-10 DIAGNOSIS — I471 Supraventricular tachycardia, unspecified: Secondary | ICD-10-CM | POA: Diagnosis present

## 2024-03-10 DIAGNOSIS — R931 Abnormal findings on diagnostic imaging of heart and coronary circulation: Secondary | ICD-10-CM | POA: Diagnosis present

## 2024-03-10 DIAGNOSIS — R002 Palpitations: Secondary | ICD-10-CM | POA: Diagnosis not present

## 2024-03-10 DIAGNOSIS — E782 Mixed hyperlipidemia: Secondary | ICD-10-CM | POA: Diagnosis not present

## 2024-03-10 NOTE — Assessment & Plan Note (Signed)
 History of PAT on diltiazem and beta-blocker.  Her last event monitor was performed 11/08/2022 showed several episodes of SVT and occasional PACs and PVCs.  These have improved since limiting her caffeine intake.  They are also somewhat driven by anxiety and stress and occur mostly at night.  She says that the are somewhat affecting her quality of life.  I offered her an evaluation by EP but she wishes to defer at the current time.

## 2024-03-10 NOTE — Telephone Encounter (Signed)
 Runell Gess, MD  You12 minutes ago (1:29 PM)    This represents lead position.  There is no change from prior EKG.    Message sent to patient via MyChart

## 2024-03-10 NOTE — Telephone Encounter (Signed)
 Spoke with patient of Dr. Allyson Sabal  She was seen today   She was concerned there was "septal infarct" on her ECG.  She reports her prior cardiac work-ups have never indicate a MI.  She would like clarification on this.   Per note: EKG EKG Interpretation Date/Time:                  Monday March 10 2024 08:08:41 EDT Ventricular Rate:         77 PR Interval:                 144 QRS Duration:             78 QT Interval:                 400 QTC Calculation:452 R Axis:                         76   Text Interpretation: Normal sinus rhythm Septal infarct , age undetermined No previous ECGs available Confirmed by Nanetta Batty [id number]) on 03/10/2024 8:20:41 AM

## 2024-03-10 NOTE — Patient Instructions (Addendum)
 Medication Instructions:  Your physician recommends that you continue on your current medications as directed. Please refer to the Current Medication list given to you today.  *If you need a refill on your cardiac medications before your next appointment, please call your pharmacy*   Testing/Procedures: Dr. Allyson Sabal has ordered a CT coronary calcium score.   Test locations:  [facility]  [place] [facility] at [facility]  This is $99 out of pocket.   Coronary CalciumScan A coronary calcium scan is an imaging test used to look for deposits of calcium and other fatty materials (plaques) in the inner lining of the blood vessels of the heart (coronary arteries). These deposits of calcium and plaques can partly clog and narrow the coronary arteries without producing any symptoms or warning signs. This puts a person at risk for a heart attack. This test can detect these deposits before symptoms develop. Tell a health care provider about: Any allergies you have. All medicines you are taking, including vitamins, herbs, eye drops, creams, and over-the-counter medicines. Any problems you or family members have had with anesthetic medicines. Any blood disorders you have. Any surgeries you have had. Any medical conditions you have. Whether you are pregnant or may be pregnant. What are the risks? Generally, this is a safe procedure. However, problems may occur, including: Harm to a pregnant woman and her unborn baby. This test involves the use of radiation. Radiation exposure can be dangerous to a pregnant woman and her unborn baby. If you are pregnant, you generally should not have this procedure done. Slight increase in the risk of cancer. This is because of the radiation involved in the test. What happens before the procedure? No preparation is needed for this procedure. What happens during the procedure? You will undress and  remove any jewelry around your neck or chest. You will put on a hospital gown. Sticky electrodes will be placed on your chest. The electrodes will be connected to an electrocardiogram (ECG) machine to record a tracing of the electrical activity of your heart. A CT scanner will take pictures of your heart. During this time, you will be asked to lie still and hold your breath for 2-3 seconds while a picture of your heart is being taken. The procedure may vary among health care providers and hospitals. What happens after the procedure? You can get dressed. You can return to your normal activities. It is up to you to get the results of your test. Ask your health care provider, or the department that is doing the test, when your results will be ready. Summary A coronary calcium scan is an imaging test used to look for deposits of calcium and other fatty materials (plaques) in the inner lining of the blood vessels of the heart (coronary arteries). Generally, this is a safe procedure. Tell your health care provider if you are pregnant or may be pregnant. No preparation is needed for this procedure. A CT scanner will take pictures of your heart. You can return to your normal activities after the scan is done. This information is not intended to replace advice given to you by your health care provider. Make sure you discuss any questions you have with your health care provider. Document Released: 06/01/2008 Document Revised: 10/23/2016 Document Reviewed: 10/23/2016 Elsevier Interactive Patient Education  2017 [employer].    Follow-Up: At [facility], you and your health needs are our priority.  As part of our continuing  mission to provide you with exceptional heart care, we have created designated Provider Care Teams.  These Care Teams include your primary Cardiologist (physician) and Advanced Practice Providers (APPs -  Physician Assistants and Nurse Practitioners) who all work together to  provide you with the care you need, when you need it.  We recommend signing up for the patient portal called "MyChart".  Sign up information is provided on this After Visit Summary.  MyChart is used to connect with patients for Virtual Visits (Telemedicine).  Patients are able to view lab/test results, encounter notes, upcoming appointments, etc.  Non-urgent messages can be sent to your provider as well.   To learn more about what you can do with MyChart, go to [email or web address].    Your next appointment:   12 month(s)  Provider:   Nanetta Batty, MD    Other Instructions   1st Floor: - Lobby - Registration  - Pharmacy  - Lab - Cafe  2nd Floor: - PV Lab - Diagnostic Testing (echo, CT, nuclear med)  3rd Floor: - Vacant  4th Floor: - TCTS (cardiothoracic surgery) - AFib Clinic - Structural Heart Clinic - Vascular Surgery  - Vascular Ultrasound  5th Floor: - [facility] (general and EP) - Clinical Pharmacy for coumadin, hypertension, lipid, weight-loss medications, and med management appointments    Valet parking services will be available as well.

## 2024-03-10 NOTE — Assessment & Plan Note (Signed)
 History of essential hypertension her blood pressure measured today at 134/82.  She is on metoprolol and diltiazem.

## 2024-03-10 NOTE — Assessment & Plan Note (Addendum)
 History of hyperlipidemia on statin therapy and Zetia with lipid profile performed 03/23/23 revealing total cholesterol of 151, LDL of 62 HDL 72.

## 2024-03-10 NOTE — Progress Notes (Signed)
 03/10/2024 Myrtice Lauth   07-20-1956  [id number]  Primary Physician Lawerance Bach, Bobette Mo, MD Primary Cardiologist: Runell Gess MD Nicholes Calamity, [name]  HPI:  Tiffany Moran is a 68 y.o.   thin-appearing married Caucasian female mother of 2 children who is followed by Dr. Dietrich Pates.  She was seeing me for second opinion.  I last saw her in the office 03/06/2023.  She has a history of family history of heart disease, hyperlipidemia on medical therapy, valve prolapse, anxiety, and palpitations.  Her palpitations began after her first child back in the 82s.  She has had episodes of PSVT that have been responsive in the past with carotid massage and adenosine.  Most recently she has been treated with calcium channel blockers and beta-blockers at various doses.  Her most recent event monitor performed in December 23 revealed short runs of SVT with PACs and PVCs.  She does drink 1 cup of coffee a day.  She does admit to having anxiety treated with Xanax.  Since I saw her a year ago she continues to do well.  She did decrease her caffeine intake which resulted in improvement in her palpitations which occur mostly in the evening hours.  She is fairly active and walks.  She denies chest pain or shortness of breath.  She recently had a cataract surgery performed and she has 1 left ago.  She also has a right lower quadrant fullness which is being worked up by her OB/GYN, Dr. Hyacinth Meeker.  Of note she did have a coronary CTA performed 06/01/2015 which was 8 principally in the proximal LAD that was nonobstructive.   Current Meds  Medication Sig   ALPRAZolam (XANAX) 0.5 MG tablet TAKE 1 TABLET BY MOUTH EVERY 8 HOURS AS NEEDED FOR ANXIETY   Cholecalciferol (VITAMIN D) 2000 units CAPS Take 2,000 Units by mouth daily.   diltiazem (CARDIZEM CD) 120 MG 24 hr capsule Take 1 capsule (120 mg total) by mouth daily.   ezetimibe (ZETIA) 10 MG tablet Take 0.5 tablets (5 mg total) by mouth daily.   metoprolol succinate  (TOPROL-XL) 25 MG 24 hr tablet TAKE 1 TABLET BY MOUTH THREE TIMES DAILY (Patient taking differently: Take 25 mg by mouth 2 (two) times daily.)   rosuvastatin (CRESTOR) 20 MG tablet Take 1 tablet (20 mg total) by mouth daily.     Allergies  Allergen Reactions   Lipitor [Atorvastatin] Other (See Comments)    Did not tolerate    Social History   Socioeconomic History   Marital status: Married    Spouse name: Not on file   Number of children: Not on file   Years of education: Not on file   Highest education level: Not on file  Occupational History   Not on file  Tobacco Use   Smoking status: Never    Passive exposure: Never   Smokeless tobacco: Never  Vaping Use   Vaping status: Never Used  Substance and Sexual Activity   Alcohol use: No   Drug use: No   Sexual activity: Yes    Birth control/protection: Other-see comments, Post-menopausal    Comment: BTL  Other Topics Concern   Not on file  Social History Narrative   Exercises regularly   Are you right handed or left handed? Right   Are you currently employed ? 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 husband  What type of home do you live in: 1 story or 2 story? two    Caffeine one a day   Social Drivers of Corporate investment banker Strain: Not on file  Food Insecurity: Not on file  Transportation Needs: Not on file  Physical Activity: Not on file  Stress: Not on file  Social Connections: Unknown (05/19/2023)   Received from [facility]   Social Network    Social Network: Not on file  Intimate Partner Violence: Unknown (05/19/2023)   Received from [facility], [facility]   HITS    Physically Hurt: Not on file    Insult or Talk Down To: Not on file    Threaten Physical Harm: Not on file    Scream or Curse: Not on file     Review of Systems: General: negative for chills, fever, night sweats or weight changes.   Cardiovascular: negative for chest pain, dyspnea on exertion, edema, orthopnea, palpitations, paroxysmal nocturnal dyspnea or shortness of breath Dermatological: negative for rash Respiratory: negative for cough or wheezing Urologic: negative for hematuria Abdominal: negative for nausea, vomiting, diarrhea, bright red blood per rectum, melena, or hematemesis Neurologic: negative for visual changes, syncope, or dizziness All other systems reviewed and are otherwise negative except as noted above.    Blood pressure 134/82, pulse 77, height 5\' 6"  (1.676 m), weight 145 lb 3.2 oz (65.9 kg), last menstrual period 06/17/2009, SpO2 98%.  General appearance: alert and no distress Neck: no adenopathy, no carotid bruit, no JVD, supple, symmetrical, trachea midline, and thyroid not enlarged, symmetric, no tenderness/mass/nodules Lungs: clear to auscultation bilaterally Heart: regular rate and rhythm, S1, S2 normal, no murmur, click, rub or gallop Extremities: extremities normal, atraumatic, no cyanosis or edema Pulses: 2+ and symmetric Skin: Skin color, texture, turgor normal. No rashes or lesions Neurologic: Grossly normal  EKG EKG Interpretation Date/Time:  Monday March 10 2024 08:08:41 EDT Ventricular Rate:  77 PR Interval:  144 QRS Duration:  78 QT Interval:  400 QTC Calculation: 452 R Axis:   76  Text Interpretation: Normal sinus rhythm Septal infarct , age undetermined No previous ECGs available Confirmed by Nanetta Batty [phone]) on 03/10/2024 8:20:41 AM    ASSESSMENT AND PLAN:   Hyperlipidemia History of hyperlipidemia on statin therapy and Zetia with lipid profile performed 03/23/23 revealing total cholesterol of 151, LDL of 62 HDL 72.  PAROXYSMAL ATRIAL TACHYCARDIA History of PAT on diltiazem and beta-blocker.  Her last event monitor was performed 11/08/2022 showed several episodes of SVT and occasional PACs and PVCs.  These have improved since limiting her caffeine intake.  They  are also somewhat driven by anxiety and stress and occur mostly at night.  She says that the are somewhat affecting her quality of life.  I offered her an evaluation by EP but she wishes to defer at the current time.  Hypertension History of essential hypertension her blood pressure measured today at 134/82.  She is on metoprolol and diltiazem.  Elevated coronary artery calcium score This was performed by Dr. Tenny Craw 05/07/2015 revealing a coronary calcium score of 8 with mild nonobstructive plaque in the proximal LAD.  She is asymptomatic.  Since has been 9 years since last study and going to repeat this to assess for progression.  She is at goal for secondary prevention.     Runell Gess MD FACP,FACC,FAHA, [facility] 03/10/2024 8:39 AM

## 2024-03-10 NOTE — Telephone Encounter (Signed)
 Pt calling in regards to results. Please advise

## 2024-03-10 NOTE — Assessment & Plan Note (Signed)
 This was performed by Dr. Tenny Craw 05/07/2015 revealing a coronary calcium score of 8 with mild nonobstructive plaque in the proximal LAD.  She is asymptomatic.  Since has been 9 years since last study and going to repeat this to assess for progression.  She is at goal for secondary prevention.

## 2024-03-23 ENCOUNTER — Encounter: Payer: Self-pay | Admitting: Internal Medicine

## 2024-03-23 NOTE — Assessment & Plan Note (Signed)
Chronic ?Ultrasound ordered to follow-up thyroid nodules ?Check TSH ?

## 2024-03-23 NOTE — Patient Instructions (Addendum)
      Blood work was ordered.       Medications changes include :   None     Return in about 1 year (around 03/24/2025) for follow up.

## 2024-03-23 NOTE — Progress Notes (Unsigned)
 Subjective:    Patient ID: Tiffany Moran, female    DOB: 12-22-1955, 68 y.o.   MRN: [id number]     HPI Tiffany Moran is here for follow up of her chronic medical problems.  Hands fall asleep when sleeping  ? Bug bite on leg  Venous insufficiency - wearing ted hose  Chronic RLQ pain - started one month ago - ? Getting better -  worse with bending over and squatting or getting out a car.    Medications and allergies reviewed with patient and updated if appropriate.  Current Outpatient Medications on File Prior to Visit  Medication Sig Dispense Refill   ALPRAZolam (XANAX) 0.5 MG tablet TAKE 1 TABLET BY MOUTH EVERY 8 HOURS AS NEEDED FOR ANXIETY 20 tablet 1   Cholecalciferol (VITAMIN D) 2000 units CAPS Take 2,000 Units by mouth daily.     diltiazem (CARDIZEM CD) 120 MG 24 hr capsule Take 1 capsule (120 mg total) by mouth daily. 90 capsule 0   ezetimibe (ZETIA) 10 MG tablet Take 0.5 tablets (5 mg total) by mouth daily. 45 tablet 1   metoprolol succinate (TOPROL-XL) 25 MG 24 hr tablet TAKE 1 TABLET BY MOUTH THREE TIMES DAILY (Patient taking differently: Take 25 mg by mouth 2 (two) times daily.) 270 tablet 0   rosuvastatin (CRESTOR) 20 MG tablet Take 1 tablet (20 mg total) by mouth daily. 90 tablet 0   No current facility-administered medications on file prior to visit.     Review of Systems  Constitutional:  Negative for fever.  Respiratory:  Negative for cough, shortness of breath and wheezing.   Cardiovascular:  Positive for palpitations. Negative for chest pain and leg swelling (tightness - no swelling).  Gastrointestinal:  Positive for abdominal pain (RLQ pain - intermittent) and constipation (mild). Negative for nausea.       No gerd  Neurological:  Positive for light-headedness (if gets up quick). Negative for headaches.       Objective:   Vitals:   03/24/24 0747  BP: 130/78  Pulse: 63  Temp: 98 F (36.7 C)  SpO2: 98%   BP Readings from Last 3 Encounters:  03/24/24  130/78  03/10/24 134/82  03/05/24 137/74   Wt Readings from Last 3 Encounters:  03/24/24 145 lb (65.8 kg)  03/10/24 145 lb 3.2 oz (65.9 kg)  03/05/24 148 lb 9.6 oz (67.4 kg)   Body mass index is 23.4 kg/m.    Physical Exam Constitutional:      General: She is not in acute distress.    Appearance: Normal appearance.  HENT:     Head: Normocephalic and atraumatic.     Right Ear: Tympanic membrane, ear canal and external ear normal. There is no impacted cerumen.     Left Ear: Tympanic membrane, ear canal and external ear normal. There is no impacted cerumen.  Eyes:     Conjunctiva/sclera: Conjunctivae normal.  Cardiovascular:     Rate and Rhythm: Normal rate and regular rhythm.     Heart sounds: Normal heart sounds.  Pulmonary:     Effort: Pulmonary effort is normal. No respiratory distress.     Breath sounds: Normal breath sounds. No wheezing.  Abdominal:     General: There is no distension.     Palpations: Abdomen is soft. There is no mass.     Tenderness: There is no abdominal tenderness. There is no guarding or rebound.     Hernia: No hernia is present.  Musculoskeletal:  Cervical back: Neck supple.     Right lower leg: No edema.     Left lower leg: No edema.  Lymphadenopathy:     Cervical: No cervical adenopathy.  Skin:    General: Skin is warm and dry.     Findings: No rash.  Neurological:     Mental Status: She is alert. Mental status is at baseline.  Psychiatric:        Mood and Affect: Mood normal.        Behavior: Behavior normal.        Lab Results  Component Value Date   WBC CANCELED 03/03/2024   HGB CANCELED 03/03/2024   HCT CANCELED 03/03/2024   PLT CANCELED 03/03/2024   GLUCOSE 94 03/05/2024   CHOL 151 03/23/2023   TRIG 84.0 03/23/2023   HDL 72.90 03/23/2023   LDLCALC 62 03/23/2023   ALT 33 (H) 10/24/2023   AST 29 10/24/2023   NA 142 03/05/2024   K 4.2 03/05/2024   CL 104 03/05/2024   CREATININE 0.85 03/05/2024   BUN 11 03/05/2024    CO2 24 03/05/2024   TSH 2.91 03/23/2023   HGBA1C 5.6 03/23/2023     Assessment & Plan:    See Problem List for Assessment and Plan of chronic medical problems.

## 2024-03-24 ENCOUNTER — Ambulatory Visit (INDEPENDENT_AMBULATORY_CARE_PROVIDER_SITE_OTHER): Payer: Medicare Other | Admitting: Internal Medicine

## 2024-03-24 VITALS — BP 130/78 | HR 63 | Temp 98.0°F | Ht 66.0 in | Wt 145.0 lb

## 2024-03-24 DIAGNOSIS — E559 Vitamin D deficiency, unspecified: Secondary | ICD-10-CM

## 2024-03-24 DIAGNOSIS — I471 Supraventricular tachycardia, unspecified: Secondary | ICD-10-CM

## 2024-03-24 DIAGNOSIS — R200 Anesthesia of skin: Secondary | ICD-10-CM | POA: Insufficient documentation

## 2024-03-24 DIAGNOSIS — E042 Nontoxic multinodular goiter: Secondary | ICD-10-CM

## 2024-03-24 DIAGNOSIS — M85852 Other specified disorders of bone density and structure, left thigh: Secondary | ICD-10-CM

## 2024-03-24 DIAGNOSIS — E782 Mixed hyperlipidemia: Secondary | ICD-10-CM | POA: Diagnosis not present

## 2024-03-24 DIAGNOSIS — E7841 Elevated Lipoprotein(a): Secondary | ICD-10-CM | POA: Insufficient documentation

## 2024-03-24 DIAGNOSIS — F419 Anxiety disorder, unspecified: Secondary | ICD-10-CM | POA: Diagnosis not present

## 2024-03-24 DIAGNOSIS — R1031 Right lower quadrant pain: Secondary | ICD-10-CM

## 2024-03-24 DIAGNOSIS — R739 Hyperglycemia, unspecified: Secondary | ICD-10-CM | POA: Diagnosis not present

## 2024-03-24 DIAGNOSIS — N200 Calculus of kidney: Secondary | ICD-10-CM | POA: Insufficient documentation

## 2024-03-24 DIAGNOSIS — I10 Essential (primary) hypertension: Secondary | ICD-10-CM | POA: Diagnosis not present

## 2024-03-24 LAB — LIPID PANEL
Cholesterol: 142 mg/dL (ref 0–200)
HDL: 69.8 mg/dL (ref >39.00)
LDL Cholesterol: 60 mg/dL (ref 0–99)
NonHDL: 72.04
Total CHOL/HDL Ratio: 2
Triglycerides: 61 mg/dL (ref 0.0–149.0)
VLDL: 12.2 mg/dL (ref 0.0–40.0)

## 2024-03-24 LAB — CBC WITH DIFFERENTIAL/PLATELET
Basophils Absolute: 0 10*3/uL (ref 0.0–0.1)
Basophils Relative: 0.3 % (ref 0.0–3.0)
Eosinophils Absolute: 0.1 10*3/uL (ref 0.0–0.7)
Eosinophils Relative: 2 % (ref 0.0–5.0)
HCT: 42.4 % (ref 36.0–46.0)
Hemoglobin: 14.4 g/dL (ref 12.0–15.0)
Lymphocytes Relative: 23.1 % (ref 12.0–46.0)
Lymphs Abs: 0.9 10*3/uL (ref 0.7–4.0)
MCHC: 33.9 g/dL (ref 30.0–36.0)
MCV: 90.8 fl (ref 78.0–100.0)
Monocytes Absolute: 0.3 10*3/uL (ref 0.1–1.0)
Monocytes Relative: 6.4 % (ref 3.0–12.0)
Neutro Abs: 2.8 10*3/uL (ref 1.4–7.7)
Neutrophils Relative %: 68.2 % (ref 43.0–77.0)
Platelets: 153 10*3/uL (ref 150.0–400.0)
RBC: 4.67 Mil/uL (ref 3.87–5.11)
RDW: 13 % (ref 11.5–15.5)
WBC: 4.1 10*3/uL (ref 4.0–10.5)

## 2024-03-24 LAB — TSH: TSH: 1.88 u[IU]/mL (ref 0.35–5.50)

## 2024-03-24 LAB — COMPREHENSIVE METABOLIC PANEL WITH GFR
ALT: 39 U/L — ABNORMAL HIGH (ref 0–35)
AST: 31 U/L (ref 0–37)
Albumin: 4.7 g/dL (ref 3.5–5.2)
Alkaline Phosphatase: 81 U/L (ref 39–117)
BUN: 14 mg/dL (ref 6–23)
CO2: 30 meq/L (ref 19–32)
Calcium: 9.9 mg/dL (ref 8.4–10.5)
Chloride: 104 meq/L (ref 96–112)
Creatinine, Ser: 0.76 mg/dL (ref 0.40–1.20)
GFR: 80.51 mL/min (ref >60.00)
Glucose, Bld: 89 mg/dL (ref 70–99)
Potassium: 4.5 meq/L (ref 3.5–5.1)
Sodium: 141 meq/L (ref 135–145)
Total Bilirubin: 0.8 mg/dL (ref 0.2–1.2)
Total Protein: 7.3 g/dL (ref 6.0–8.3)

## 2024-03-24 LAB — HEMOGLOBIN A1C: Hgb A1c MFr Bld: 5.5 % (ref 4.6–6.5)

## 2024-03-24 LAB — VITAMIN D 25 HYDROXY (VIT D DEFICIENCY, FRACTURES): VITD: 67.16 ng/mL (ref 30.00–100.00)

## 2024-03-24 NOTE — Assessment & Plan Note (Signed)
Chronic Intermittent, situational Continue alprazolam 0.5 mg every 8 hours as needed-does not take frequently

## 2024-03-24 NOTE — Assessment & Plan Note (Signed)
 Chronic Managed by cardiology Blood pressure well controlled CMP, cbc Continue diltiazem 120 mg daily, metoprolol XL 25 mg twice daily

## 2024-03-24 NOTE — Assessment & Plan Note (Signed)
 Chronic DEXA up-to-date Has severe osteopenia Stressed regular exercise Continue vitamin D daily Advised taking 500 mg of calcium a day-can get the rest of her diet

## 2024-03-24 NOTE — Assessment & Plan Note (Signed)
 Chronic Regular exercise and healthy diet encouraged Lipids controlled Managed by cardiology Continue crestor 20 mg daily, zetia 5 mg daily

## 2024-03-24 NOTE — Assessment & Plan Note (Signed)
 Chronic Lab Results  Component Value Date   HGBA1C 5.6 03/23/2023   Check a1c Low sugar / carb diet Stressed regular exercise

## 2024-03-24 NOTE — Assessment & Plan Note (Signed)
 Chronic Taking vitamin D daily Check vitamin D level

## 2024-03-24 NOTE — Assessment & Plan Note (Signed)
 Chronic Managed by cardiology-Dr. Allyson Sabal Controlled Continue diltiazem 120 mg daily, metoprolol XL 25 mg twice daily

## 2024-03-24 NOTE — Assessment & Plan Note (Addendum)
 Subacute - started one month ago More of a pressure CT ab/pelvis negative Saw gyn - no gyn related Worse with certain movements, changes in position Getting a little better ? Msk Give it more time - she will let me know if it is not getting better - can refer to sports med

## 2024-03-24 NOTE — Assessment & Plan Note (Signed)
 Subacute - has gotten worse When sleeping  ? B/l CTS, cervical in nature She will monitor - can evaluate further

## 2024-03-26 ENCOUNTER — Telehealth (HOSPITAL_BASED_OUTPATIENT_CLINIC_OR_DEPARTMENT_OTHER): Payer: Self-pay | Admitting: *Deleted

## 2024-03-26 ENCOUNTER — Encounter: Payer: Self-pay | Admitting: Internal Medicine

## 2024-03-26 NOTE — Telephone Encounter (Signed)
-----   Message from Jerene Bears sent at 03/22/2024 10:44 AM EDT ----- Please let pt know I have called and asked for a re-read of the liver and they will update the report if there is anything additional.  Last CT was in 2023 and the hemangioma was 1.5cm.  Very small.  These are not treated so unless there is something really significantly different, no additional recommendations will be made about a liver hemangioma.  Thank you.  MSM ----- Message ----- From: Harrie Jeans, RN Sent: 03/19/2024  10:42 AM EDT To: Jerene Bears, MD  I called pt with CT results. She is asking about a liver hemangioma that has been seen on her previous scans and feels that it may have been overlooked as it is not mentioned on this report. She doesn't feel that it would be gone now after being present for many years. Please advise. Thank you  Selena Batten

## 2024-03-26 NOTE — Telephone Encounter (Signed)
 Informed pt that provided has requested that the CT be re-read in regards to her concerns of previous liver hemangioma. Pt expresses appreciation.

## 2024-03-31 NOTE — Progress Notes (Deleted)
 Office Visit Note  Patient: Tiffany Moran             Date of Birth: [birth year]           MRN: [id number]             PCP: Colene Dauphin, MD Referring: Colene Dauphin, MD Visit Date: 04/14/2024 Occupation: @GUAROCC @  Subjective:  No chief complaint on file.   History of Present Illness: Tiffany Moran is a 68 y.o. female ***     Activities of Daily Living:  Patient reports morning stiffness for *** {minute/hour:19697}.   Patient {ACTIONS;DENIES/REPORTS:21021675::"Denies"} nocturnal pain.  Difficulty dressing/grooming: {ACTIONS;DENIES/REPORTS:21021675::"Denies"} Difficulty climbing stairs: {ACTIONS;DENIES/REPORTS:21021675::"Denies"} Difficulty getting out of chair: {ACTIONS;DENIES/REPORTS:21021675::"Denies"} Difficulty using hands for taps, buttons, cutlery, and/or writing: {ACTIONS;DENIES/REPORTS:21021675::"Denies"}  No Rheumatology ROS completed.   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   Bilateral hand numbness 03/24/2024   Elevated coronary artery calcium score 03/10/2024   RLQ abdominal pain 03/03/2024   Urethral prolapse 10/27/2023   Dysuria 10/27/2023   Constipation 03/23/2023   Hyperglycemia 03/09/2021   Osteoarthritis of right knee 03/08/2020   Hypertension 01/22/2019   Chronic sinusitis 09/11/2018   Arthritis of hand, degenerative 04/17/2018   Multiple thyroid nodules-stable, no follow-up needed 12/03/2017   Anxiety-situational 09/26/2017   Liver hemangioma 12/05/2016   Renal cyst, right 12/05/2016   Retinal hole of right eye 02/09/2016   Pes anserine bursitis 04/21/2014   SI (sacroiliac) joint dysfunction 04/21/2014   Osteopenia 08/04/2011   Vitamin D deficiency 08/04/2011   History of mitral valve prolapse 07/26/2010   PAROXYSMAL ATRIAL TACHYCARDIA 01/05/2010   Raynaud's syndrome 01/05/2010   Hyperlipidemia 08/17/2009   POLYARTHRITIS 08/17/2009    Past  Medical History:  Diagnosis Date   Cardiospasm 1983   felt may be related to MVP   Hyperlipidemia    Hypertension    Kidney cysts 11/2015   cyst on one of her kidneys, no treatment required   Mitral valve prolapse    PONV (postoperative nausea and vomiting)    teeth chipped   Raynaud disease    Retinal hole of right eye    Shingles 2012   leg   Tachycardia     Family History  Problem Relation Age of Onset   Heart attack Father 21   Myasthenia gravis Father    Hypertension Father    Hyperlipidemia Father    Osteoporosis Father        due to steroids for Myasthenia Grvis   Leukemia Mother 51   Hyperlipidemia Mother    Hypertension Mother    Hypothyroidism Mother    Prostate cancer Brother        in 52s   Hyperlipidemia Brother    Stroke Maternal Grandfather        in 65s   Healthy Son    Healthy Daughter    Diabetes Neg Hx    Past Surgical History:  Procedure Laterality Date   CATARACT EXTRACTION Left 03/04/2024   COLONOSCOPY  12/18/2005   negatve, [place] ,[place]   DILATATION & CURETTAGE/HYSTEROSCOPY WITH MYOSURE N/A 08/29/2016   Procedure: DILATATION & CURETTAGE/HYSTEROSCOPY;  Surgeon: Wanita Gutta, MD;  Location: [facility];  Service: Gynecology;  Laterality: N/A;   DILATION AND CURETTAGE OF UTERUS     EXCISION OF SKIN TAG Left 08/29/2016   Procedure: EXCISION OF SKIN TAG left labia majora;  Surgeon: Wanita Gutta, MD;  Location: [facility];  Service: Gynecology;  Laterality: Left;   EXPLORATORY LAPAROTOMY     X2 for pain: fibroid, enlarged oviduct   RETINAL LASER PROCEDURE Right 08/19/2015   SALPINGECTOMY  2007 or 2008   and fibroid removed   TUBAL LIGATION     Social History   Social History Narrative   Exercises regularly   Are you right handed or left handed? Right   Are you currently employed ? 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 husband   What type of home do you live in: 1  story or 2 story? two    Caffeine one a day   Immunization History  Administered Date(s) Administered   PFIZER(Purple Top)SARS-COV-2 Vaccination 01/23/2020, 02/13/2020   Tdap 04/03/2011, 05/15/2020     Objective: Vital Signs: LMP 06/17/2009 (Approximate)    Physical Exam   Musculoskeletal Exam: ***  CDAI Exam: CDAI Score: -- Patient Global: --; Provider Global: -- Swollen: --; Tender: -- Joint Exam 04/14/2024   No joint exam has been documented for this visit   There is currently no information documented on the homunculus. Go to the Rheumatology activity and complete the homunculus joint exam.  Investigation: No additional findings.  Imaging: CT ABDOMEN PELVIS W CONTRAST Result Date: 03/16/2024 CLINICAL DATA:  Right lower quadrant abdominal pain EXAM: CT ABDOMEN AND PELVIS WITH CONTRAST TECHNIQUE: Multidetector CT imaging of the abdomen and pelvis was performed using the standard protocol following bolus administration of intravenous contrast. RADIATION DOSE REDUCTION: This exam was performed according to the departmental dose-optimization program which includes automated exposure control, adjustment of the mA and/or kV according to patient size and/or use of iterative reconstruction technique. CONTRAST:  100mL OMNIPAQUE IOHEXOL 300 MG/ML  SOLN COMPARISON:  10/03/2022 FINDINGS: Lower chest: Lung bases are clear. Hepatobiliary: Liver is within normal limits. Gallbladder is unremarkable. No intrahepatic or extrahepatic ductal dilatation. Pancreas: Within normal limits. Spleen: Within normal limits. Adrenals/Urinary Tract: Adrenal glands are within normal limits. Left kidney is within normal limits. 3 mm nonobstructing right upper pole renal calculus (series 2/image 20). Multiple simple right renal cysts, measuring up to 6.4 cm in the lower kidney (series 2/image 33), benign (Bosniak I). No follow-up recommended. No hydronephrosis. Bladder is within normal limits. Stomach/Bowel: Stomach  is within normal limits. No evidence of bowel obstruction. Normal appendix (series 2/image 44). No colonic wall thickening or inflammatory changes. Mild to moderate colonic stool burden. Vascular/Lymphatic: No evidence of abdominal aortic aneurysm. Atherosclerotic calcifications of the abdominal aorta and branch vessels, although vessels remain patent. No suspicious abdominopelvic lymphadenopathy. Reproductive: Uterus and bilateral ovaries are within normal limits. Other: No abdominopelvic ascites. Musculoskeletal: Mild degenerative changes at L5-S1. IMPRESSION: 3 mm nonobstructing right upper pole renal calculus. No hydronephrosis. No CT findings to account for the patient's right lower quadrant abdominal pain. Normal appendix. Electronically Signed   By: Zadie Herter M.D.   On: 03/16/2024 13:26   US  PELVIC COMPLETE WITH TRANSVAGINAL Result Date: 03/08/2024 CLINICAL DATA:  RLQ pain  EXAM: TRANSVAGINAL ULTRASOUND OF PELVIS  TECHNIQUE: Transvaginal ultrasound examination of the pelvis was performed for imaging of the pelvis including uterus, ovaries, adnexal regions, and pelvic cul-de-sac.   FINDINGS: Uterus: 5.2 x 2.9 x 3.2cm.  Volume: 25.31ml  Endometrial thickness:  2.61mm  Right ovary:  1.6 x 2.2 x 1.6cm.  Volume:  2.60ml  Left ovary:  2.3 x 1.5 x 1.5cm.  Volume:  2.39ml  Other findings:  No abnormal free fluid.  Cervix WNL.   Recent Labs: Lab Results  Component Value Date   WBC 4.1 03/24/2024   HGB 14.4 03/24/2024   PLT 153.0 03/24/2024   NA 141 03/24/2024   K 4.5 03/24/2024   CL 104 03/24/2024   CO2 30 03/24/2024   GLUCOSE 89 03/24/2024   BUN 14 03/24/2024   CREATININE 0.76 03/24/2024   BILITOT 0.8 03/24/2024   ALKPHOS 81 03/24/2024   AST 31 03/24/2024   ALT 39 (H) 03/24/2024   PROT 7.3 03/24/2024   ALBUMIN 4.7 03/24/2024   CALCIUM 9.9 03/24/2024   GFRAA 84 11/05/2020    Speciality Comments: No specialty comments available.  Procedures:  No procedures performed Allergies:  Lipitor [atorvastatin]   Assessment / Plan:     Visit Diagnoses: No diagnosis found.  Orders: No orders of the defined types were placed in this encounter.  No orders of the defined types were placed in this encounter.   Face-to-face time spent with patient was *** minutes. Greater than 50% of time was spent in counseling and coordination of care.  Follow-Up Instructions: No follow-ups on file.   Dee Farber, CMA  Note - This record has been created using Animal nutritionist.  Chart creation errors have been sought, but may not always  have been located. Such creation errors do not reflect on  the standard of medical care.

## 2024-04-01 ENCOUNTER — Other Ambulatory Visit: Payer: Self-pay | Admitting: Internal Medicine

## 2024-04-14 ENCOUNTER — Ambulatory Visit: Payer: Medicare Other | Admitting: Rheumatology

## 2024-04-14 DIAGNOSIS — J328 Other chronic sinusitis: Secondary | ICD-10-CM

## 2024-04-14 DIAGNOSIS — M1711 Unilateral primary osteoarthritis, right knee: Secondary | ICD-10-CM

## 2024-04-14 DIAGNOSIS — M19041 Primary osteoarthritis, right hand: Secondary | ICD-10-CM

## 2024-04-14 DIAGNOSIS — M47816 Spondylosis without myelopathy or radiculopathy, lumbar region: Secondary | ICD-10-CM

## 2024-04-14 DIAGNOSIS — F419 Anxiety disorder, unspecified: Secondary | ICD-10-CM

## 2024-04-14 DIAGNOSIS — E785 Hyperlipidemia, unspecified: Secondary | ICD-10-CM

## 2024-04-14 DIAGNOSIS — I471 Supraventricular tachycardia, unspecified: Secondary | ICD-10-CM

## 2024-04-14 DIAGNOSIS — I7300 Raynaud's syndrome without gangrene: Secondary | ICD-10-CM

## 2024-04-14 DIAGNOSIS — Z8679 Personal history of other diseases of the circulatory system: Secondary | ICD-10-CM

## 2024-04-14 DIAGNOSIS — M810 Age-related osteoporosis without current pathological fracture: Secondary | ICD-10-CM

## 2024-04-14 DIAGNOSIS — M533 Sacrococcygeal disorders, not elsewhere classified: Secondary | ICD-10-CM

## 2024-04-14 DIAGNOSIS — E042 Nontoxic multinodular goiter: Secondary | ICD-10-CM

## 2024-04-14 DIAGNOSIS — H04123 Dry eye syndrome of bilateral lacrimal glands: Secondary | ICD-10-CM

## 2024-04-14 DIAGNOSIS — R739 Hyperglycemia, unspecified: Secondary | ICD-10-CM

## 2024-04-18 ENCOUNTER — Ambulatory Visit (HOSPITAL_BASED_OUTPATIENT_CLINIC_OR_DEPARTMENT_OTHER)
Admission: RE | Admit: 2024-04-18 | Discharge: 2024-04-18 | Disposition: A | Discharge status: Home / Self Care | Payer: Self-pay | Source: Ambulatory Visit | Attending: Cardiovascular Disease | Admitting: Cardiovascular Disease

## 2024-04-18 DIAGNOSIS — I10 Essential (primary) hypertension: Secondary | ICD-10-CM | POA: Insufficient documentation

## 2024-04-18 DIAGNOSIS — E782 Mixed hyperlipidemia: Secondary | ICD-10-CM | POA: Insufficient documentation

## 2024-05-13 ENCOUNTER — Other Ambulatory Visit: Payer: Self-pay | Admitting: Cardiovascular Disease

## 2024-05-23 ENCOUNTER — Other Ambulatory Visit: Payer: Self-pay | Admitting: Cardiovascular Disease

## 2024-06-04 ENCOUNTER — Ambulatory Visit: Payer: Self-pay

## 2024-06-04 ENCOUNTER — Encounter: Payer: Self-pay | Admitting: Internal Medicine

## 2024-06-04 MED ORDER — DOXYCYCLINE HYCLATE 100 MG PO TABS: 200.0000 mg | ORAL_TABLET | Freq: Once | ORAL | 0 refills | Status: AC

## 2024-06-04 NOTE — Telephone Encounter (Signed)
 FYI Only or Action Required?: FYI only for provider.  Patient was last seen in primary care on 03/24/2024 by Colene Dauphin, MD. Called Nurse Triage reporting Tick Removal. Symptoms began x 3 days ago. Interventions attempted: Nothing. Symptoms are: stable.  Triage Disposition: Home Care  Patient/caregiver understands and will follow disposition?: YES                 Copied from CRM [id number]. Topic: Clinical - Red Word Triage >> Jun 04, 2024  4:30 PM Dewanda Foots wrote: Red Word that prompted transfer to Nurse Triage: Tick Bite  Pt had a tick on her left ankle for 3 days. She removed the tick last night. She states she was supposed to get a call back by 4:30 today and has not heard from anyone.  Would like to speak to a nurse so she knows what to do. Reason for Disposition  Unknown type of tick bite with no complications  Answer Assessment - Initial Assessment Questions 1. ATTACHED:  Is the tick still on the skin?  (e.g., yes, no, unsure)      No   2. ONSET - TICK STILL ATTACHED:  How long do you think the tick has been on your skin? (e.g., hours, days, unsure)  Note:  Is there a recent activity (camping, hiking) where the caller may have been exposed?      She spotted tick on Monday, she removed the tick last night  4. LOCATION: Where is the tick bite located? (e.g., arm, leg)     Left ankle area.   5. TYPE of TICK: Is it a wood tick or a deer tick? (e.g., deer tick, wood tick; unsure)     Unsure 6. SIZE of TICK: How big is the tick? (e.g., size of poppy seed, apple seed, watermelon seed; unsure) Note: Deer ticks can be the size of a poppy seed (nymph) or an apple seed (adult).       Pin head  7. ENGORGED: Did the tick look flat or engorged (full, swollen)? (e.g., flat, engorged; unsure)     Unknown   8. OTHER SYMPTOMS: Do you have any other symptoms? (e.g., fever, rash, redness at bite area, red ring around bite)     No  Protocols used: Tick  Bite-A-AH

## 2024-06-04 NOTE — Telephone Encounter (Signed)
 Patient also sent mychart message and will be addressed by Dr. Donnette Gal

## 2024-07-08 ENCOUNTER — Other Ambulatory Visit: Payer: Self-pay | Admitting: Internal Medicine

## 2024-07-08 DIAGNOSIS — R002 Palpitations: Secondary | ICD-10-CM

## 2024-07-09 ENCOUNTER — Other Ambulatory Visit: Payer: Self-pay

## 2024-07-09 DIAGNOSIS — R002 Palpitations: Secondary | ICD-10-CM

## 2024-07-09 MED ORDER — METOPROLOL SUCCINATE ER 25 MG PO TB24: 25.0000 mg | ORAL_TABLET | Freq: Three times a day (TID) | ORAL | 2 refills | Status: AC

## 2024-08-10 ENCOUNTER — Encounter: Payer: Self-pay | Admitting: Internal Medicine

## 2024-08-10 NOTE — Progress Notes (Unsigned)
    Subjective:    Patient ID: Tiffany Moran, female    DOB: [birth year], 68 y.o.   MRN: [id number]      HPI Tiffany Moran is here for No chief complaint on file.   RLQ pain started in March.  Saw gyn.  Had Ct a/p 03/07/24 - no cause seen for pain.       Medications and allergies reviewed with patient and updated if appropriate.  Current Outpatient Medications on File Prior to Visit  Medication Sig Dispense Refill   ALPRAZolam  (XANAX ) 0.5 MG tablet TAKE 1 TABLET BY MOUTH EVERY 8 HOURS AS NEEDED FOR ANXIETY 20 tablet 1   Cholecalciferol (VITAMIN D ) 2000 units CAPS Take 2,000 Units by mouth daily.     diltiazem  (CARDIZEM  CD) 120 MG 24 hr capsule Take 1 capsule by mouth once daily 90 capsule 3   ezetimibe  (ZETIA ) 10 MG tablet Take 1/2 (one-half) tablet by mouth once daily 45 tablet 1   metoprolol  succinate (TOPROL -XL) 25 MG 24 hr tablet Take 1 tablet (25 mg total) by mouth 3 (three) times daily. 270 tablet 2   rosuvastatin  (CRESTOR ) 20 MG tablet Take 1 tablet by mouth once daily 90 tablet 3   No current facility-administered medications on file prior to visit.    Review of Systems     Objective:  There were no vitals filed for this visit. BP Readings from Last 3 Encounters:  03/24/24 130/78  03/10/24 134/82  03/05/24 137/74   Wt Readings from Last 3 Encounters:  03/24/24 145 lb (65.8 kg)  03/10/24 145 lb 3.2 oz (65.9 kg)  03/05/24 148 lb 9.6 oz (67.4 kg)   There is no height or weight on file to calculate BMI.    Physical Exam         Assessment & Plan:    See Problem List for Assessment and Plan of chronic medical problems.

## 2024-08-10 NOTE — Patient Instructions (Incomplete)
       A referral was ordered general surgery with [facility] and someone will call you to schedule an appointment.

## 2024-08-11 ENCOUNTER — Ambulatory Visit (INDEPENDENT_AMBULATORY_CARE_PROVIDER_SITE_OTHER): Admitting: Internal Medicine

## 2024-08-11 VITALS — BP 134/78 | HR 63 | Temp 97.9°F | Ht 66.0 in | Wt 143.0 lb

## 2024-08-11 DIAGNOSIS — F419 Anxiety disorder, unspecified: Secondary | ICD-10-CM | POA: Diagnosis not present

## 2024-08-11 DIAGNOSIS — R1031 Right lower quadrant pain: Secondary | ICD-10-CM | POA: Diagnosis not present

## 2024-08-11 DIAGNOSIS — I471 Supraventricular tachycardia, unspecified: Secondary | ICD-10-CM | POA: Diagnosis not present

## 2024-08-11 NOTE — Assessment & Plan Note (Signed)
 Chronic Managed by cardiology-Dr. Court Does have frequent palpitations Taking a Xanax  often helps, but does not necessarily stop the palpitations Continue diltiazem  120 mg daily, metoprolol  XL 25 mg twice daily Was advised by cardiology to take an extra metoprolol , but has not done that-advised trying half of metoprolol  to see if that helps-can always increase to 1 full pill

## 2024-08-11 NOTE — Assessment & Plan Note (Signed)
 Subacute Starting in February or March of this year Describes as a pressure that tends to come with certain movements, changes in position or activities No pain here today, but recently had discomfort after doing yard work a couple of days in a row No bulge GYN cause without CT abdomen pelvis negative ?  Inguinal hernia, less likely lumbar radiculopathy Referral to general surgery for their input

## 2024-08-11 NOTE — Assessment & Plan Note (Signed)
Chronic Intermittent, situational Continue alprazolam 0.5 mg every 8 hours as needed-does not take frequently

## 2024-08-14 ENCOUNTER — Encounter (HOSPITAL_BASED_OUTPATIENT_CLINIC_OR_DEPARTMENT_OTHER): Payer: Self-pay | Admitting: Obstetrics & Gynecology

## 2024-09-03 ENCOUNTER — Encounter: Payer: Self-pay | Admitting: Internal Medicine

## 2024-10-27 ENCOUNTER — Encounter (HOSPITAL_BASED_OUTPATIENT_CLINIC_OR_DEPARTMENT_OTHER): Payer: Self-pay | Admitting: Obstetrics & Gynecology

## 2024-10-27 ENCOUNTER — Ambulatory Visit (HOSPITAL_BASED_OUTPATIENT_CLINIC_OR_DEPARTMENT_OTHER): Payer: Medicare Other | Admitting: Obstetrics & Gynecology

## 2024-10-27 VITALS — BP 128/57 | HR 62 | Wt 145.2 lb

## 2024-10-27 DIAGNOSIS — Z1331 Encounter for screening for depression: Secondary | ICD-10-CM

## 2024-10-27 DIAGNOSIS — N952 Postmenopausal atrophic vaginitis: Secondary | ICD-10-CM | POA: Diagnosis not present

## 2024-10-27 DIAGNOSIS — Z01419 Encounter for gynecological examination (general) (routine) without abnormal findings: Secondary | ICD-10-CM | POA: Diagnosis not present

## 2024-10-27 DIAGNOSIS — R1031 Right lower quadrant pain: Secondary | ICD-10-CM

## 2024-10-27 DIAGNOSIS — N368 Other specified disorders of urethra: Secondary | ICD-10-CM | POA: Diagnosis not present

## 2024-10-27 DIAGNOSIS — M810 Age-related osteoporosis without current pathological fracture: Secondary | ICD-10-CM | POA: Diagnosis not present

## 2024-10-27 DIAGNOSIS — Z124 Encounter for screening for malignant neoplasm of cervix: Secondary | ICD-10-CM

## 2024-10-27 MED ORDER — ESTRADIOL 0.01 % VA CREA: TOPICAL_CREAM | VAGINAL | 3 refills | Status: AC

## 2024-10-27 NOTE — Progress Notes (Unsigned)
 Breast and Pelvic Exam Patient name: Tiffany Moran MRN [id number]  Date of birth: Oct 23, 1956 Chief Complaint:   Breast and Pelvic Exam  History of Present Illness:   Tiffany Moran is a 68 y.o. G65P2002 Caucasian female being seen today for breast and pelvic exam.  Denies vaginal bleeding.  H/O RLQ pain.  Had ultrasound and then CT.  She had a renal cyst.  She saw general surgeon and urologist.  Drainage of renal cyst was offered but the urologist wasn't sure this would help.  She decided not to do this right now.    She has noticed more hot flashes and some changes with belly fat.  She has questions about possible cortisol testing.    Also having some constipation.  We discussed some options.  She may try benefiber.    Patient's last menstrual period was 06/17/2009 (approximate).   Last pap 10/24/2023. Results were: NILM w/ HRHPV not done. H/O abnormal pap: yes. Last mammogram: 08/14/2024. Results were: abnormal, mass of the left breast. Patient also had a breast ultrasound done that was normal. Family h/o breast cancer: no Last colonoscopy: 12/10/2015. Results were: normal. Family h/o colorectal cancer: yes maternal grandmother. DEXA:  07/2023.       10/27/2024    9:59 AM 08/11/2024    8:13 AM 03/24/2024    7:52 AM 03/05/2024    2:35 PM 10/24/2023   10:46 AM  Depression screen PHQ 2/9  Decreased Interest 0 0 0 0 0  Down, Depressed, Hopeless 0 0 0 0 0  PHQ - 2 Score 0 0 0 0 0  Altered sleeping  0     Tired, decreased energy  0     Change in appetite  0     Feeling bad or failure about yourself   0     Trouble concentrating  0     Moving slowly or fidgety/restless  0     Suicidal thoughts  0     PHQ-9 Score  0      Difficult doing work/chores  Not difficult at all        Data saved with a previous flowsheet row definition        03/10/2021    1:46 PM  GAD 7 : Generalized Anxiety Score  Nervous, Anxious, on Edge 1  Control/stop worrying 1  Worry too much - different things 1   Trouble relaxing 0  Restless 0  Easily annoyed or irritable 0  Afraid - awful might happen 1  Total GAD 7 Score 4  Anxiety Difficulty Not difficult at all     Review of Systems:   Pertinent items are noted in HPI Denies any urinary issues.   Pertinent History Reviewed:  Reviewed past medical,surgical, social and family history.  Reviewed problem list, medications and allergies. Physical Assessment:   Vitals:   10/27/24 0951  BP: (!) 128/57  Pulse: 62  SpO2: 100%  Weight: 145 lb 3.2 oz (65.9 kg)  Body mass index is 23.44 kg/m.        Physical Examination:   General appearance - well appearing, and in no distress  Mental status - alert, oriented to person, place, and time  Psych:  She has a normal mood and affect  Skin - warm and dry, normal color, no suspicious lesions noted  Chest - effort normal, all lung fields clear to auscultation bilaterally  Heart - normal rate and regular rhythm  Neck:  midline trachea, no thyromegaly or nodules  Breasts - breasts appear normal, no suspicious masses, no skin or nipple changes or  axillary nodes  Abdomen - soft, nontender, nondistended, no masses or organomegaly  Pelvic - VULVA: normal appearing vulva with no masses, tenderness or lesions   VAGINA: normal appearing vagina with normal color and discharge, no lesions   CERVIX: normal appearing cervix without discharge or lesions, no CMT  Thin prep pap is not indicated today  UTERUS: uterus is felt to be normal size, shape, consistency and nontender   ADNEXA: No adnexal masses or tenderness noted.  Rectal - normal rectal, good sphincter tone, no masses felt.  Extremities:  No swelling or varicosities noted  Chaperone present for exam  No results found for this or any previous visit (from the past 24 hours).  Assessment & Plan:  1. Encntr for gyn exam (general) (routine) w/o abn findings (Primary) - Pap smear neg 2024.  Not indicated today. - Mammogram 08/14/2024 - Colonoscopy  12/10/2015 - Bone mineral density 07/2023 - lab work done with PCP, Dr. Geofm - vaccines reviewed/updated  2. Urethral prolapse - improved appearance.  Continue topical estrogen cream - estradiol  (ESTRACE ) 0.01 % CREA vaginal cream; Apply blueberry size amount to urethra twice weekly.  Dispense: 42.5 g; Refill: 3  3. Vaginal atrophy - discussed using vaginal estrogen cream 1 gram PV twice weekly as well  4. Age-related osteoporosis without current pathological fracture - did not desire treatment with last appointment but willing to repeat next year - taking Vit D 2000 international units daily and doing weight bearing exercise at least three times weekly  5. RLQ abdominal pain - has done ultrasound, CT and seen me, general surgery and urology.  At this point, she is aware I do not have additional recommendations regarding evaluation - improved bowel function with stool softeners/fiber discussed   No orders of the defined types were placed in this encounter.   Meds:  Meds ordered this encounter  Medications   estradiol  (ESTRACE ) 0.01 % CREA vaginal cream    Sig: Apply blueberry size amount to urethra twice weekly.    Dispense:  42.5 g    Refill:  3    Please file rx and she will call when she needs RF.    Follow-up: Return in about 1 year (around 10/27/2025).  Ronal GORMAN Pinal, MD 10/28/2024 6:43 AM

## 2024-10-28 DIAGNOSIS — M810 Age-related osteoporosis without current pathological fracture: Secondary | ICD-10-CM | POA: Insufficient documentation

## 2024-11-06 ENCOUNTER — Other Ambulatory Visit: Payer: Self-pay | Admitting: Cardiovascular Disease

## 2024-11-10 ENCOUNTER — Other Ambulatory Visit: Payer: Self-pay | Admitting: Internal Medicine

## 2024-11-10 ENCOUNTER — Telehealth: Payer: Self-pay | Admitting: Cardiovascular Disease

## 2024-11-10 MED ORDER — EZETIMIBE 10 MG PO TABS: 5.0000 mg | ORAL_TABLET | Freq: Every day | ORAL | 1 refills | Status: AC

## 2024-11-10 NOTE — Telephone Encounter (Signed)
 Pt's medication was sent to pt's pharmacy as requested. Confirmation received.

## 2024-11-10 NOTE — Telephone Encounter (Signed)
*  STAT* If patient is at the pharmacy, call can be transferred to refill team.   1. Which medications need to be refilled? (please list name of each medication and dose if known) ezetimibe  (ZETIA ) 10 MG tablet    2. Would you like to learn more about the convenience, safety, & potential cost savings by using the [facility]? No   3. Are you open to using the [facility] (Type [facility].) No   4. Which pharmacy/location (including street and city if local pharmacy) is medication to be sent to? [facility] [address], [place] - [address]    5. Do they need a 30 day or 90 day supply? 45 day

## 2024-11-20 NOTE — Progress Notes (Deleted)
 Office Visit Note  Patient: Tiffany Moran             Date of Birth: 1956/12/02           MRN: [id number]             PCP: Geofm Glade PARAS, MD Referring: Geofm Glade PARAS, MD Visit Date: 12/03/2024 Occupation: Data Unavailable  Subjective:  No chief complaint on file.   History of Present Illness: Tiffany Moran is a 68 y.o. female ***     Activities of Daily Living:  Patient reports morning stiffness for *** {minute/hour:19697}.   Patient {ACTIONS;DENIES/REPORTS:21021675::Denies} nocturnal pain.  Difficulty dressing/grooming: {ACTIONS;DENIES/REPORTS:21021675::Denies} Difficulty climbing stairs: {ACTIONS;DENIES/REPORTS:21021675::Denies} Difficulty getting out of chair: {ACTIONS;DENIES/REPORTS:21021675::Denies} Difficulty using hands for taps, buttons, cutlery, and/or writing: {ACTIONS;DENIES/REPORTS:21021675::Denies}  No Rheumatology ROS completed.   PMFS History:  Patient Active Problem List   Diagnosis Date Noted   Age-related osteoporosis without current pathological fracture 10/28/2024   Nephrolithiasis 03/24/2024   Elevated lipoprotein(a) 03/24/2024   Bilateral hand numbness 03/24/2024   Elevated coronary artery calcium  score 03/10/2024   RLQ abdominal pain 03/03/2024   Urethral prolapse 10/27/2023   Constipation 03/23/2023   Hyperglycemia 03/09/2021   Osteoarthritis of right knee 03/08/2020   Hypertension 01/22/2019   Chronic sinusitis 09/11/2018   Arthritis of hand, degenerative 04/17/2018   Multiple thyroid  nodules-stable, no follow-up needed 12/03/2017   Anxiety-situational 09/26/2017   Liver hemangioma 12/05/2016   Renal cyst, right 12/05/2016   Retinal hole of right eye 02/09/2016   Pes anserine bursitis 04/21/2014   SI (sacroiliac) joint dysfunction 04/21/2014   Vitamin D  deficiency 08/04/2011   History of mitral valve prolapse 07/26/2010   PAROXYSMAL ATRIAL TACHYCARDIA 01/05/2010   Raynaud's syndrome 01/05/2010   Hyperlipidemia 08/17/2009    POLYARTHRITIS 08/17/2009    Past Medical History:  Diagnosis Date   Cardiospasm 1983   felt may be related to MVP   Hyperlipidemia    Hypertension    Kidney cysts 11/2015   cyst on one of her kidneys, no treatment required   Mitral valve prolapse    PONV (postoperative nausea and vomiting)    teeth chipped   Raynaud disease    Retinal hole of right eye    Shingles 2012   leg   Tachycardia     Family History  Problem Relation Age of Onset   Heart attack Father 85   Myasthenia gravis Father    Hypertension Father    Hyperlipidemia Father    Osteoporosis Father        due to steroids for Myasthenia Grvis   Leukemia Mother 89   Hyperlipidemia Mother    Hypertension Mother    Hypothyroidism Mother    Prostate cancer Brother        in 68s   Hyperlipidemia Brother    Stroke Maternal Grandfather        in 74s   Healthy Son    Healthy Daughter    Diabetes Neg Hx    Past Surgical History:  Procedure Laterality Date   CATARACT EXTRACTION Bilateral 03/04/2024   COLONOSCOPY  12/18/2005   negatve, [place] ,[place]   DILATATION & CURETTAGE/HYSTEROSCOPY WITH MYOSURE N/A 08/29/2016   Procedure: DILATATION & CURETTAGE/HYSTEROSCOPY;  Surgeon: Kate Hargis Nearing, MD;  Location: [facility];  Service: Gynecology;  Laterality: N/A;   DILATION AND CURETTAGE OF UTERUS     EXCISION OF SKIN TAG Left 08/29/2016   Procedure: EXCISION OF SKIN TAG left labia majora;  Surgeon: Jill Evelyn Jertson, MD;  Location: [facility];  Service: Gynecology;  Laterality: Left;   EXPLORATORY LAPAROTOMY     X2 for pain: fibroid, enlarged oviduct   RETINAL LASER PROCEDURE Right 08/19/2015   SALPINGECTOMY  2007 or 2008   and fibroid removed   TUBAL LIGATION     Social History   Tobacco Use   Smoking status: Never    Passive exposure: Never   Smokeless tobacco: Never  Vaping Use   Vaping status: Never Used  Substance Use Topics   Alcohol use: No   Drug use: No   Social History   Social History Narrative    Exercises regularly   Are you right handed or left handed? Right   Are you currently employed ? 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 husband   What type of home do you live in: 1 story or 2 story? two    Caffeine one a day     Immunization History  Administered Date(s) Administered   PFIZER(Purple Top)SARS-COV-2 Vaccination 01/23/2020, 02/13/2020   Tdap 04/03/2011, 05/15/2020     Objective: Vital Signs: LMP 06/17/2009 (Approximate)    Physical Exam   Musculoskeletal Exam: ***  CDAI Exam: CDAI Score: -- Patient Global: --; Provider Global: -- Swollen: --; Tender: -- Joint Exam 12/03/2024   No joint exam has been documented for this visit   There is currently no information documented on the homunculus. Go to the Rheumatology activity and complete the homunculus joint exam.  Investigation: No additional findings.  Imaging: No results found.  Recent Labs: Lab Results  Component Value Date   WBC 4.1 03/24/2024   HGB 14.4 03/24/2024   PLT 153.0 03/24/2024   NA 141 03/24/2024   K 4.5 03/24/2024   CL 104 03/24/2024   CO2 30 03/24/2024   GLUCOSE 89 03/24/2024   BUN 14 03/24/2024   CREATININE 0.76 03/24/2024   BILITOT 0.8 03/24/2024   ALKPHOS 81 03/24/2024   AST 31 03/24/2024   ALT 39 (H) 03/24/2024   PROT 7.3 03/24/2024   ALBUMIN 4.7 03/24/2024   CALCIUM  9.9 03/24/2024   GFRAA 84 11/05/2020    Speciality Comments: No specialty comments available.  Procedures:  No procedures performed Allergies: Atorvastatin    Assessment / Plan:     Visit Diagnoses: No diagnosis found.  Orders: No orders of the defined types were placed in this encounter.  No orders of the defined types were placed in this encounter.   Face-to-face time spent with patient was *** minutes. Greater than 50% of time was spent in counseling and coordination of care.  Follow-Up Instructions: No follow-ups on  file.   Daved JAYSON Gavel, CMA  Note - This record has been created using Animal nutritionist.  Chart creation errors have been sought, but may not always  have been located. Such creation errors do not reflect on  the standard of medical care.

## 2024-12-03 ENCOUNTER — Ambulatory Visit: Admitting: Rheumatology

## 2024-12-03 DIAGNOSIS — E042 Nontoxic multinodular goiter: Secondary | ICD-10-CM

## 2024-12-03 DIAGNOSIS — M810 Age-related osteoporosis without current pathological fracture: Secondary | ICD-10-CM

## 2024-12-03 DIAGNOSIS — M47816 Spondylosis without myelopathy or radiculopathy, lumbar region: Secondary | ICD-10-CM

## 2024-12-03 DIAGNOSIS — M1711 Unilateral primary osteoarthritis, right knee: Secondary | ICD-10-CM

## 2024-12-03 DIAGNOSIS — I7300 Raynaud's syndrome without gangrene: Secondary | ICD-10-CM

## 2024-12-03 DIAGNOSIS — R739 Hyperglycemia, unspecified: Secondary | ICD-10-CM

## 2024-12-03 DIAGNOSIS — M533 Sacrococcygeal disorders, not elsewhere classified: Secondary | ICD-10-CM

## 2024-12-03 DIAGNOSIS — Z8679 Personal history of other diseases of the circulatory system: Secondary | ICD-10-CM

## 2024-12-03 DIAGNOSIS — M19042 Primary osteoarthritis, left hand: Secondary | ICD-10-CM

## 2024-12-03 DIAGNOSIS — J328 Other chronic sinusitis: Secondary | ICD-10-CM

## 2024-12-03 DIAGNOSIS — F419 Anxiety disorder, unspecified: Secondary | ICD-10-CM

## 2024-12-03 DIAGNOSIS — E785 Hyperlipidemia, unspecified: Secondary | ICD-10-CM

## 2024-12-03 DIAGNOSIS — H04123 Dry eye syndrome of bilateral lacrimal glands: Secondary | ICD-10-CM

## 2024-12-03 DIAGNOSIS — I471 Supraventricular tachycardia, unspecified: Secondary | ICD-10-CM

## 2025-01-13 NOTE — Progress Notes (Unsigned)
 "  Office Visit Note  Patient: Tiffany Moran             Date of Birth: [birth year]           MRN: [id number]             PCP: Geofm Glade PARAS, MD Referring: Geofm Glade PARAS, MD Visit Date: 01/27/2025 Occupation: Data Unavailable  Subjective:  No chief complaint on file.   History of Present Illness: Tiffany Moran is a 70 y.o. female ***     Activities of Daily Living:  Patient reports morning stiffness for *** {minute/hour:19697}.   Patient {ACTIONS;DENIES/REPORTS:21021675::Denies} nocturnal pain.  Difficulty dressing/grooming: {ACTIONS;DENIES/REPORTS:21021675::Denies} Difficulty climbing stairs: {ACTIONS;DENIES/REPORTS:21021675::Denies} Difficulty getting out of chair: {ACTIONS;DENIES/REPORTS:21021675::Denies} Difficulty using hands for taps, buttons, cutlery, and/or writing: {ACTIONS;DENIES/REPORTS:21021675::Denies}  No Rheumatology ROS completed.   PMFS History:  Patient Active Problem List   Diagnosis Date Noted   Age-related osteoporosis without current pathological fracture 10/28/2024   Nephrolithiasis 03/24/2024   Elevated lipoprotein(a) 03/24/2024   Bilateral hand numbness 03/24/2024   Elevated coronary artery calcium  score 03/10/2024   RLQ abdominal pain 03/03/2024   Urethral prolapse 10/27/2023   Constipation 03/23/2023   Hyperglycemia 03/09/2021   Osteoarthritis of right knee 03/08/2020   Hypertension 01/22/2019   Chronic sinusitis 09/11/2018   Arthritis of hand, degenerative 04/17/2018   Multiple thyroid  nodules-stable, no follow-up needed 12/03/2017   Anxiety-situational 09/26/2017   Liver hemangioma 12/05/2016   Renal cyst, right 12/05/2016   Retinal hole of right eye 02/09/2016   Pes anserine bursitis 04/21/2014   SI (sacroiliac) joint dysfunction 04/21/2014   Vitamin D  deficiency 08/04/2011   History of mitral valve prolapse 07/26/2010   PAROXYSMAL ATRIAL TACHYCARDIA 01/05/2010   Raynaud's syndrome 01/05/2010   Hyperlipidemia 08/17/2009    POLYARTHRITIS 08/17/2009    Past Medical History:  Diagnosis Date   Cardiospasm 1983   felt may be related to MVP   Hyperlipidemia    Hypertension    Kidney cysts 11/2015   cyst on one of her kidneys, no treatment required   Mitral valve prolapse    PONV (postoperative nausea and vomiting)    teeth chipped   Raynaud disease    Retinal hole of right eye    Shingles 2012   leg   Tachycardia     Family History  Problem Relation Age of Onset   Heart attack Father 35   Myasthenia gravis Father    Hypertension Father    Hyperlipidemia Father    Osteoporosis Father        due to steroids for Myasthenia Grvis   Leukemia Mother 56   Hyperlipidemia Mother    Hypertension Mother    Hypothyroidism Mother    Prostate cancer Brother        in 7s   Hyperlipidemia Brother    Stroke Maternal Grandfather        in 21s   Healthy Son    Healthy Daughter    Diabetes Neg Hx    Past Surgical History:  Procedure Laterality Date   CATARACT EXTRACTION Bilateral 03/04/2024   COLONOSCOPY  12/18/2005   negatve, [place] ,[place]   DILATATION & CURETTAGE/HYSTEROSCOPY WITH MYOSURE N/A 08/29/2016   Procedure: DILATATION & CURETTAGE/HYSTEROSCOPY;  Surgeon: Kate Hargis Nearing, MD;  Location: [facility];  Service: Gynecology;  Laterality: N/A;   DILATION AND CURETTAGE OF UTERUS     EXCISION OF SKIN TAG Left 08/29/2016   Procedure: EXCISION OF SKIN TAG left labia majora;  Surgeon: Jill Evelyn Jertson, MD;  Location: [facility];  Service: Gynecology;  Laterality: Left;   EXPLORATORY LAPAROTOMY     X2 for pain: fibroid, enlarged oviduct   RETINAL LASER PROCEDURE Right 08/19/2015   SALPINGECTOMY  2007 or 2008   and fibroid removed   TUBAL LIGATION     Social History[1] Social History   Social History Narrative   Exercises regularly   Are you right handed or left handed? Right   Are you currently employed ? 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 husband   What type of home do you live in: 1 story or 2 story? two    Caffeine one a day     Immunization History  Administered Date(s) Administered   PFIZER(Purple Top)SARS-COV-2 Vaccination 01/23/2020, 02/13/2020   Tdap 04/03/2011, 05/15/2020     Objective: Vital Signs: LMP 06/17/2009    Physical Exam   Musculoskeletal Exam: ***  CDAI Exam: CDAI Score: -- Patient Global: --; Provider Global: -- Swollen: --; Tender: -- Joint Exam 01/27/2025   No joint exam has been documented for this visit   There is currently no information documented on the homunculus. Go to the Rheumatology activity and complete the homunculus joint exam.  Investigation: No additional findings.  Imaging: No results found.  Recent Labs: Lab Results  Component Value Date   WBC 4.1 03/24/2024   HGB 14.4 03/24/2024   PLT 153.0 03/24/2024   NA 141 03/24/2024   K 4.5 03/24/2024   CL 104 03/24/2024   CO2 30 03/24/2024   GLUCOSE 89 03/24/2024   BUN 14 03/24/2024   CREATININE 0.76 03/24/2024   BILITOT 0.8 03/24/2024   ALKPHOS 81 03/24/2024   AST 31 03/24/2024   ALT 39 (H) 03/24/2024   PROT 7.3 03/24/2024   ALBUMIN 4.7 03/24/2024   CALCIUM  9.9 03/24/2024   GFRAA 84 11/05/2020    Speciality Comments: No specialty comments available.  Procedures:  No procedures performed Allergies: Atorvastatin    Assessment / Plan:     Visit Diagnoses: Raynaud's disease without gangrene  Dry eyes  Primary osteoarthritis of both hands  Primary osteoarthritis of right knee  SI (sacroiliac) joint dysfunction  Spondylosis of lumbar spine  Age-related osteoporosis without current pathological fracture  Vitamin D  deficiency  SVT (supraventricular tachycardia)  History of mitral valve prolapse  Primary hypertension  Dyslipidemia  Multiple thyroid  nodules-stable, no follow-up needed  Anxiety  Orders: No orders of the defined types were placed in this encounter.  No orders of  the defined types were placed in this encounter.   Face-to-face time spent with patient was *** minutes. Greater than 50% of time was spent in counseling and coordination of care.  Follow-Up Instructions: No follow-ups on file.   Maya Nash, MD  Note - This record has been created using Animal nutritionist.  Chart creation errors have been sought, but may not always  have been located. Such creation errors do not reflect on  the standard of medical care.    [1]  Social History Tobacco Use   Smoking status: Never    Passive exposure: Never   Smokeless tobacco: Never  Vaping Use   Vaping status: Never Used  Substance Use Topics   Alcohol use: No   Drug use: No   "

## 2025-01-15 ENCOUNTER — Telehealth: Payer: Self-pay | Admitting: Cardiovascular Disease

## 2025-01-15 NOTE — Telephone Encounter (Signed)
 This doesn't sound like anything to be concerned about. I suspect it will improve with time. Obviously I wouldn't recommend using those stocking again. Dr Court can follow with recommendations when he's back. thx

## 2025-01-15 NOTE — Telephone Encounter (Signed)
 Pt c/o swelling/edema: STAT if pt has developed SOB within 24 hours  If swelling, where is the swelling located?   Left calf discomfort  How much weight have you gained and in what time span?  No  Have you gained 2 pounds in a day or 5 pounds in a week? No  Do you have a log of your daily weights (if so, list)?   Are you currently taking a fluid pill?   No  Are you currently SOB?   No  Have you traveled recently in a car or plane for an extended period of time?   Patient stated she is having discomfort in her left calf after removing compression stocking.

## 2025-01-15 NOTE — Telephone Encounter (Signed)
 Patient states after removing her compression hose last night she noted discomfort in her left leg. She describes it feeling like a rubber band around her leg.  The discomfort is where the elastic of the band from the compression hose was, just below the knee and extends to the upper part of the calf.  Patient denies any redness. Patient reports she has negative Homan's sign. No pain/discomfort when walking. She does report trace edema to leg, though this may be normal for her she is not sure.  Patient would like to know if she should be concerned or if there is anything she needs to do about this.  Dr. Court is currently out of the office, will forward to DOD Dr. Wonda to review and advise.

## 2025-01-15 NOTE — Telephone Encounter (Signed)
 Shared response from Dr. Wonda with patient: This doesn't sound like anything to be concerned about. I suspect it will improve with time. Obviously I wouldn't recommend using those stocking again. Dr Court can follow with recommendations when he's back. thx      Reviewed ED precautions for DVT/PE with patient, she verbalized understanding. Patient states she will try an epsom salt bath or heating pad for relief.  Will forward to Dr. Court to review.

## 2025-01-27 ENCOUNTER — Ambulatory Visit: Admitting: Rheumatology

## 2025-01-27 DIAGNOSIS — M810 Age-related osteoporosis without current pathological fracture: Secondary | ICD-10-CM

## 2025-01-27 DIAGNOSIS — H04123 Dry eye syndrome of bilateral lacrimal glands: Secondary | ICD-10-CM

## 2025-01-27 DIAGNOSIS — E559 Vitamin D deficiency, unspecified: Secondary | ICD-10-CM

## 2025-01-27 DIAGNOSIS — E042 Nontoxic multinodular goiter: Secondary | ICD-10-CM

## 2025-01-27 DIAGNOSIS — I7300 Raynaud's syndrome without gangrene: Secondary | ICD-10-CM

## 2025-01-27 DIAGNOSIS — Z8679 Personal history of other diseases of the circulatory system: Secondary | ICD-10-CM

## 2025-01-27 DIAGNOSIS — M19041 Primary osteoarthritis, right hand: Secondary | ICD-10-CM

## 2025-01-27 DIAGNOSIS — F419 Anxiety disorder, unspecified: Secondary | ICD-10-CM

## 2025-01-27 DIAGNOSIS — M47816 Spondylosis without myelopathy or radiculopathy, lumbar region: Secondary | ICD-10-CM

## 2025-01-27 DIAGNOSIS — M533 Sacrococcygeal disorders, not elsewhere classified: Secondary | ICD-10-CM

## 2025-01-27 DIAGNOSIS — M1711 Unilateral primary osteoarthritis, right knee: Secondary | ICD-10-CM

## 2025-01-27 DIAGNOSIS — I471 Supraventricular tachycardia, unspecified: Secondary | ICD-10-CM

## 2025-01-27 DIAGNOSIS — E785 Hyperlipidemia, unspecified: Secondary | ICD-10-CM

## 2025-01-27 DIAGNOSIS — I10 Essential (primary) hypertension: Secondary | ICD-10-CM

## 2025-03-04 ENCOUNTER — Ambulatory Visit: Admitting: Cardiovascular Disease

## 2025-03-26 ENCOUNTER — Ambulatory Visit: Admitting: Internal Medicine
